# Patient Record
Sex: Female | Born: 1955 | Hispanic: No | Marital: Married | Smoking: Never smoker
Health system: Southern US, Community
[De-identification: ages and names within clinical notes are randomized; demographics above are authoritative.]

## PROBLEM LIST (undated history)

## (undated) DIAGNOSIS — K589 Irritable bowel syndrome without diarrhea: Secondary | ICD-10-CM

## (undated) DIAGNOSIS — Z87898 Personal history of other specified conditions: Secondary | ICD-10-CM

## (undated) DIAGNOSIS — R945 Abnormal results of liver function studies: Secondary | ICD-10-CM

## (undated) DIAGNOSIS — Z8639 Personal history of other endocrine, nutritional and metabolic disease: Secondary | ICD-10-CM

## (undated) DIAGNOSIS — R112 Nausea with vomiting, unspecified: Secondary | ICD-10-CM

## (undated) DIAGNOSIS — Z923 Personal history of irradiation: Secondary | ICD-10-CM

## (undated) DIAGNOSIS — Z8042 Family history of malignant neoplasm of prostate: Secondary | ICD-10-CM

## (undated) DIAGNOSIS — R7989 Other specified abnormal findings of blood chemistry: Secondary | ICD-10-CM

## (undated) DIAGNOSIS — T8859XA Other complications of anesthesia, initial encounter: Secondary | ICD-10-CM

## (undated) DIAGNOSIS — Z9889 Other specified postprocedural states: Secondary | ICD-10-CM

## (undated) DIAGNOSIS — Z8719 Personal history of other diseases of the digestive system: Secondary | ICD-10-CM

## (undated) DIAGNOSIS — Z8249 Family history of ischemic heart disease and other diseases of the circulatory system: Secondary | ICD-10-CM

## (undated) DIAGNOSIS — E890 Postprocedural hypothyroidism: Secondary | ICD-10-CM

## (undated) DIAGNOSIS — E7212 Methylenetetrahydrofolate reductase deficiency: Secondary | ICD-10-CM

## (undated) DIAGNOSIS — F419 Anxiety disorder, unspecified: Secondary | ICD-10-CM

## (undated) DIAGNOSIS — G43909 Migraine, unspecified, not intractable, without status migrainosus: Secondary | ICD-10-CM

## (undated) DIAGNOSIS — C801 Malignant (primary) neoplasm, unspecified: Secondary | ICD-10-CM

## (undated) DIAGNOSIS — Z1589 Genetic susceptibility to other disease: Secondary | ICD-10-CM

## (undated) DIAGNOSIS — M1990 Unspecified osteoarthritis, unspecified site: Secondary | ICD-10-CM

## (undated) DIAGNOSIS — G43019 Migraine without aura, intractable, without status migrainosus: Secondary | ICD-10-CM

## (undated) DIAGNOSIS — T4145XA Adverse effect of unspecified anesthetic, initial encounter: Secondary | ICD-10-CM

## (undated) DIAGNOSIS — Z85828 Personal history of other malignant neoplasm of skin: Secondary | ICD-10-CM

## (undated) DIAGNOSIS — Z8049 Family history of malignant neoplasm of other genital organs: Secondary | ICD-10-CM

## (undated) DIAGNOSIS — N950 Postmenopausal bleeding: Secondary | ICD-10-CM

## (undated) DIAGNOSIS — K5790 Diverticulosis of intestine, part unspecified, without perforation or abscess without bleeding: Secondary | ICD-10-CM

## (undated) HISTORY — DX: Migraine without aura, intractable, without status migrainosus: G43.019

## (undated) HISTORY — DX: Other specified abnormal findings of blood chemistry: R79.89

## (undated) HISTORY — PX: APPENDECTOMY: SHX54

## (undated) HISTORY — PX: OTHER SURGICAL HISTORY: SHX169

## (undated) HISTORY — DX: Family history of malignant neoplasm of other genital organs: Z80.49

## (undated) HISTORY — DX: Diverticulosis of intestine, part unspecified, without perforation or abscess without bleeding: K57.90

## (undated) HISTORY — PX: TUBAL LIGATION: SHX77

## (undated) HISTORY — DX: Abnormal results of liver function studies: R94.5

## (undated) HISTORY — DX: Methylenetetrahydrofolate reductase deficiency: E72.12

## (undated) HISTORY — DX: Family history of ischemic heart disease and other diseases of the circulatory system: Z82.49

## (undated) HISTORY — DX: Family history of malignant neoplasm of prostate: Z80.42

## (undated) HISTORY — PX: BREAST EXCISIONAL BIOPSY: SUR124

## (undated) HISTORY — DX: Unspecified osteoarthritis, unspecified site: M19.90

## (undated) HISTORY — DX: Genetic susceptibility to other disease: Z15.89

## (undated) HISTORY — DX: Irritable bowel syndrome, unspecified: K58.9

## (undated) HISTORY — DX: Anxiety disorder, unspecified: F41.9

---
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
## 1977-06-26 HISTORY — PX: BREAST EXCISIONAL BIOPSY: SUR124

## 1977-06-26 HISTORY — PX: BREAST BIOPSY: SHX20

## 1983-06-27 HISTORY — PX: OOPHORECTOMY: SHX86

## 1998-03-27 ENCOUNTER — Ambulatory Visit (HOSPITAL_COMMUNITY): Admission: RE | Admit: 1998-03-27 | Discharge: 1998-03-27 | Payer: Self-pay | Admitting: Neurosurgery

## 1998-03-27 ENCOUNTER — Encounter: Payer: Self-pay | Admitting: Neurosurgery

## 1999-04-12 ENCOUNTER — Other Ambulatory Visit: Admission: RE | Admit: 1999-04-12 | Discharge: 1999-04-12 | Payer: Self-pay | Admitting: *Deleted

## 2001-03-26 ENCOUNTER — Other Ambulatory Visit: Admission: RE | Admit: 2001-03-26 | Discharge: 2001-03-26 | Payer: Self-pay | Admitting: Obstetrics and Gynecology

## 2001-06-14 ENCOUNTER — Other Ambulatory Visit: Admission: RE | Admit: 2001-06-14 | Discharge: 2001-06-14 | Payer: Self-pay | Admitting: *Deleted

## 2002-06-06 ENCOUNTER — Other Ambulatory Visit: Admission: RE | Admit: 2002-06-06 | Discharge: 2002-06-06 | Payer: Self-pay | Admitting: Obstetrics and Gynecology

## 2002-07-23 ENCOUNTER — Encounter: Payer: Self-pay | Admitting: Family Medicine

## 2002-07-23 ENCOUNTER — Encounter: Admission: RE | Admit: 2002-07-23 | Discharge: 2002-07-23 | Payer: Self-pay | Admitting: Family Medicine

## 2002-12-18 ENCOUNTER — Encounter
Admission: RE | Admit: 2002-12-18 | Discharge: 2003-01-07 | Payer: Self-pay | Admitting: Physical Medicine and Rehabilitation

## 2003-03-26 ENCOUNTER — Ambulatory Visit (HOSPITAL_BASED_OUTPATIENT_CLINIC_OR_DEPARTMENT_OTHER): Admission: RE | Admit: 2003-03-26 | Discharge: 2003-03-26 | Payer: Self-pay | Admitting: Plastic Surgery

## 2003-03-26 ENCOUNTER — Ambulatory Visit (HOSPITAL_COMMUNITY): Admission: RE | Admit: 2003-03-26 | Discharge: 2003-03-26 | Payer: Self-pay | Admitting: Plastic Surgery

## 2003-03-26 ENCOUNTER — Encounter (INDEPENDENT_AMBULATORY_CARE_PROVIDER_SITE_OTHER): Payer: Self-pay | Admitting: Plastic Surgery

## 2003-04-21 ENCOUNTER — Other Ambulatory Visit: Admission: RE | Admit: 2003-04-21 | Discharge: 2003-04-21 | Payer: Self-pay | Admitting: Obstetrics and Gynecology

## 2004-03-30 ENCOUNTER — Encounter: Admission: RE | Admit: 2004-03-30 | Discharge: 2004-03-30 | Payer: Self-pay | Admitting: Family Medicine

## 2004-04-08 ENCOUNTER — Other Ambulatory Visit: Admission: RE | Admit: 2004-04-08 | Discharge: 2004-04-08 | Payer: Self-pay | Admitting: Obstetrics and Gynecology

## 2004-12-09 ENCOUNTER — Emergency Department (HOSPITAL_COMMUNITY): Admission: EM | Admit: 2004-12-09 | Discharge: 2004-12-09 | Payer: Self-pay | Admitting: Emergency Medicine

## 2005-01-18 ENCOUNTER — Encounter: Admission: RE | Admit: 2005-01-18 | Discharge: 2005-01-18 | Payer: Self-pay | Admitting: Unknown Physician Specialty

## 2005-02-21 ENCOUNTER — Ambulatory Visit: Payer: Self-pay | Admitting: Gastroenterology

## 2005-03-27 ENCOUNTER — Ambulatory Visit: Payer: Self-pay | Admitting: Gastroenterology

## 2005-04-03 ENCOUNTER — Ambulatory Visit: Payer: Self-pay | Admitting: Gastroenterology

## 2005-04-03 ENCOUNTER — Encounter (INDEPENDENT_AMBULATORY_CARE_PROVIDER_SITE_OTHER): Payer: Self-pay | Admitting: *Deleted

## 2005-04-06 ENCOUNTER — Ambulatory Visit (HOSPITAL_COMMUNITY): Admission: RE | Admit: 2005-04-06 | Discharge: 2005-04-06 | Payer: Self-pay | Admitting: Gastroenterology

## 2005-04-10 ENCOUNTER — Other Ambulatory Visit: Admission: RE | Admit: 2005-04-10 | Discharge: 2005-04-10 | Payer: Self-pay | Admitting: Obstetrics and Gynecology

## 2005-04-17 ENCOUNTER — Encounter: Admission: RE | Admit: 2005-04-17 | Discharge: 2005-04-17 | Payer: Self-pay | Admitting: Neurology

## 2005-04-21 ENCOUNTER — Ambulatory Visit: Payer: Self-pay | Admitting: Gastroenterology

## 2005-05-26 ENCOUNTER — Ambulatory Visit: Payer: Self-pay | Admitting: Gastroenterology

## 2005-06-28 ENCOUNTER — Encounter: Admission: RE | Admit: 2005-06-28 | Discharge: 2005-09-26 | Payer: Self-pay | Admitting: Neurology

## 2005-06-28 ENCOUNTER — Ambulatory Visit: Payer: Self-pay | Admitting: Psychology

## 2006-02-15 ENCOUNTER — Encounter: Admission: RE | Admit: 2006-02-15 | Discharge: 2006-02-15 | Payer: Self-pay | Admitting: Unknown Physician Specialty

## 2006-06-04 ENCOUNTER — Other Ambulatory Visit: Admission: RE | Admit: 2006-06-04 | Discharge: 2006-06-04 | Payer: Self-pay | Admitting: Obstetrics and Gynecology

## 2006-09-28 ENCOUNTER — Ambulatory Visit: Payer: Self-pay | Admitting: Gastroenterology

## 2006-09-28 LAB — CONVERTED CEMR LAB
Bacteria, UA: NEGATIVE
Crystals: NEGATIVE
Ketones, ur: NEGATIVE mg/dL
Leukocytes, UA: NEGATIVE
Mucus, UA: NEGATIVE
Urobilinogen, UA: 0.2 (ref 0.0–1.0)
WBC, UA: NONE SEEN cells/hpf

## 2007-04-26 ENCOUNTER — Other Ambulatory Visit: Admission: RE | Admit: 2007-04-26 | Discharge: 2007-04-26 | Payer: Self-pay | Admitting: Obstetrics and Gynecology

## 2008-05-01 ENCOUNTER — Other Ambulatory Visit: Admission: RE | Admit: 2008-05-01 | Discharge: 2008-05-01 | Payer: Self-pay | Admitting: Obstetrics and Gynecology

## 2009-05-26 HISTORY — PX: COLPOSCOPY: SHX161

## 2009-07-02 ENCOUNTER — Ambulatory Visit (HOSPITAL_BASED_OUTPATIENT_CLINIC_OR_DEPARTMENT_OTHER): Admission: RE | Admit: 2009-07-02 | Discharge: 2009-07-02 | Payer: Self-pay | Admitting: Family Medicine

## 2009-07-02 ENCOUNTER — Ambulatory Visit: Payer: Self-pay | Admitting: Interventional Radiology

## 2009-12-30 ENCOUNTER — Encounter
Admission: RE | Admit: 2009-12-30 | Discharge: 2010-03-21 | Payer: Self-pay | Admitting: Physical Medicine and Rehabilitation

## 2010-11-11 NOTE — Op Note (Signed)
   Kimberly Miles, Kimberly Miles                         ACCOUNT NO.:  [id number]   MEDICAL RECORD NO.:  [id number]                   PATIENT TYPE:  AMB   LOCATION:  DSC                                  FACILITY:  [facility]   PHYSICIAN:  Alfredia Ferguson, M.D.               DATE OF BIRTH:  05-24-1956   DATE OF PROCEDURE:  DATE OF DISCHARGE:                                 OPERATIVE REPORT   PREOPERATIVE DIAGNOSIS:  A 5-mm biopsy proven, basal cell carcinoma right  paranasal area.   POSTOPERATIVE DIAGNOSIS:  A 5-mm biopsy proven, basal cell carcinoma right  paranasal area.   PROCEDURE:  Elliptical excision basal cell carcinoma of the right paranasal  area with a 2-mm margin.   SURGEON:  Alfredia Ferguson, M.D.   ANESTHESIA:  2% Xylocaine with 1:100,000 epinephrine.   INDICATIONS FOR SURGERY:  This is a 55 year old woman with a biopsy proven  basal cell carcinoma located at the right paranasal area.  This is just  above the alar crease as it meets the cheek.  The patient wishes to have  this removed to clear the margins. She understands that she will be trading  what she has for permanent and potentially unsightly scar.  It was noted  that she wishes to proceed with the operation.   DESCRIPTION OF SURGERY:  An elliptical skin mark was placed around the  lesion with 2-mm margins.  Local anesthesia was infiltrated with 2%  Xylocaine 1:100,000 epinephrine.  After waiting approximately 10 minutes,  the right side of the nose and cheek were prepped with Betadine and draped  with sterile drapes.  An elliptical excision down  to the level of the  subcutaneous tissue was carried out.  The specimen was passed off for  pathology.  The wound edges were undermined for a distance of several  millimeters in all directions.   An attempt to place a 5-0 Monocryl in the central portion of the incision in  orderto remove the tension was made.  It was difficult to get the skin edges  to align well with the  buried suture since the anterior-most portion of the  nasal skin did not advance very well and tended to turn in.  For that  reason, I opted to place multiple interrupted 6-0 nylon sutures. The wound  aligned nicely.  The area was cleansed, dried, and a light dressing was  applied.  The patient was discharged home in satisfactory condition.                                               Alfredia Ferguson, M.D.    WBB/MEDQ  D:  03/26/2003  T:  03/26/2003  Job:  [id number]   cc:   Venancio Poisson, M.D.

## 2010-11-11 NOTE — Op Note (Signed)
[name], Kimberly Miles             ACCOUNT NO.:  [id number]   MEDICAL RECORD NO.:  [id number]          PATIENT TYPE:  AMB   LOCATION:  ENDO                         FACILITY:  [facility]   PHYSICIAN:  Vania Rea. Jarold Motto, M.D. [facility] OF BIRTH:  March 05, 1956   DATE OF PROCEDURE:  04/06/2005  DATE OF DISCHARGE:  04/06/2005                                 OPERATIVE REPORT   PROCEDURE:  Esophageal manometry.   SURGEON:  Vania Rea. Jarold Motto, M.D. [facility]   Esophageal manometry was completed on April 06, 2005 results are as  follows:  1.  Upper esophageal sphincter -- there appears to be good coordination      between pharyngeal contraction and cricopharyngeal relaxation.  2.  Lower esophageal sphincter -- mean pressure is normal at 25 mmHg with      normal relaxation to swallowing.  3.  Motility pattern -- there are normal propagated peristaltic waves      throughout the length of the esophageus to wet and dry swallows.  Mean      amplitude of contractions is slightly decreased to 41 mmHg.   ASSESSMENT:  This is a normal manometry except for slightly decreased  esophageal peristaltic amplitude.   RECOMMENDATIONS:  We will continue treatment for acid reflux and consider  Reglan trial because of her reduced esophageal peristalsis amplitude.           ______________________________  Vania Rea. Jarold Motto, M.D. [facility]     DRP/MEDQ  D:  04/21/2005  T:  04/22/2005  Job:  [id number]

## 2010-11-11 NOTE — Assessment & Plan Note (Signed)
[facility]                         GASTROENTEROLOGY OFFICE NOTE   NAME:Kimberly Miles, Kimberly Miles                    MRN:          [id number]  DATE:09/28/2006                            DOB:          [birth year]    Kimberly Miles is having recurrent episodes of right upper quadrant pain and  associated nausea for several years.  Most recent episode was several  weeks ago when she saw Dr. Derrell Lolling and had an ultrasound on September 12, 2006  which showed gallbladder sludge and multiple small stones, but,  otherwise, was unremarkable.   The patient has a very strong family history of gallbladder disease and  gallstones in both parents and both grandparents.  She said she does  have [place] genetics.  She really denies upper GI complaints at  this time, but I have seen her in the past for chronic functional  constipation.  She last had colonoscopy in October 2006 which was  unremarkable.  She had esophageal manometry which showed some decreased  esophageal peristaltic amplitude, but, otherwise, was normal without  evidence of aperistalsis.   PHYSICAL EXAMINATION:  Weighs 118 pounds, blood pressure 118/68, pulse  84 and regular.  I could not appreciate stigmata of chronic liver disease.  Her abdominal exam was unremarkable without palpable hepatosplenomegaly,  masses or significant tenderness.  Bowel sounds were normal.   ASSESSMENT:  Kimberly Miles has multiple symptomatic gallstones and needs  laparoscopic cholecystectomy which she has refused at this time.   RECOMMENDATIONS:  I placed her on Urso 250 mg a day.  Repeat her  ultrasound exam in three months time.  It is still my recommendation  that she have laparoscopic cholecystectomy.  I even showed her our  patient education movie on gallbladder disease and its management.  She  has elected to go as mentioned above, and I have explained to her that  is at high risk for having symptomatic cholecystitis or  cholangitis or  pancreatitis.     Kimberly Rea. Jarold Motto, MD, Caleen Essex, FAGA  Electronically Signed    DRP/MedQ  DD: 09/28/2006  DT: 09/28/2006  Job #: [id number]   cc:   Synetta Fail, M.D.

## 2010-12-07 ENCOUNTER — Other Ambulatory Visit: Payer: Self-pay | Admitting: Endocrinology

## 2010-12-07 DIAGNOSIS — R221 Localized swelling, mass and lump, neck: Secondary | ICD-10-CM

## 2010-12-09 ENCOUNTER — Ambulatory Visit
Admission: RE | Admit: 2010-12-09 | Discharge: 2010-12-09 | Disposition: A | Discharge status: Home / Self Care | Payer: BC Managed Care – PPO | Source: Ambulatory Visit | Attending: Endocrinology | Admitting: Endocrinology

## 2010-12-09 DIAGNOSIS — R221 Localized swelling, mass and lump, neck: Secondary | ICD-10-CM

## 2010-12-13 ENCOUNTER — Other Ambulatory Visit: Payer: Self-pay | Admitting: Endocrinology

## 2010-12-13 DIAGNOSIS — E041 Nontoxic single thyroid nodule: Secondary | ICD-10-CM

## 2010-12-20 ENCOUNTER — Ambulatory Visit
Admission: RE | Admit: 2010-12-20 | Discharge: 2010-12-20 | Disposition: A | Discharge status: Home / Self Care | Payer: BC Managed Care – PPO | Source: Ambulatory Visit | Attending: Endocrinology | Admitting: Endocrinology

## 2010-12-20 ENCOUNTER — Other Ambulatory Visit: Payer: Self-pay | Admitting: Interventional Radiology

## 2010-12-20 ENCOUNTER — Other Ambulatory Visit (HOSPITAL_COMMUNITY)
Admission: RE | Admit: 2010-12-20 | Discharge: 2010-12-20 | Disposition: A | Discharge status: Home / Self Care | Payer: BC Managed Care – PPO | Source: Ambulatory Visit | Attending: Interventional Radiology | Admitting: Interventional Radiology

## 2010-12-20 DIAGNOSIS — E041 Nontoxic single thyroid nodule: Secondary | ICD-10-CM

## 2010-12-20 DIAGNOSIS — E049 Nontoxic goiter, unspecified: Secondary | ICD-10-CM | POA: Insufficient documentation

## 2011-03-02 ENCOUNTER — Encounter (HOSPITAL_COMMUNITY)
Admission: RE | Admit: 2011-03-02 | Discharge: 2011-03-02 | Disposition: A | Discharge status: Home / Self Care | Payer: BC Managed Care – PPO | Source: Ambulatory Visit | Attending: Otolaryngology | Admitting: Otolaryngology

## 2011-03-02 LAB — URINALYSIS, ROUTINE W REFLEX MICROSCOPIC
Bilirubin Urine: NEGATIVE
Glucose, UA: NEGATIVE mg/dL
Hgb urine dipstick: NEGATIVE
Ketones, ur: NEGATIVE mg/dL
Leukocytes, UA: NEGATIVE
Nitrite: NEGATIVE
Protein, ur: NEGATIVE mg/dL
Specific Gravity, Urine: 1.011 (ref 1.005–1.030)
Urobilinogen, UA: 0.2 mg/dL (ref 0.0–1.0)
pH: 6 (ref 5.0–8.0)

## 2011-03-02 LAB — BASIC METABOLIC PANEL
BUN: 13 mg/dL (ref 6–23)
CO2: 32 mEq/L (ref 19–32)
Calcium: 9.9 mg/dL (ref 8.4–10.5)
Chloride: 104 mEq/L (ref 96–112)
Creatinine, Ser: 0.57 mg/dL (ref 0.50–1.10)
GFR calc Af Amer: >60 mL/min (ref >60)
GFR calc non Af Amer: >60 mL/min (ref >60)
Glucose, Bld: 82 mg/dL (ref 70–99)
Potassium: 5.1 mEq/L (ref 3.5–5.1)
Sodium: 140 mEq/L (ref 135–145)

## 2011-03-02 LAB — CBC
HCT: 41.7 % (ref 36.0–46.0)
Hemoglobin: 14.6 g/dL (ref 12.0–15.0)
MCH: 32.8 pg (ref 26.0–34.0)
MCHC: 35 g/dL (ref 30.0–36.0)
MCV: 93.7 fL (ref 78.0–100.0)
Platelets: 238 10*3/uL (ref 150–400)
RBC: 4.45 MIL/uL (ref 3.87–5.11)
RDW: 12.2 % (ref 11.5–15.5)
WBC: 6.8 10*3/uL (ref 4.0–10.5)

## 2011-03-02 LAB — SURGICAL PCR SCREEN
MRSA, PCR: NEGATIVE
Staphylococcus aureus: NEGATIVE

## 2011-03-02 LAB — HCG, SERUM, QUALITATIVE: Preg, Serum: NEGATIVE

## 2011-03-08 ENCOUNTER — Other Ambulatory Visit: Payer: Self-pay | Admitting: Otolaryngology

## 2011-03-08 ENCOUNTER — Ambulatory Visit (HOSPITAL_COMMUNITY)
Admission: RE | Admit: 2011-03-08 | Discharge: 2011-03-09 | Disposition: A | Discharge status: Home / Self Care | Payer: BC Managed Care – PPO | Source: Ambulatory Visit | Attending: Otolaryngology | Admitting: Otolaryngology

## 2011-03-08 DIAGNOSIS — D34 Benign neoplasm of thyroid gland: Secondary | ICD-10-CM | POA: Insufficient documentation

## 2011-03-08 DIAGNOSIS — K219 Gastro-esophageal reflux disease without esophagitis: Secondary | ICD-10-CM | POA: Insufficient documentation

## 2011-03-08 DIAGNOSIS — G43909 Migraine, unspecified, not intractable, without status migrainosus: Secondary | ICD-10-CM | POA: Insufficient documentation

## 2011-03-08 DIAGNOSIS — M1990 Unspecified osteoarthritis, unspecified site: Secondary | ICD-10-CM | POA: Insufficient documentation

## 2011-03-08 HISTORY — PX: THYROID LOBECTOMY: SHX420

## 2011-03-29 NOTE — Op Note (Signed)
  NAMEERANDY, MCEACHERN             ACCOUNT NO.:  [id number]  MEDICAL RECORD NO.:  [id number]  LOCATION:  SDS                          FACILITY:  [facility]  PHYSICIAN:  Meyer Dockery H. Pollyann Kennedy, MD     DATE OF BIRTH:  21-Mar-1956  DATE OF PROCEDURE:  03/08/2011 DATE OF DISCHARGE:  03/02/2011                              OPERATIVE REPORT   PREOPERATIVE DIAGNOSIS:  Right thyroid mass.  POSTOPERATIVE DIAGNOSIS:  Right thyroid mass.  PROCEDURE:  Right thyroid lobectomy.  SURGEON:  Kharlie Bring H. Pollyann Kennedy, MD  ASSISTANT:  Aquilla Hacker, [facility]  General endotracheal anesthesia was used.  No complications.  Blood loss minimal.  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  Permanent final diagnosis pending.  REFERRING PHYSICIAN:  Dorisann Frames, M.D.  HISTORY:  A 55 year old with a dominant nodule in the right thyroid lobe.  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  Risks, benefits, alternatives, complications of the procedure were explained to the patient, seemed understand, and agreed to surgery.  PROCEDURE:  The patient was taken to the operating room, placed in the operating table in supine position.  Following induction of general endotracheal anesthesia, the neck was prepped and draped in a standard fashion.  A low collar incision was outlined with a marking pen. Electrocautery was used to incise the subcutaneous tissue and through the platysma layer.  Subplatysmal flaps were developed superiorly to thyroid notch inferiorly to the clavicle.  A Weitlaner retractors was used throughout the case.  Midline fascia was divided and the strap muscles were reflected off the thyroid lobe laterally to the right side. The thyroid was retracted medially exposing the lateral capsule of the thyroid.  The superior vasculature was separately ligated between clamps and divided.  The middle thyroid vein was ligated between  clamps and divided as well.  4-0 silk ties were used throughout the case.  As the gland was brought forward a superior parathyroid was identified and preserved with its blood supply.  The recurrent nerve was identified and was left unmolested.  The inferior vasculature was then ligated between clamps and divided as well.  The ligament of Allyson Sabal was divided using electrocautery and silk ties as needed.  The isthmus was divided and sutured with chromic suture.  The specimen was delivered and sent for pathologic evaluation.  Frozen section as described above.  The wound was irrigated with bipolar cautery and additional ties were used as needed for completion of hemostasis.  The nerve was intact.  The superior parathyroid was healthy-looking.  A 7-French round JP drain was left in the wound, exited through the left-sided incision, secured in place with a nylon suture.  The midline fascia was reapproximated with chromic suture as was the platysma layer and the subcuticular layer.  Dermabond was used on the skin.  The patient was awakened, extubated, and transferred to recovery in stable condition.     Nil Bolser H. Pollyann Kennedy, MD     JHR/MEDQ  D:  03/08/2011  T:  03/08/2011  Job:  [id number]  cc:   Dorisann Frames, M.D.  Electronically Signed by Serena Colonel MD on 03/29/2011 09:31:03 PM

## 2011-05-09 HISTORY — PX: TRANSTHORACIC ECHOCARDIOGRAM: SHX275

## 2011-06-28 LAB — HM MAMMOGRAPHY: HM Mammogram: NEGATIVE

## 2011-06-29 ENCOUNTER — Telehealth: Payer: Self-pay | Admitting: Family Medicine

## 2011-07-27 ENCOUNTER — Encounter: Payer: Self-pay | Admitting: Family Medicine

## 2011-07-31 ENCOUNTER — Ambulatory Visit: Payer: BC Managed Care – PPO | Admitting: Family Medicine

## 2011-08-01 ENCOUNTER — Telehealth: Payer: Self-pay | Admitting: Family Medicine

## 2011-08-18 NOTE — Telephone Encounter (Signed)
Pt aware.

## 2012-07-16 ENCOUNTER — Other Ambulatory Visit: Payer: Self-pay | Admitting: Nurse Practitioner

## 2012-07-16 DIAGNOSIS — R748 Abnormal levels of other serum enzymes: Secondary | ICD-10-CM

## 2012-07-18 ENCOUNTER — Other Ambulatory Visit: Payer: Self-pay | Admitting: Dermatology

## 2012-07-22 ENCOUNTER — Ambulatory Visit
Admission: RE | Admit: 2012-07-22 | Discharge: 2012-07-22 | Disposition: A | Discharge status: Home / Self Care | Payer: BC Managed Care – PPO | Source: Ambulatory Visit | Attending: Nurse Practitioner | Admitting: Nurse Practitioner

## 2012-07-22 DIAGNOSIS — R748 Abnormal levels of other serum enzymes: Secondary | ICD-10-CM

## 2012-10-07 ENCOUNTER — Encounter: Payer: Self-pay | Admitting: *Deleted

## 2012-10-08 ENCOUNTER — Encounter: Payer: Self-pay | Admitting: Obstetrics and Gynecology

## 2012-10-08 ENCOUNTER — Ambulatory Visit (INDEPENDENT_AMBULATORY_CARE_PROVIDER_SITE_OTHER): Payer: BC Managed Care – PPO | Admitting: Obstetrics and Gynecology

## 2012-10-08 VITALS — BP 110/60 | Wt 118.0 lb

## 2012-10-08 DIAGNOSIS — N951 Menopausal and female climacteric states: Secondary | ICD-10-CM

## 2012-10-08 MED ORDER — ESTRADIOL ACETATE 0.05 MG/24HR VA RING: 1.0000 | VAGINAL_RING | VAGINAL | Status: DC

## 2012-10-08 NOTE — Patient Instructions (Signed)
We will call you in about a week with your results.  Then you should start the femring when I instruct you to.  Continue the prometrium every day.  Return here if you feel your symptoms don't improve on this therapy.  If they do, just return for your routine annual exam.

## 2012-10-08 NOTE — Progress Notes (Signed)
57 yo MWF G2P2 seen here August 28, 2012 for AnEx and had been started on HRT with estrogen pellets by Elie Goody for sx's of rage, low libido, fatigue, and hot flashes.  We spent quite a while then discussing the relative merits of alternative forms of HRT, and we ended up agreeing she would stop her current HRT, especially since it hadn't helped her sx/s anyway, and return here to check E2 level and consider FDA approved HRT.  We needed the E2 level because she had E2 pellets inserted in mid January, and I wanted to wait until her levels were back to baseline before I started HRT.  Pt hasn't really noticed any changes in her symptoms.  Still short tempered.  Pt feels HRT did indeed help calm the rages, and wants to go back on it at least for that.  Also helped hot flashes and helped her sleep better at night.  The HRT rx'd by Ms. Worrell was very expensive and the patient is eager to try something cheaper.    Previously used transdermal E2 but had a skin reaction and they came unstuck in her hot tub.  So pt wants to try femring 0.05 .  Will continue prometrium 100 mg as she is doing now.  Fully instructed.  Also asks for Vit D level today.  Taking 10,000 units qd of D3.  Rx for femring 0.05 mg q3months with 3 rf.  Continue prometrium.  Will call pt with lab results and instructions as to whether her E2 levels are down enough to start the femring.  Then ok for routine F/U.

## 2012-10-09 LAB — VITAMIN D 25 HYDROXY (VIT D DEFICIENCY, FRACTURES): Vit D, 25-Hydroxy: 73 ng/mL (ref 30–89)

## 2013-02-21 ENCOUNTER — Encounter: Payer: Self-pay | Admitting: Cardiovascular Disease

## 2013-02-21 ENCOUNTER — Ambulatory Visit (INDEPENDENT_AMBULATORY_CARE_PROVIDER_SITE_OTHER): Payer: BC Managed Care – PPO | Admitting: Cardiovascular Disease

## 2013-02-21 VITALS — BP 118/80 | HR 65 | Ht 59.0 in | Wt 117.4 lb

## 2013-02-21 DIAGNOSIS — R002 Palpitations: Secondary | ICD-10-CM

## 2013-02-21 DIAGNOSIS — Z8669 Personal history of other diseases of the nervous system and sense organs: Secondary | ICD-10-CM

## 2013-02-21 DIAGNOSIS — R946 Abnormal results of thyroid function studies: Secondary | ICD-10-CM | POA: Insufficient documentation

## 2013-02-21 DIAGNOSIS — E785 Hyperlipidemia, unspecified: Secondary | ICD-10-CM | POA: Insufficient documentation

## 2013-02-21 DIAGNOSIS — E782 Mixed hyperlipidemia: Secondary | ICD-10-CM

## 2013-02-21 MED ORDER — METOPROLOL TARTRATE 25 MG PO TABS: 25.0000 mg | ORAL_TABLET | ORAL | Status: DC | PRN

## 2013-02-21 NOTE — Patient Instructions (Signed)
Your physician has recommended that you wear a holter monitor. Holter monitors are medical devices that record the heart's electrical activity. Doctors most often use these monitors to diagnose arrhythmias. Arrhythmias are problems with the speed or rhythm of the heartbeat. The monitor is a small, portable device. You can wear one while you do your normal daily activities. This is usually used to diagnose what is causing palpitations/syncope (passing out).  Your physician recommends that you return for lab work fasting. You will not need an appointment. The lab opens at 8:00 a.m. They see patients on a first come , first serve basis.  Your physician has recommended you make the following change in your medication: START prescription for lopressor as directed on the bottle. This has already been sent to your pharmacy.  Your physician recommends that you schedule a follow-up appointment in: 3-4 weeks.

## 2013-02-21 NOTE — Progress Notes (Signed)
Patient ID: Kimberly Miles, female   DOB: 10/09/1955, 57 y.o.   MRN: [id number]     HPI: Kimberly Miles, is a 57 y.o. female who presents to the office today for one-year cardiology evaluation.  His [place] has a history of palpitations. 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 In the past she also has a history of elevation of liver function studies as well as hyperlipidemia. She apparently has been taking Armour Thyroid. Recently, she has noticed episodes of palpitations. These seem to have been occurring more frequently over the past 2 months. Often times she notes these in the morning after she wakes up. She describes them as skips but at times they can be several erosions. Alleviator mostly before noon. She drinks one cup of coffee. She denies associated chest pressure or presyncope or syncope. She has not had her thyroid function studies checked in some time. She presents for evaluation.  Past Medical History  Diagnosis Date  . Migraines 1998  . IBS (irritable bowel syndrome)   . Diverticulosis   . Colitis 1980    Past Surgical History  Procedure Laterality Date  . Breast biopsy  1979    benign  . Colposcopy  05/2009    CIN 1  . Cesarean section  1977, 1978  . Oophorectomy  1985    ruptured cyst   . Appendectomy    . Thyroidectomy, partial      Allergies  Allergen Reactions  . Ampicillin Rash    Current Outpatient Prescriptions  Medication Sig Dispense Refill  . 5-Methyltetrahydrofolate Calc POWD       . Estradiol Acetate 0.05 MG/24HR RING Place 1 each vaginally every 3 (three) months.  1 each  3  . IODINE, KELP, PO Apply 3 drops topically daily.      . magnesium oxide (MAG-OX) 400 MG tablet Take 400 mg by mouth daily.      . progesterone (PROMETRIUM) 100 MG capsule Take 100 mg by mouth daily.      . rizatriptan (MAXALT) 10 MG tablet Take 10 mg by mouth as  needed for migraine. May repeat in 2 hours if needed      . thyroid (ARMOUR) 90 MG tablet Take 90 mg by mouth daily.      . metoprolol tartrate (LOPRESSOR) 25 MG tablet Take 1 tablet (25 mg total) by mouth as needed.  30 tablet  3   No current facility-administered medications for this visit.    History   Social History  . Marital Status: Married    Spouse Name: N/A    Number of Children: N/A  . Years of Education: N/A   Occupational History  . Not on file.   Social History Main Topics  . Smoking status: Never Smoker   . Smokeless tobacco: Not on file  . Alcohol Use: 0.5 oz/week    1 drink(s) per week     Comment: occ alcohol wine  . Drug Use: No  . Sexual Activity: Yes    Partners: Male    Birth Control/ Protection: Post-menopausal   Other Topics Concern  . Not on file   Social History Narrative  . No narrative on file    Family history is notable that both parents are living. Mother has a history of lung cancer as well as hypertension hyperlipidemia and kidney issues. Father has heart disease.  ROS is negative  for fevers, chills or night sweats. She missed palpitations. She denies chest pressure. She denies wheezing. She does have migraines and takes Maxalt as needed. She denies abdominal pain. She denies change in bowel or bladder habits. She denies constipation nausea vomiting diarrhea. She denies myalgias. She denies paresthesias. Other system review is negative.  PE BP 118/80  Pulse 65  Ht 4\' 11"  (1.499 m)  Wt 117 lb 6.4 oz (53.252 kg)  BMI 23.7 kg/m2  LMP 08/25/2010  General: Alert, oriented, no distress.  Skin: normal turgor, no rashes HEENT: Normocephalic, atraumatic. Pupils round and reactive; sclera anicteric;no lid lag.  Nose without nasal septal hypertrophy Mouth/Parynx benign; Mallinpatti scale 2 Neck: No JVD, no carotid briuts Lungs: clear to ausculatation and percussion; no wheezing or rales Heart: RRR, s1 s2 normal; faint 1/6 systolic  murmur Abdomen: soft, nontender; no hepatosplenomehaly, BS+; abdominal aorta nontender and not dilated by palpation. Pulses 2+ Extremities: no clubbing cyanosis or edema, Homan's sign negative  Neurologic: grossly nonfocal  ECG: Sinus rhythm at 65 beats per minute. No ectopy. QTc interval 424 msec  LABS:  BMET    Component Value Date/Time   NA 140 03/02/2011 1102   K 5.1 03/02/2011 1102   CL 104 03/02/2011 1102   CO2 32 03/02/2011 1102   GLUCOSE 82 03/02/2011 1102   BUN 13 03/02/2011 1102   CREATININE 0.57 03/02/2011 1102   CALCIUM 9.9 03/02/2011 1102   GFRNONAA >60 03/02/2011 1102   GFRAA >60 03/02/2011 1102     Hepatic Function Panel  No results found for this basename: prot, albumin, ast, alt, alkphos, bilitot, bilidir, ibili     CBC    Component Value Date/Time   WBC 6.8 03/02/2011 1102   RBC 4.45 03/02/2011 1102   HGB 14.6 03/02/2011 1102   HCT 41.7 03/02/2011 1102   PLT 238 03/02/2011 1102   MCV 93.7 03/02/2011 1102   MCH 32.8 03/02/2011 1102   MCHC 35.0 03/02/2011 1102   RDW 12.2 03/02/2011 1102     BNP No results found for this basename: probnp    Lipid Panel  No results found for this basename: chol, trig, hdl, cholhdl, vldl, ldlcalc     RADIOLOGY: No results found.    ASSESSMENT AND PLAN: Ms. Arciniega is a very pleasant 57 year old female who does have a history of thyroid abnormality and is status post partial thyroidectomy and has been on thyroid replacement therapy early with Synthroid and most recently with Armour thyroid she does note recent increase in palpitations for the past 2 months. She denies associated syncope. Presently on checking a complete set of laboratory in the fasting state. We'll also check a magnesium as well as TSHT4 and T3. I'm scheduling her for 48 hour event monitor. I'm giving her a prescription for metoprolol tartrate 25 mg to take on an as-needed basis. She did have an echo Doppler study in November 2012 and this again was reviewed today. She did have  normal systolic and diastolic function at that time and had normal chamber dimensions. I will see her back in the office in followup of the above studies and further recommendations will be made at that time.     Lennette Bihari, MD, [facility]  02/21/2013 2:52 PM

## 2013-02-25 ENCOUNTER — Ambulatory Visit: Payer: BC Managed Care – PPO | Admitting: *Deleted

## 2013-02-25 DIAGNOSIS — R002 Palpitations: Secondary | ICD-10-CM

## 2013-02-25 LAB — COMPREHENSIVE METABOLIC PANEL
ALT: 41 U/L — ABNORMAL HIGH (ref 0–35)
AST: 21 U/L (ref 0–37)
Albumin: 4.5 g/dL (ref 3.5–5.2)
Alkaline Phosphatase: 80 U/L (ref 39–117)
Glucose, Bld: 89 mg/dL (ref 70–99)
Potassium: 4 mEq/L (ref 3.5–5.3)
Sodium: 139 mEq/L (ref 135–145)
Total Bilirubin: 0.6 mg/dL (ref 0.3–1.2)
Total Protein: 6.3 g/dL (ref 6.0–8.3)

## 2013-02-25 LAB — TSH: TSH: 0.169 u[IU]/mL — ABNORMAL LOW (ref 0.350–4.500)

## 2013-02-25 LAB — CBC
MCH: 31.7 pg (ref 26.0–34.0)
MCHC: 34.4 g/dL (ref 30.0–36.0)
MCV: 92 fL (ref 78.0–100.0)
Platelets: 242 10*3/uL (ref 150–400)
RBC: 4.61 MIL/uL (ref 3.87–5.11)
RDW: 13.6 % (ref 11.5–15.5)

## 2013-02-25 LAB — LIPID PANEL: Cholesterol: 219 mg/dL — ABNORMAL HIGH (ref 0–200)

## 2013-02-25 LAB — T3, FREE: T3, Free: 3 pg/mL (ref 2.3–4.2)

## 2013-02-25 LAB — MAGNESIUM: Magnesium: 2 mg/dL (ref 1.5–2.5)

## 2013-02-25 NOTE — Progress Notes (Signed)
Patient received a 48 hour holter monitor. Patient voiced understanding of verbal instructions given. Will follow up with Dr.Kelly as scheduled.

## 2013-03-10 ENCOUNTER — Encounter: Payer: Self-pay | Admitting: Cardiovascular Disease

## 2013-03-11 ENCOUNTER — Ambulatory Visit (INDEPENDENT_AMBULATORY_CARE_PROVIDER_SITE_OTHER): Payer: BC Managed Care – PPO | Admitting: Cardiovascular Disease

## 2013-03-11 ENCOUNTER — Encounter: Payer: Self-pay | Admitting: Cardiovascular Disease

## 2013-03-11 VITALS — BP 110/70 | HR 76 | Ht 59.0 in | Wt 119.0 lb

## 2013-03-11 DIAGNOSIS — R002 Palpitations: Secondary | ICD-10-CM

## 2013-03-11 DIAGNOSIS — E785 Hyperlipidemia, unspecified: Secondary | ICD-10-CM

## 2013-03-11 DIAGNOSIS — R946 Abnormal results of thyroid function studies: Secondary | ICD-10-CM

## 2013-03-11 MED ORDER — THYROID 60 MG PO TABS: 90.0000 mg | ORAL_TABLET | Freq: Every day | ORAL | Status: DC

## 2013-03-11 NOTE — Patient Instructions (Addendum)
Your physician has recommended you make the following change in your medication: reduce your thyroid medication as instructed then follow up with your PCP.  Your physician recommends that you schedule a follow-up appointment in: 6 MONTHS TO 1 YEAR.

## 2013-03-13 NOTE — Progress Notes (Signed)
Patient ID: Kimberly Miles, female   DOB: [birth year], 57 y.o.   MRN: [id number]     HPI: Kimberly Miles, is a 57 y.o. female who presents to the office today for followup cardiology evaluation. I last saw her approximately 3 weeks ago.  His Kuhnert has a history of palpitations. 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 In the past she also has a history of elevation of liver function studies as well as hyperlipidemia. She apparently has been taking Armour Thyroid. Recently, she has noticed episodes of palpitations. These seem to have been occurring more frequently over the past 2 months. Often times she notes these in the morning after she wakes up. She describes them as skips.  She drinks one cup of coffee. She denies associated chest pressure or presyncope or syncope. She has not had her thyroid function studies checked in some time.   When I saw her 3 weeks ago, I recommended that she discontinue her caffeine intact. Laboratory was checked which revealed a potassium of 4.0. She did have a BUN of 9 creatinine 0.69. Her ALT was minimally increased at 41 and 11 liver function studies were normal. Hemoglobin was 14.6 hematocrit 42.4. TSH was over suppressed at 0.169. Free T3 was 3.0 and free T4 0.83. She did wear a 48 hour Holter monitor which showed a minimum heart rate of 46, and average heart rate at 70 beats per minute and maximal heart rate of 129 beats per minute the she was noted to have occasional to frequent PVCs predominantly single but she did have episodes of bigeminy trigeminy and quadrigeminy. These seem most prominent she was sleeping the she denies any awareness of sleep apnea. She denies snoring. Recently, she has been taking Armour Thyroid at a dose of 90 mg she states in the past this had been at a dose of 60 mg but had been increased by Dr. Reita Chard at [facility].  Past Medical History  Diagnosis Date  . Migraines 1998  . IBS (irritable bowel syndrome)   . Diverticulosis   . Colitis 1980  . Palpitations     Past Surgical History  Procedure Laterality Date  . Breast biopsy  1979    benign  . Colposcopy  05/2009    CIN 1  . Cesarean section  1977, 1978  . Oophorectomy  1985    ruptured cyst   . Appendectomy    . Thyroidectomy, partial    . Myocardial perfusion study  04/07/2003    Normal Cardiolite   . 2d echocardiogram  05/09/2011    EF >55%, normal-mild    Allergies  Allergen Reactions  . Ampicillin Rash    Current Outpatient Prescriptions  Medication Sig Dispense Refill  . 5-Methyltetrahydrofolate Calc POWD       . Estradiol Acetate 0.05 MG/24HR RING Place 1 each vaginally every 3 (three) months.  1 each  3  . IODINE, KELP, PO Apply 3 drops topically daily.      . magnesium oxide (MAG-OX) 400 MG tablet Take 400 mg by mouth daily.      . metoprolol tartrate (LOPRESSOR) 25 MG tablet Take 1 tablet (25 mg total) by mouth as needed.  30 tablet  3  . progesterone (PROMETRIUM) 100 MG capsule Take 100 mg by mouth daily.      . rizatriptan (MAXALT) 10 MG tablet Take 10 mg  by mouth as needed for migraine. May repeat in 2 hours if needed      . thyroid (ARMOUR) 60 MG tablet Take 1.5 tablets (90 mg total) by mouth daily.  90 tablet  2   No current facility-administered medications for this visit.    History   Social History  . Marital Status: Married    Spouse Name: N/A    Number of Children: N/A  . Years of Education: N/A   Occupational History  . Not on file.   Social History Main Topics  . Smoking status: Never Smoker   . Smokeless tobacco: Not on file  . Alcohol Use: 0.5 oz/week    1 drink(s) per week     Comment: occ alcohol wine  . Drug Use: No  . Sexual Activity: Yes    Partners: Male    Birth Control/ Protection: Post-menopausal   Other Topics Concern  . Not on file   Social History Narrative  . No  narrative on file    Family history is notable that both parents are living. Mother has a history of lung cancer as well as hypertension hyperlipidemia and kidney issues. Father has heart disease.  ROS is negative for fevers, chills or night sweats. She missed palpitations. She denies chest pressure. She denies wheezing. She does have migraines and takes Maxalt as needed. She denies abdominal pain. She denies change in bowel or bladder habits. She denies constipation nausea vomiting diarrhea. She denies myalgias. She denies paresthesias. Other system review is negative.  PE BP 110/70  Pulse 76  Ht 4\' 11"  (1.499 m)  Wt 119 lb (53.978 kg)  BMI 24.02 kg/m2  LMP 08/25/2010  General: Alert, oriented, no distress.  Skin: normal turgor, no rashes HEENT: Normocephalic, atraumatic. Pupils round and reactive; sclera anicteric;no lid lag.  Nose without nasal septal hypertrophy Mouth/Parynx benign; Mallinpatti scale 2 Neck: No JVD, no carotid briuts Lungs: clear to ausculatation and percussion; no wheezing or rales Heart: RRR, s1 s2 normal; faint 1/6 systolic murmur Abdomen: soft, nontender; no hepatosplenomehaly, BS+; abdominal aorta nontender and not dilated by palpation. Pulses 2+ Extremities: no clubbing cyanosis or edema, Homan's sign negative  Neurologic: grossly nonfocal; no tremors Psychologic: Normal affect and mood.  ECG: Normal sinus rhythm at 76 beats per minute. PR interval 148 ms. QTc interval 441 milliseconds  LABS:  BMET    Component Value Date/Time   NA 139 02/25/2013 0938   K 4.0 02/25/2013 0938   CL 105 02/25/2013 0938   CO2 26 02/25/2013 0938   GLUCOSE 89 02/25/2013 0938   BUN 9 02/25/2013 0938   CREATININE 0.69 02/25/2013 0938   CREATININE 0.57 03/02/2011 1102   CALCIUM 9.2 02/25/2013 0938   GFRNONAA >60 03/02/2011 1102   GFRAA >60 03/02/2011 1102     Hepatic Function Panel     Component Value Date/Time   PROT 6.3 02/25/2013 0938     CBC    Component Value Date/Time   WBC  7.2 02/25/2013 0938   RBC 4.61 02/25/2013 0938   HGB 14.6 02/25/2013 0938   HCT 42.4 02/25/2013 0938   PLT 242 02/25/2013 0938   MCV 92.0 02/25/2013 0938   MCH 31.7 02/25/2013 0938   MCHC 34.4 02/25/2013 0938   RDW 13.6 02/25/2013 0938     BNP No results found for this basename: probnp    Lipid Panel     Component Value Date/Time   CHOL 219* 02/25/2013 0938     RADIOLOGY: No results  found.    ASSESSMENT AND PLAN: Ms. Geng is a very pleasant 57 year old female who does have a history of thyroid abnormality and is status post partial thyroidectomy and has been on thyroid replacement therapy.  An echo Doppler study in November 2012 revealed normal systolic and diastolic function at that time and had normal chamber dimensions. Her Holter monitor study demonstrates palpitations due to PVCs. These occurred in isolated, as well is in a bigeminal, trigeminal, and rarely a quadrigeminal pattern. She is over suppressed with reference to her thyroid function and has been taking Armour Thyroid 90 mg. In the past, she states that she had been on 60 mg but was increased to this 90 dose. I suggested that she reduce her thyroid replacement to 60 mg or impossible to release 75 mg. I recommended that she see Elie Goody for followup laboratory and further adjustment of her thyroid medication. I suspect this is contributing to her palpitations. She does have a prescription for metoprolol tartrate to take an as needed basis., I will see her in 6 months for followup cardiology evaluation.     Lennette Bihari, MD, [facility]  03/13/2013 5:34 PM

## 2013-04-28 ENCOUNTER — Telehealth: Payer: Self-pay | Admitting: Gynecology

## 2013-04-28 NOTE — Telephone Encounter (Signed)
Spoke with pt to advise on estring. Pt will try stopping it to see if it helps with palpitations. Pt reports she also is taking 100 mg of progesterone currently. Does she need to stop it if she discontinues the estring? If so, does she need to wean off?

## 2013-04-28 NOTE — Telephone Encounter (Signed)
.  Pt would like to talk with the nurse concerning her hormones

## 2013-04-28 NOTE — Telephone Encounter (Signed)
It looks like she is only on the estring as hormone, there is essentially no blood level so it should not cause her palpitations, but she can just take it out if she desires, there is no taper required

## 2013-04-28 NOTE — Telephone Encounter (Signed)
Spoke with patient. She is currently being followed by cardiology and having heart palpitations. She feels that it may be related to use of estrogen/progesterone as she was started on by Dr. Tresa Res in May 2014.   She would like to stop using Estradiol ring as she was supposed to replace ring on 04/26/13. She would like advise on how to taper off hormones as she feels they are r/t to her palpitations. She prefers not to wait until an office visit for instructions as she will be out of town and does not want to replace estradiol ring.

## 2013-04-29 NOTE — Telephone Encounter (Signed)
Pt advised re: stopping progesterone. Pt will call with any problems.

## 2013-04-29 NOTE — Telephone Encounter (Signed)
Can just stop that as well

## 2013-07-11 ENCOUNTER — Ambulatory Visit (INDEPENDENT_AMBULATORY_CARE_PROVIDER_SITE_OTHER): Payer: BC Managed Care – PPO | Admitting: Gynecology

## 2013-07-11 ENCOUNTER — Ambulatory Visit: Payer: Self-pay | Admitting: Obstetrics and Gynecology

## 2013-07-11 ENCOUNTER — Encounter: Payer: Self-pay | Admitting: Gynecology

## 2013-07-11 VITALS — BP 116/64 | HR 70 | Resp 18 | Ht 58.5 in | Wt 121.0 lb

## 2013-07-11 DIAGNOSIS — Z01419 Encounter for gynecological examination (general) (routine) without abnormal findings: Secondary | ICD-10-CM

## 2013-07-11 DIAGNOSIS — N951 Menopausal and female climacteric states: Secondary | ICD-10-CM

## 2013-07-11 DIAGNOSIS — Z124 Encounter for screening for malignant neoplasm of cervix: Secondary | ICD-10-CM

## 2013-07-11 DIAGNOSIS — Z13820 Encounter for screening for osteoporosis: Secondary | ICD-10-CM

## 2013-07-11 DIAGNOSIS — G43009 Migraine without aura, not intractable, without status migrainosus: Secondary | ICD-10-CM

## 2013-07-11 DIAGNOSIS — Z23 Encounter for immunization: Secondary | ICD-10-CM

## 2013-07-11 DIAGNOSIS — Z0000 Encounter for general adult medical examination without abnormal findings: Secondary | ICD-10-CM

## 2013-07-11 LAB — POCT URINALYSIS DIPSTICK
Blood, UA: 2
Leukocytes, UA: NEGATIVE
Urobilinogen, UA: NEGATIVE
pH, UA: 5

## 2013-07-11 LAB — HEMOGLOBIN, FINGERSTICK: Hemoglobin, fingerstick: 13.7 g/dL (ref 12.0–16.0)

## 2013-07-11 MED ORDER — PROMETHAZINE HCL 25 MG RE SUPP: 25.0000 mg | Freq: Four times a day (QID) | RECTAL | Status: DC | PRN

## 2013-07-11 MED ORDER — RIZATRIPTAN BENZOATE 10 MG PO TABS: 10.0000 mg | ORAL_TABLET | ORAL | Status: DC | PRN

## 2013-07-11 MED ORDER — ONDANSETRON 4 MG PO TBDP: 4.0000 mg | ORAL_TABLET | Freq: Three times a day (TID) | ORAL | Status: DC | PRN

## 2013-07-11 MED ORDER — CONJ ESTROGENS-BAZEDOXIFENE 0.45-20 MG PO TABS: 1.0000 | ORAL_TABLET | ORAL | Status: DC

## 2013-07-11 MED ORDER — ZOLPIDEM TARTRATE 5 MG PO TABS: 5.0000 mg | ORAL_TABLET | Freq: Every evening | ORAL | Status: DC | PRN

## 2013-07-11 NOTE — Patient Instructions (Signed)

## 2013-07-11 NOTE — Progress Notes (Signed)
58 y.o. Married Caucasian female   G2P2 here for annual exam. Pt reports menses are absent.  She does report hot flashes, does have night sweats, does have vaginal dryness.  She is using lubricants, OTC.  She does not report post-menopasual bleeding.  Pt stopped femring due to palpitations, stopped 04/26/13, symptoms did not resolve, but now with menopausal symptoms, hot flashes, night sweats and poor sleep.  Migraines are more frequent, lasting 2-3d each, sometimes associated with emesis.  Patient's last menstrual period was 08/25/2010.          Sexually active: yes  The current method of family planning is tubal ligation and post menopausal status.    Exercising: yes  walking, tennis Last pap: 07/06/10 Negative  Abnormal PAP: yes Mammogram: 12/19/12 Bi-Rads 1 BSE: yes Colonoscopy: over 10 years ago DEXA: 2008-osteopenic -1.7 Alcohol: no Tobacco: no   Hgb: 13.7 ; Urine: Blood 2  Health Maintenance  Topic Date Due  . Tetanus/tdap  08/21/1972  . Influenza Vaccine  01/24/2013  . Pap Smear  07/06/2013  . Colonoscopy  10/08/2014  . Mammogram  12/20/2014    Family History  Problem Relation Age of Onset  . Cancer Mother 12    Lung cancer  . Hypertension Mother 1  . Hyperlipidemia Mother 42  . Hypertension Maternal Grandmother   . Cancer Maternal Grandmother     Uterine cancer  . Vascular Disease Maternal Grandmother   . Stroke Maternal Grandfather   . Cancer Paternal Grandfather     Prostate cancer    Patient Active Problem List   Diagnosis Date Noted  . Palpitations 02/21/2013  . History of migraine headaches 02/21/2013  . Hyperlipidemia 02/21/2013  . Thyroid function study abnormality 02/21/2013    Past Medical History  Diagnosis Date  . Migraines 1998  . IBS (irritable bowel syndrome)   . Diverticulosis   . Colitis 1980  . Palpitations     Past Surgical History  Procedure Laterality Date  . Breast biopsy  1979    benign  . Colposcopy  05/2009    CIN 1  .  Cesarean section  1977, 1978  . Oophorectomy  1985    ruptured cyst   . Appendectomy    . Thyroidectomy, partial    . Myocardial perfusion study  04/07/2003    Normal Cardiolite   . 2d echocardiogram  05/09/2011    EF >55%, normal-mild    Allergies: Ampicillin  Current Outpatient Prescriptions  Medication Sig Dispense Refill  . 5-Methyltetrahydrofolate Calc POWD       . Estradiol Acetate 0.05 MG/24HR RING Place 1 each vaginally every 3 (three) months.  1 each  3  . IODINE, KELP, PO Apply 3 drops topically daily.      . magnesium oxide (MAG-OX) 400 MG tablet Take 400 mg by mouth daily.      . metoprolol tartrate (LOPRESSOR) 25 MG tablet Take 1 tablet (25 mg total) by mouth as needed.  30 tablet  3  . progesterone (PROMETRIUM) 100 MG capsule Take 100 mg by mouth daily.      . rizatriptan (MAXALT) 10 MG tablet Take 10 mg by mouth as needed for migraine. May repeat in 2 hours if needed      . thyroid (ARMOUR) 60 MG tablet Take 1.5 tablets (90 mg total) by mouth daily.  90 tablet  2   No current facility-administered medications for this visit.    ROS: Pertinent items are noted in HPI.  Exam:  BP 116/64  Pulse 70  Resp 18  Ht 4' 10.5" (1.486 m)  Wt 121 lb (54.885 kg)  BMI 24.86 kg/m2  LMP 08/25/2010 Weight change: @WEIGHTCHANGE @ Last 3 height recordings:  Ht Readings from Last 3 Encounters:  07/11/13 4' 10.5" (1.486 m)  03/11/13 4\' 11"  (1.499 m)  02/21/13 4\' 11"  (1.499 m)   General appearance: alert, cooperative and appears stated age Head: Normocephalic, without obvious abnormality, atraumatic Neck: no adenopathy, no carotid bruit, no JVD, supple, symmetrical, trachea midline and thyroid not enlarged, symmetric, no tenderness/mass/nodules Lungs: clear to auscultation bilaterally Breasts: normal appearance, no masses or tenderness Heart: regular rate and rhythm, S1, S2 normal, no murmur, click, rub or gallop Abdomen: soft, non-tender; bowel sounds normal; no masses,  no  organomegaly Extremities: extremities normal, atraumatic, no cyanosis or edema Skin: Skin color, texture, turgor normal. No rashes or lesions Lymph nodes: Cervical, supraclavicular, and axillary nodes normal. no inguinal nodes palpated Neurologic: Grossly normal   Pelvic: External genitalia:  no lesions              Urethra: normal appearing urethra with no masses, tenderness or lesions              Bartholins and Skenes: normal                 Vagina: normal appearing vagina with normal color and discharge, no lesions              Cervix: normal appearance              Pap taken: yes        Bimanual Exam:  Uterus:  uterus is normal size, shape, consistency and nontender                                      Adnexa:    normal adnexa in size, nontender and no masses                                      Rectovaginal: Confirms                                      Anus:  normal sphincter tone, no lesions  A: well woman no contraindication to continue hormonal therapy Migraine without aura but emesis Screening for osteoporosis     P: mammogram annual pap smear with HRHPV, guidelines reviewed DEXA Due for tetanus Reviewed findings of [facility] and nurses study-possible risks of CVD, benefits to bones, cholesterol possible decrease in breast and colon cancers, questions addressed, pt would like to try [facility] Medications for migraines and nausea given counseled on breast self exam, mammography screening, use and side effects of HRT, osteoporosis, adequate intake of calcium and vitamin D, diet and exercise return annually or prn Discussed PAP guideline changes, importance of weight bearing exercises, calcium, vit D and balanced diet.  An After Visit Summary was printed and given to the patient.

## 2013-07-14 MED ORDER — ZOLPIDEM TARTRATE 5 MG PO TABS: 5.0000 mg | ORAL_TABLET | Freq: Every evening | ORAL | Status: DC | PRN

## 2013-07-15 LAB — IPS PAP TEST WITH HPV

## 2013-09-01 ENCOUNTER — Encounter: Payer: Self-pay | Admitting: Cardiology

## 2013-09-01 ENCOUNTER — Ambulatory Visit (INDEPENDENT_AMBULATORY_CARE_PROVIDER_SITE_OTHER): Payer: BC Managed Care – PPO | Admitting: Cardiology

## 2013-09-01 VITALS — BP 100/62 | Ht 59.0 in | Wt 121.0 lb

## 2013-09-01 DIAGNOSIS — E785 Hyperlipidemia, unspecified: Secondary | ICD-10-CM

## 2013-09-01 DIAGNOSIS — R002 Palpitations: Secondary | ICD-10-CM

## 2013-09-01 DIAGNOSIS — R0989 Other specified symptoms and signs involving the circulatory and respiratory systems: Secondary | ICD-10-CM

## 2013-09-01 DIAGNOSIS — Z8249 Family history of ischemic heart disease and other diseases of the circulatory system: Secondary | ICD-10-CM | POA: Insufficient documentation

## 2013-09-01 HISTORY — DX: Family history of ischemic heart disease and other diseases of the circulatory system: Z82.49

## 2013-09-01 LAB — CBC
HCT: 43.7 % (ref 36.0–46.0)
Hemoglobin: 15 g/dL (ref 12.0–15.0)
MCH: 31.7 pg (ref 26.0–34.0)
MCHC: 34.3 g/dL (ref 30.0–36.0)
MCV: 92.4 fL (ref 78.0–100.0)
PLATELETS: 222 10*3/uL (ref 150–400)
RBC: 4.73 MIL/uL (ref 3.87–5.11)
RDW: 13.1 % (ref 11.5–15.5)
WBC: 5.8 10*3/uL (ref 4.0–10.5)

## 2013-09-01 LAB — BASIC METABOLIC PANEL
BUN: 13 mg/dL (ref 6–23)
CALCIUM: 9.5 mg/dL (ref 8.4–10.5)
CO2: 29 mEq/L (ref 19–32)
CREATININE: 0.63 mg/dL (ref 0.50–1.10)
Chloride: 102 mEq/L (ref 96–112)
Glucose, Bld: 87 mg/dL (ref 70–99)
Potassium: 4.7 mEq/L (ref 3.5–5.3)
Sodium: 138 mEq/L (ref 135–145)

## 2013-09-01 LAB — T4, FREE: FREE T4: 0.94 ng/dL (ref 0.80–1.80)

## 2013-09-01 LAB — TSH: TSH: 0.94 u[IU]/mL (ref 0.350–4.500)

## 2013-09-01 LAB — MAGNESIUM: MAGNESIUM: 2.1 mg/dL (ref 1.5–2.5)

## 2013-09-01 NOTE — Assessment & Plan Note (Signed)
Father with CABG at 65

## 2013-09-01 NOTE — Patient Instructions (Signed)
Exercise Stress Myoview, within 2 weeks  Lab work today  Keep follow up appt with Dr. Claiborne Billings

## 2013-09-01 NOTE — Assessment & Plan Note (Signed)
Continue, stopped stimulants.  With family hx CAD will do exercise myoview

## 2013-09-01 NOTE — Progress Notes (Signed)
09/01/2013   PCP: No PCP Per Patient   Chief Complaint  Patient presents with  . Follow-up    feeling like she cant get a deep breath in    Primary Cardiologist: Dr. Claiborne Billings  HPI:  58 year old WF followed by Dr. Claiborne Billings has a history of palpitations. 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 In the past she also has a history of elevation of liver function studies as well as hyperlipidemia. She apparently has been taking Armour Thyroid. Recently, she has noticed episodes of palpitations. These seem to have been occurring more frequently over the past 2 months. Often times she notes these in the morning after she wakes up. She describes them as skips. She did drink one cup of coffee daily. She denies associated chest pressure or presyncope or syncope.  Her thyroid meds were adjusted. She has stopped caffeine.  Her palpitations have returned and are bothersome.   She is concerned this could be related to CAD.  Her father had premature CAD.  Her last stress test was 2004.  No chest pain but does feel strange in her chest -most likely due to the irregular heart beat.  With holter she was found to have PVCs and PACs.    Allergies  Allergen Reactions  . Ampicillin Rash    Current Outpatient Prescriptions  Medication Sig Dispense Refill  . Cholecalciferol (VITAMIN D) 2000 UNITS CAPS Take by mouth.      Adalberto Ill Estrogens-Bazedoxifene (DUAVEE) 0.45-20 MG TABS Take 1 tablet by mouth 1 day or 1 dose.  30 tablet  12  . magnesium oxide (MAG-OX) 400 MG tablet Take 400 mg by mouth daily.      . ondansetron (ZOFRAN-ODT) 4 MG disintegrating tablet Take 1 tablet (4 mg total) by mouth every 8 (eight) hours as needed for nausea or vomiting.  20 tablet  0  . promethazine (PHENERGAN) 25 MG suppository Place 1 suppository (25 mg total) rectally every 6 (six) hours as needed for nausea or  vomiting.  12 each  0  . rizatriptan (MAXALT) 10 MG tablet Take 1 tablet (10 mg total) by mouth as needed for migraine. May repeat in 2 hours if needed  10 tablet  0  . thyroid (ARMOUR) 60 MG tablet Take 1.5 tablets (90 mg total) by mouth daily.  90 tablet  2  . zolpidem (AMBIEN) 5 MG tablet Take 1 tablet (5 mg total) by mouth at bedtime as needed for sleep.  30 tablet  0  . 5-Methyltetrahydrofolate Calc POWD       . Estradiol Acetate 0.05 MG/24HR RING Place 1 each vaginally every 3 (three) months.  1 each  3  . IODINE, KELP, PO Apply 3 drops topically daily.      . metoprolol tartrate (LOPRESSOR) 25 MG tablet Take 1 tablet (25 mg total) by mouth as needed.  30 tablet  3  . progesterone (PROMETRIUM) 100 MG capsule Take 100 mg by mouth daily.      . rizatriptan (MAXALT) 10 MG tablet Take 10 mg by mouth as needed for migraine. May repeat in 2 hours if needed       No current facility-administered medications for this visit.    Past Medical History  Diagnosis Date  . Migraines 1998  . IBS (irritable bowel syndrome)   . Diverticulosis   .  Colitis 1980  . Palpitations   . Family history of premature CAD 09/01/2013    Past Surgical History  Procedure Laterality Date  . Breast biopsy  1979    benign  . Colposcopy  05/2009    CIN 1  . Cesarean section  1977, 1978  . Oophorectomy  1985    ruptured cyst   . Appendectomy    . Thyroidectomy, partial    . Myocardial perfusion study  04/07/2003    Normal Cardiolite   . 2d echocardiogram  05/09/2011    EF >55%, normal-mild    NLG:XQJJHER:DE colds or fevers, no weight changes Skin:no rashes or ulcers HEENT:no blurred vision, no congestion CV:see HPI PUL:see HPI GI:no diarrhea constipation or melena, no indigestion GU:no hematuria, no dysuria MS:no joint pain, no claudication Neuro:no syncope, no lightheadedness Endo:no diabetes, no thyroid disease  PHYSICAL EXAM BP 100/62  Ht 4\' 11"  (1.499 m)  Wt 121 lb (54.885 kg)  BMI 24.43  kg/m2  LMP 08/25/2010 General:Pleasant affect, NAD Skin:Warm and dry, brisk capillary refill HEENT:normocephalic, sclera clear, mucus membranes moist Neck:supple, no JVD, no bruits  Heart:S1S2 RRR without murmur, gallup, rub or click, occ premature beat Lungs:clear without rales, rhonchi, or wheezes YCX:KGYJ, non tender, + BS, do not palpate liver spleen or masses Ext:no lower ext edema, 2+ pedal pulses, 2+ radial pulses Neuro:alert and oriented, MAE, follows commands, + facial symmetry  EKG:SR non specific ST changes  ASSESSMENT AND PLAN Palpitations Continue, stopped stimulants.  With family hx CAD will do exercise myoview  Family history of premature CAD Father with CABG at 45  Hyperlipidemia monitored  Will check her thyroid before visit with Dr. Claiborne Billings.  Keep next appt.

## 2013-09-01 NOTE — Assessment & Plan Note (Signed)
monitored

## 2013-09-12 ENCOUNTER — Telehealth (HOSPITAL_COMMUNITY): Payer: Self-pay

## 2013-09-16 ENCOUNTER — Ambulatory Visit (HOSPITAL_COMMUNITY)
Admission: RE | Admit: 2013-09-16 | Discharge: 2013-09-16 | Disposition: A | Discharge status: Home / Self Care | Payer: BC Managed Care – PPO | Source: Ambulatory Visit | Attending: Internal Medicine | Admitting: Internal Medicine

## 2013-09-16 DIAGNOSIS — R0989 Other specified symptoms and signs involving the circulatory and respiratory systems: Secondary | ICD-10-CM

## 2013-09-16 DIAGNOSIS — I498 Other specified cardiac arrhythmias: Secondary | ICD-10-CM | POA: Insufficient documentation

## 2013-09-16 DIAGNOSIS — I4949 Other premature depolarization: Secondary | ICD-10-CM

## 2013-09-16 DIAGNOSIS — Z8249 Family history of ischemic heart disease and other diseases of the circulatory system: Secondary | ICD-10-CM | POA: Insufficient documentation

## 2013-09-16 HISTORY — PX: CARDIOVASCULAR STRESS TEST: SHX262

## 2013-09-16 MED ORDER — TECHNETIUM TC 99M SESTAMIBI GENERIC - CARDIOLITE
30.1000 | Freq: Once | INTRAVENOUS | Status: AC | PRN
  Administered 2013-09-16: 30.1 via INTRAVENOUS

## 2013-09-16 MED ORDER — TECHNETIUM TC 99M SESTAMIBI GENERIC - CARDIOLITE
10.5000 | Freq: Once | INTRAVENOUS | Status: AC | PRN
  Administered 2013-09-16: 11 via INTRAVENOUS

## 2013-09-16 NOTE — Procedures (Addendum)
[facility] [address] [place] [address] [phone]  Cardiology Nuclear Med Study  Kimberly Miles is a 58 y.o. female     MRN : [id number]     DOB: 08/04/1955  Procedure Date: 09/16/2013  Nuclear Med Background Indication for Stress Test:  Evaluation for Ischemia History:  No prior cardiac or respiratory history reported. No prior NUC study for comparrison. Cardiac Risk Factors: Family History - CAD and Lipids  Symptoms:  Fatigue, Light-Headedness and Palpitations   Nuclear Pre-Procedure Caffeine/Decaff Intake:  1:00am NPO After: 11am   IV Site: R Forearm  IV 0.9% NS with Angio Cath:  22g  Chest Size (in):  n/a IV Started by: Azucena Cecil, RN  Height: 4\' 11"  (1.499 m)  Cup Size: DD  BMI:  Body mass index is 24.43 kg/(m^2). Weight:  121 lb (54.885 kg)   Tech Comments:  n/a    Nuclear Med Study 1 or 2 day study: 1 day  Stress Test Type:  Stress  Order Authorizing Provider:  Shelva Majestic, MD   Resting Radionuclide: Technetium 26m Sestamibi  Resting Radionuclide Dose: 10.5 mCi   Stress Radionuclide:  Technetium 65m Sestamibi  Stress Radionuclide Dose: 30.1 mCi           Stress Protocol Rest HR: 65 Stress HR: 162  Rest BP: 123/80 Stress BP: 184/90  Exercise Time (min): 8 METS: 10.1   Predicted Max HR: 160 bpm % Max HR: 101.25 bpm Rate Pressure Product: 29808  Dose of Adenosine (mg):  n/a Dose of Lexiscan: n/a mg  Dose of Atropine (mg): n/a Dose of Dobutamine: n/a mcg/kg/min (at max HR)  Stress Test Technologist: Leane Para, CCT Nuclear Technologist: Imagene Riches, CNMT   Rest Procedure:  Myocardial perfusion imaging was performed at rest 45 minutes following the intravenous administration of Technetium 47m Sestamibi. Stress Procedure:  The patient performed treadmill exercise using a Bruce  Protocol for 8 minutes. The patient stopped due to SOB and denied any chest pain.  There were no significant  ST-T wave changes.  Technetium 30m Sestamibi was injected at peak exercise and myocardial perfusion imaging was performed after a brief delay.  Transient Ischemic Dilatation (Normal <1.22):  0.90 Lung/Heart Ratio (Normal <0.45):  0.30 QGS EDV:  34 ml QGS ESV:  5 ml LV Ejection Fraction: 84%       Rest ECG: NSR - Normal EKG  Stress ECG: No significant change from baseline ECG  QPS Raw Data Images:  Normal; no motion artifact; normal heart/lung ratio. Stress Images:  Normal homogeneous uptake in all areas of the myocardium. Rest Images:  Normal homogeneous uptake in all areas of the myocardium. Subtraction (SDS):  No evidence of ischemia.  Impression Exercise Capacity:  Good exercise capacity. BP Response:  Normal blood pressure response. Clinical Symptoms:  No significant symptoms noted. ECG Impression:  No significant ST segment change suggestive of ischemia. Comparison with Prior Nuclear Study: No images to compare  Overall Impression:  Normal stress nuclear study.  LV Wall Motion:  NL LV Function; NL Wall Motion   Lorretta Harp, MD  09/17/2013 1:44 PM

## 2013-10-09 ENCOUNTER — Ambulatory Visit: Payer: BC Managed Care – PPO | Admitting: Cardiovascular Disease

## 2013-10-15 ENCOUNTER — Telehealth: Payer: Self-pay | Admitting: Gynecology

## 2013-10-15 DIAGNOSIS — N951 Menopausal and female climacteric states: Secondary | ICD-10-CM

## 2013-10-15 MED ORDER — CONJ ESTROGENS-BAZEDOXIFENE 0.45-20 MG PO TABS: 1.0000 | ORAL_TABLET | ORAL | Status: DC

## 2013-10-15 NOTE — Telephone Encounter (Signed)
Medication changed to 90 day supply.  Patient aware, very thankful.  Routing to provider for final review. Patient agreeable to disposition. Will close encounter

## 2013-10-15 NOTE — Telephone Encounter (Addendum)
Patient is calling saying her pharmacy said her rx for Conj Estrogens-Bazedoxifene (DUAVEE) 0.45-20 MG TABS  Needs to be in 90 days supply. She only has two pills left. Needs it to be done by tomorrow to get reimbursement on the higher price.

## 2013-11-11 ENCOUNTER — Other Ambulatory Visit: Payer: Self-pay | Admitting: Gynecology

## 2013-11-11 NOTE — Telephone Encounter (Signed)
eScribe request from [facility] for refill on MAXALT Last filled - 07/11/13, #10 X 0 Last AEX - 07/11/13 Next AEX - 07/13/14  Please advise refills.

## 2013-11-12 NOTE — Telephone Encounter (Signed)
Pt is requesting a refill for Maxalt. Pt wants to talk with the nurse first she states she has several issues such as prior authirization. Pt is leaving for [place] on Sunday and wants to get this resolved quickly.

## 2013-11-12 NOTE — Telephone Encounter (Signed)
Please advise if this is ok to refill-was given #[id number]/0RF on 07/11/13//kn

## 2013-11-27 ENCOUNTER — Ambulatory Visit: Payer: BC Managed Care – PPO | Admitting: Cardiovascular Disease

## 2014-01-07 NOTE — Telephone Encounter (Signed)
Encounter complete. 

## 2014-01-12 ENCOUNTER — Encounter: Payer: Self-pay | Admitting: Cardiovascular Disease

## 2014-01-12 ENCOUNTER — Ambulatory Visit (INDEPENDENT_AMBULATORY_CARE_PROVIDER_SITE_OTHER): Payer: BC Managed Care – PPO | Admitting: Cardiovascular Disease

## 2014-01-12 VITALS — BP 108/60 | HR 69 | Ht 59.0 in | Wt 115.7 lb

## 2014-01-12 DIAGNOSIS — Z9889 Other specified postprocedural states: Secondary | ICD-10-CM

## 2014-01-12 DIAGNOSIS — E890 Postprocedural hypothyroidism: Secondary | ICD-10-CM

## 2014-01-12 DIAGNOSIS — E785 Hyperlipidemia, unspecified: Secondary | ICD-10-CM

## 2014-01-12 DIAGNOSIS — Z8669 Personal history of other diseases of the nervous system and sense organs: Secondary | ICD-10-CM

## 2014-01-12 DIAGNOSIS — R002 Palpitations: Secondary | ICD-10-CM

## 2014-01-12 DIAGNOSIS — Z9009 Acquired absence of other part of head and neck: Secondary | ICD-10-CM

## 2014-01-12 NOTE — Patient Instructions (Signed)
Continue current medications.  Your physician recommends that you schedule a follow-up appointment in: One year with Dr. Claiborne Billings.

## 2014-01-12 NOTE — Progress Notes (Signed)
Patient ID: Kimberly Miles, female   DOB: 10-10-1955, 58 y.o.   MRN: [id number]     HPI: Kimberly Miles, is a 58 y.o. female who presents to the office today for 10 month followup cardiology evaluation.  Kimberly Miles has a history of palpitations. 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 In the past she also has a history of elevation of liver function studies as well as hyperlipidemia. When I saw her last year, she was having increased palpitations. I recommended that she discontinue her caffeine intact. Laboratory revealed normal renal function. Her ALT was minimally increased at 41 and 11 liver function studies were normal. Hemoglobin was 14.6 hematocrit 42.4. TSH was over suppressed at 0.169. Free T3 was 3.0 and free T4 0.83.  I recommended that she reduce her Armour Thyroid from 90 mg to 60 mg.  She wore a 48 hour Holter monitor which showed a minimum heart rate of 46, and average heart rate at 70 beats per minute and maximal heart rate of 129 beats per minute the she was noted to have occasional to frequent PVCs predominantly single but she did have episodes of bigeminy trigeminy and quadrigeminy. These seem most prominent she was sleeping the she denies any awareness of sleep apnea. She denies snoring.   Over the past year, she states another physician had increased her Armour Thyroid back to 90 mg.  She did not feel as well on this and actually has been taking a half of this presently, but now notes more fatigue.  She denies any recurrent prolonged palpitations.  There is no shortness of breath or chest pain.  She felt that she did not tolerate one dose of metoprolol that she had taken in the past and has not taken any metoprolol therapy for palpitations.  She underwent a nuclear perfusion study in March 2015 after she had seen Smiley Houseman in the office with vague chest sensation.  Her  nuclear study was normal and showed hyperdynamic LV function with post stress ejection fraction 84%.  There was normal perfusion.  Past Medical History  Diagnosis Date  . Migraines 1998  . IBS (irritable bowel syndrome)   . Diverticulosis   . Colitis 1980  . Palpitations   . Family history of premature CAD 09/01/2013    Past Surgical History  Procedure Laterality Date  . Breast biopsy  1979    benign  . Colposcopy  05/2009    CIN 1  . Cesarean section  1977, 1978  . Oophorectomy  1985    ruptured cyst   . Appendectomy    . Thyroidectomy, partial    . Myocardial perfusion study  04/07/2003    Normal Cardiolite   . 2d echocardiogram  05/09/2011    EF >55%, normal-mild    Allergies  Allergen Reactions  . Codeine   . Erythromycin   . Sulfa Antibiotics   . Ampicillin Rash    Current Outpatient Prescriptions  Medication Sig Dispense Refill  . Cholecalciferol (VITAMIN D) 2000 UNITS CAPS Take by mouth.      . magnesium oxide (MAG-OX) 400 MG tablet Take 400 mg by mouth daily.      Marland Kitchen OVER THE COUNTER MEDICATION Take 1 capsule by mouth every other day. Optimal Start      . rizatriptan (MAXALT) 10 MG tablet TAKE 1 TABLET (10 MG TOTAL) BY MOUTH AS NEEDED FOR MIGRAINE. MAY  REPEAT IN 2 HOURS IF NEEDED  10 tablet  0  . thyroid (ARMOUR) 60 MG tablet Take 1.5 tablets (90 mg total) by mouth daily.  90 tablet  2  . zolpidem (AMBIEN) 5 MG tablet Take 1 tablet (5 mg total) by mouth at bedtime as needed for sleep.  30 tablet  0   No current facility-administered medications for this visit.    History   Social History  . Marital Status: Married    Spouse Name: N/A    Number of Children: N/A  . Years of Education: N/A   Occupational History  . Not on file.   Social History Main Topics  . Smoking status: Never Smoker   . Smokeless tobacco: Not on file  . Alcohol Use: 0.5 oz/week    1 drink(s) per week     Comment: occ alcohol wine  . Drug Use: No  . Sexual Activity: Yes     Partners: Male    Birth Control/ Protection: Post-menopausal   Other Topics Concern  . Not on file   Social History Narrative  . No narrative on file    Family history is notable that both parents are living. Mother has a history of lung cancer as well as hypertension hyperlipidemia and kidney issues. Father has heart disease.  ROS General: Negative; No fevers, chills, or night sweats;  HEENT: Negative; No changes in vision or hearing, sinus congestion, difficulty swallowing Pulmonary: Negative; No cough, wheezing, shortness of breath, hemoptysis Cardiovascular: Negative; No chest pain, presyncope, syncope, palpitations GI: Negative; No nausea, vomiting, diarrhea, or abdominal pain GU: Negative; No dysuria, hematuria, or difficulty voiding Musculoskeletal: Negative; no myalgias, joint pain, or weakness Hematologic/Oncology: Positive for history of thyroidectomy secondary to follicular adenoma no easy bruising, bleeding Endocrine: Negative; no heat/cold intolerance; no diabetes Neuro: Positive for history of migraine headaches. no changes in balance, headaches Skin: Negative; No rashes or skin lesions Psychiatric: Negative; No behavioral problems, depression Sleep: Negative; No snoring, daytime sleepiness, hypersomnolence, bruxism, restless legs, hypnogognic hallucinations, no cataplexy Other comprehensive 14 point system review is negative.   PE BP 108/60  Pulse 69  Ht 4\' 11"  (1.499 m)  Wt 115 lb 11.2 oz (52.481 kg)  BMI 23.36 kg/m2  LMP 08/25/2010  General: Alert, oriented, no distress.  Skin: normal turgor, no rashes HEENT: Normocephalic, atraumatic. Pupils round and reactive; sclera anicteric;no lid lag.  Nose without nasal septal hypertrophy Mouth/Parynx benign; Mallinpatti scale 2 Neck: No JVD, no carotid bruits with normal carotid upstroke Lungs: clear to ausculatation and percussion; no wheezing or rales Heart: RRR, s1 s2 normal; faint 1/6 systolic murmur; no  diastolic murmur Abdomen: soft, nontender; no hepatosplenomehaly, BS+; abdominal aorta nontender and not dilated by palpation. Back: No CVA tenderness Pulses 2+ Extremities: no clubbing cyanosis or edema, Homan's sign negative  Neurologic: grossly nonfocal; no tremors Psychologic: Normal affect and mood.  ECG (independently read by me): Normal sinus rhythm at 69 beats per minute.  No ectopy.  September 2014 ECG: Normal sinus rhythm at 76 beats per minute. PR interval 148 Kimberly. QTc interval 441 milliseconds  LABS:  BMET    Component Value Date/Time   NA 138 09/01/2013 1100   K 4.7 09/01/2013 1100   CL 102 09/01/2013 1100   CO2 29 09/01/2013 1100   GLUCOSE 87 09/01/2013 1100   BUN 13 09/01/2013 1100   CREATININE 0.63 09/01/2013 1100   CREATININE 0.57 03/02/2011 1102   CALCIUM 9.5 09/01/2013 1100   GFRNONAA >60 03/02/2011 1102  GFRAA >60 03/02/2011 1102     Hepatic Function Panel     Component Value Date/Time   PROT 6.3 02/25/2013 0938     CBC    Component Value Date/Time   WBC 5.8 09/01/2013 1100   RBC 4.73 09/01/2013 1100   HGB 15.0 09/01/2013 1100   HCT 43.7 09/01/2013 1100   PLT 222 09/01/2013 1100   MCV 92.4 09/01/2013 1100   MCH 31.7 09/01/2013 1100   MCHC 34.3 09/01/2013 1100   RDW 13.1 09/01/2013 1100     BNP No results found for this basename: probnp    Lipid Panel     Component Value Date/Time   CHOL 219* 02/25/2013 0938     RADIOLOGY: No results found.    ASSESSMENT AND PLAN: Kimberly Miles is a very pleasant 58 year old female who is status post partial thyroidectomy due to the follicular adenoma and has been on thyroid replacement therapy.  An echo Doppler study in November 2012 revealed normal systolic and diastolic function at that time and had normal chamber dimensions. A Holter monitor study last year revealed  PVCs. These occurred in isolated, as well is in a bigeminal, trigeminal, and rarely a quadrigeminal pattern.  At that time, her TSH was over suppressed and I recommended a  reduction of her thyroid medication.  She had felt well on this and subsequent blood work in March 2015 showed a TSH that had improved and was now 0.9.  4.  Her free T4 was normal at 0.9.  4.  Apparently, since that time another physician had recommended that she increase her thyroid back to 90 mg, but apparently she now has only been taking 45 mg, which may explain some of her fatigability.  She is not having any ectopy.  I reviewed her nuclear study with her in detail, which was normal.  I will see her in one year for followup evaluation or sooner if problems arise.   Troy Sine, MD, [facility]  01/12/2014 2:41 PM

## 2014-01-14 ENCOUNTER — Encounter: Payer: Self-pay | Admitting: Cardiovascular Disease

## 2014-01-14 DIAGNOSIS — Z9009 Acquired absence of other part of head and neck: Secondary | ICD-10-CM | POA: Insufficient documentation

## 2014-01-14 DIAGNOSIS — E890 Postprocedural hypothyroidism: Secondary | ICD-10-CM | POA: Insufficient documentation

## 2014-01-19 ENCOUNTER — Other Ambulatory Visit: Payer: Self-pay | Admitting: Obstetrics and Gynecology

## 2014-01-19 ENCOUNTER — Telehealth: Payer: Self-pay | Admitting: Gynecology

## 2014-01-19 MED ORDER — RIZATRIPTAN BENZOATE 10 MG PO TABS: 10.0000 mg | ORAL_TABLET | ORAL | Status: DC | PRN

## 2014-01-19 NOTE — Telephone Encounter (Signed)
Spoke with patient. Patient states that she spoke with her insurance about her Maxalt rx. Her insurance will cover 54 tablets for 90 days. Patient [facility]. Patient was last seen for annual exam with Dr.Lathrop on 07/11/2013 and was given Maxalt 10mg  #10 with 0RF. Patient has been having migraines weekly that last 2-3 days. Patient took last Maxalt today. Advised would send a message over to covering provider regarding request and give patient a call back with further information. Patient agreeable.  Dr.Silva, okay to refill patient's rx at this time?

## 2014-01-19 NOTE — Telephone Encounter (Signed)
Pt wants to talk with the nurse no information given. °

## 2014-01-19 NOTE — Telephone Encounter (Signed)
Ok to refill Maxalt. #10, RF none.

## 2014-01-20 NOTE — Telephone Encounter (Signed)
Spoke with patient. Advised of refill sent to pharmacy of choice. Patient agreeable and verbalizes understanding.  Routing to Dr.Lathrop Cc: Dr.Silva  Routing to provider for final review. Patient agreeable to disposition. Will close encounter

## 2014-01-21 ENCOUNTER — Telehealth: Payer: Self-pay | Admitting: Gynecology

## 2014-01-21 NOTE — Telephone Encounter (Signed)
Dr. Charlies Constable,  Patient calling again requesting refill for 54 tablets of Maxalt to last over 90 days. States her insurance authorizes 18 per month.  Patient states she is retired and the cost savings for 90 days of treatment is substantial and she really would like the 90 day supply. Patient states she has been to see neurology before and "I've been through all of the medications and Maxalt is what really helps". Patient states she has been having 2-3 headaches per week and using 2 tablets per headache. Advised would send her request to Dr. Charlies Constable.

## 2014-01-21 NOTE — Telephone Encounter (Signed)
That seems like a lot of headaches and medication she is going thru, I think she should have it prescribed by neurology, if she needs a referral we can give her one, can call in 24 until she can be seen, no refill

## 2014-01-22 NOTE — Telephone Encounter (Signed)
Calling patient with message from Dr lathrop. LMTCB.

## 2014-01-22 NOTE — Telephone Encounter (Signed)
Patient returning call, advised of Dr Lathrop's response. As GYN office, management of migraines is limited and the requested amount of 54 tablets exceeds what MD feels we can prescribe. If having this may migraines, would need to have evaluation with PCP or neurologist.   Patient asking how many we are able to prescribe. Advised Dr Charlies Constable is authorizing 24 tablets.  Patient states this is not any better than the 10 tablets already called in. I advised her that we usually only prescribe 9 per month. Patient states that this would be 27 tablets. Advised Dr lathrop will prescribe 24 tablets, again reminded we are a GYN practice and treat migraines on a limited basis. Offered to assist with scheduling appointment with PCP or neuro. Patient declines appointment assistance, states she will schedule with neuro, declines the RX for 24 tablets. Karen Chafe RN, triage present for call.  Routing to provider for final review. Patient agreeable to disposition. Will close encounter

## 2014-01-23 NOTE — Telephone Encounter (Signed)
This RN present for phone call as stated below by Lamont Snowball, RN.  Agree with note per Gay Filler.

## 2014-04-10 ENCOUNTER — Other Ambulatory Visit: Payer: Self-pay

## 2014-04-27 ENCOUNTER — Encounter: Payer: Self-pay | Admitting: Cardiovascular Disease

## 2014-05-04 ENCOUNTER — Encounter: Payer: Self-pay | Admitting: Family Medicine

## 2014-05-04 ENCOUNTER — Ambulatory Visit (INDEPENDENT_AMBULATORY_CARE_PROVIDER_SITE_OTHER): Payer: BC Managed Care – PPO | Admitting: Family Medicine

## 2014-05-04 VITALS — BP 112/75 | HR 98 | Temp 98.2°F | Ht 58.5 in | Wt 111.8 lb

## 2014-05-04 DIAGNOSIS — M2550 Pain in unspecified joint: Secondary | ICD-10-CM

## 2014-05-04 DIAGNOSIS — R748 Abnormal levels of other serum enzymes: Secondary | ICD-10-CM

## 2014-05-04 DIAGNOSIS — E038 Other specified hypothyroidism: Secondary | ICD-10-CM

## 2014-05-04 DIAGNOSIS — Z23 Encounter for immunization: Secondary | ICD-10-CM

## 2014-05-04 DIAGNOSIS — E538 Deficiency of other specified B group vitamins: Secondary | ICD-10-CM

## 2014-05-04 LAB — THYROID PANEL WITH TSH
Free Thyroxine Index: 1.7 (ref 1.4–3.8)
T3 Uptake: 26 % (ref 22–35)
T4 TOTAL: 6.4 ug/dL (ref 4.5–12.0)
TSH: 0.497 u[IU]/mL (ref 0.350–4.500)

## 2014-05-04 LAB — RHEUMATOID FACTOR

## 2014-05-04 NOTE — Patient Instructions (Signed)

## 2014-05-04 NOTE — Progress Notes (Signed)
Pre visit review using our clinic review tool, if applicable. No additional management support is needed unless otherwise documented below in the visit note. 

## 2014-05-04 NOTE — Progress Notes (Signed)
 Subjective:    Patient ID: Kimberly Miles, female    DOB: 02/22/1956, 58 y.o.   MRN: [id number]  HPI Pt is here to establish and has multiple complaints.  She has a long history of muscle aches and joint pains.  She is on armour thyroid and has hx mthfr gene mutation .  She also has hx b12 deficiency.  Pt has hx chronic sore throat and ENT told her she had gerd.   Pt states acid reducer never helped.  She also sees dr Freeman for Migraines.  Past Medical History  Diagnosis Date  . Migraines 1998  . IBS (irritable bowel syndrome)   . Diverticulosis   . Colitis 1980  . Palpitations   . Family history of premature CAD 09/01/2013   History   Social History  . Marital Status: Married    Spouse Name: N/A    Number of Children: N/A  . Years of Education: N/A   Occupational History  . Not on file.   Social History Main Topics  . Smoking status: Never Smoker   . Smokeless tobacco: Not on file  . Alcohol Use: 0.5 oz/week    1 drink(s) per week     Comment: occ alcohol wine  . Drug Use: No  . Sexual Activity:    Partners: Male    Birth Control/ Protection: Post-menopausal   Other Topics Concern  . Not on file   Social History Narrative   Family History  Problem Relation Age of Onset  . Cancer Mother 71    Lung cancer  . Hypertension Mother 60  . Hyperlipidemia Mother 60  . Hypertension Maternal Grandmother   . Cancer Maternal Grandmother     Uterine cancer  . Vascular Disease Maternal Grandmother   . Stroke Maternal Grandfather   . Cancer Paternal Grandfather     Prostate cancer  . Ehlers-Danlos syndrome Mother    Current Outpatient Prescriptions  Medication Sig Dispense Refill  . ARMOUR THYROID 90 MG tablet Take 1 tablet by mouth daily. Patient is breaking the pill 3/4 and taking 3/4 of the pill.  0  . baclofen (LIORESAL) 10 MG tablet Take 1 tablet by mouth 2 (two) times daily as needed.  0  . Cholecalciferol (VITAMIN D3) 5000 UNITS CAPS Take 1 capsule by mouth  daily.    . MAGNESIUM GLYCINATE PLUS PO Take 450 mg by mouth daily.    . OVER THE COUNTER MEDICATION Take 1 capsule by mouth every other day. Optimal Start    . zolpidem (AMBIEN) 5 MG tablet Take 1 tablet (5 mg total) by mouth at bedtime as needed for sleep. 30 tablet 0   No current facility-administered medications for this visit.     Review of Systems As above    Objective:   Physical Exam  BP 112/75 mmHg  Pulse 98  Temp(Src) 98.2 F (36.8 C) (Oral)  Ht 4' 10.5" (1.486 m)  Wt 111 lb 12.8 oz (50.712 kg)  BMI 22.97 kg/m2  SpO2 95%  LMP 08/25/2010 General appearance: alert, cooperative, appears stated age and no distress Throat: lips, mucosa, and tongue normal; teeth and gums normal Neck: no adenopathy, no carotid bruit, no JVD, supple, symmetrical, trachea midline and thyroid not enlarged, symmetric, no tenderness/mass/nodules Lungs: clear to auscultation bilaterally Heart: regular rate and rhythm, S1, S2 normal, no murmur, click, rub or gallop Extremities: extremities normal, atraumatic, no cyanosis or edema Neurologic: Alert and oriented X 3, normal strength and tone. Normal symmetric reflexes. Normal   coordination and gait      Assessment & Plan:  1. B12 deficiency Pt with hx of def-- check level and replace if needed - Vitamin B12  2. Other specified hypothyroidism con't med and check labs - Thyroid Panel With TSH  3. Elevated liver enzymes Pt is not taking any meds that can inc liver function May need gi referral - Hepatic function panel - Gamma GT  4. Multiple joint pain Check labs , consider rheum - Vitamin D (25 hydroxy) - ANA - Rheumatoid factor - Sed Rate (ESR) - B. burgdorfi antibodies  

## 2014-05-05 ENCOUNTER — Encounter: Payer: Self-pay | Admitting: Internal Medicine

## 2014-05-05 ENCOUNTER — Other Ambulatory Visit: Payer: Self-pay | Admitting: Family Medicine

## 2014-05-05 DIAGNOSIS — R748 Abnormal levels of other serum enzymes: Secondary | ICD-10-CM

## 2014-05-05 DIAGNOSIS — K219 Gastro-esophageal reflux disease without esophagitis: Secondary | ICD-10-CM

## 2014-05-05 LAB — HEPATIC FUNCTION PANEL
ALBUMIN: 3.6 g/dL (ref 3.5–5.2)
ALT: 61 U/L — ABNORMAL HIGH (ref 0–35)
AST: 45 U/L — ABNORMAL HIGH (ref 0–37)
Alkaline Phosphatase: 83 U/L (ref 39–117)
Bilirubin, Direct: 0 mg/dL (ref 0.0–0.3)
Total Bilirubin: 0.5 mg/dL (ref 0.2–1.2)
Total Protein: 6.3 g/dL (ref 6.0–8.3)

## 2014-05-05 LAB — B. BURGDORFI ANTIBODIES: B burgdorferi Ab IgG+IgM: 0.25 {ISR}

## 2014-05-05 LAB — HEPATITIS C ANTIBODY: HCV Ab: NEGATIVE

## 2014-05-05 LAB — GAMMA GT: GGT: 50 U/L (ref 7–51)

## 2014-05-05 LAB — SEDIMENTATION RATE: Sed Rate: 7 mm/hr (ref 0–22)

## 2014-05-05 LAB — VITAMIN B12: VITAMIN B 12: 728 pg/mL (ref 211–911)

## 2014-05-05 LAB — VITAMIN D 25 HYDROXY (VIT D DEFICIENCY, FRACTURES): VITD: 69.36 ng/mL (ref 30.00–100.00)

## 2014-05-05 LAB — ANA: Anti Nuclear Antibody(ANA): NEGATIVE

## 2014-05-08 ENCOUNTER — Telehealth: Payer: Self-pay | Admitting: Family Medicine

## 2014-05-08 ENCOUNTER — Other Ambulatory Visit: Payer: Self-pay | Admitting: Family Medicine

## 2014-05-08 DIAGNOSIS — G894 Chronic pain syndrome: Secondary | ICD-10-CM

## 2014-05-08 NOTE — Telephone Encounter (Signed)
Please advise      KP 

## 2014-05-08 NOTE — Telephone Encounter (Signed)
Caller name: Devyn Relation to pt: self Call back number: [phone] Pharmacy:  Reason for call:   Patient would like to be referred to Dr. Greta Doom with [facility] regarding neck/back pain.

## 2014-05-18 ENCOUNTER — Telehealth: Payer: Self-pay | Admitting: Family Medicine

## 2014-05-18 DIAGNOSIS — E079 Disorder of thyroid, unspecified: Secondary | ICD-10-CM

## 2014-05-18 MED ORDER — LEVOTHYROXINE SODIUM 112 MCG PO TABS: 112.0000 ug | ORAL_TABLET | Freq: Every day | ORAL | Status: DC

## 2014-05-18 NOTE — Telephone Encounter (Signed)
Please advise      KP 

## 2014-05-18 NOTE — Telephone Encounter (Signed)
Caller name: Sadaf Relation to pt: self Call back number: [phone] [facility] [address]  Reason for call:   Patient wants to know if she can start taking generic for synthroid instead of armour? Patient states that the cost of armour is too expensive

## 2014-05-18 NOTE — Telephone Encounter (Signed)
Patient has been made ware and voiced understanding. Rx faxed and lab apt scheduled.    KP

## 2014-05-18 NOTE — Telephone Encounter (Signed)
Synthroid 112 mcg #30  1 po qd, 2 refills Recheck 2 months---TSH, free t3, free t4  In 2 months

## 2014-05-27 ENCOUNTER — Ambulatory Visit: Payer: BC Managed Care – PPO | Attending: Family Medicine | Admitting: Physical Therapy

## 2014-05-27 DIAGNOSIS — M545 Low back pain: Secondary | ICD-10-CM | POA: Insufficient documentation

## 2014-05-27 DIAGNOSIS — M542 Cervicalgia: Secondary | ICD-10-CM | POA: Insufficient documentation

## 2014-05-28 ENCOUNTER — Other Ambulatory Visit: Payer: Self-pay | Admitting: Physical Medicine and Rehabilitation

## 2014-05-28 DIAGNOSIS — M542 Cervicalgia: Secondary | ICD-10-CM

## 2014-05-29 ENCOUNTER — Ambulatory Visit: Payer: BC Managed Care – PPO | Admitting: Physical Therapy

## 2014-05-29 DIAGNOSIS — M542 Cervicalgia: Secondary | ICD-10-CM | POA: Diagnosis not present

## 2014-06-02 ENCOUNTER — Ambulatory Visit: Payer: BC Managed Care – PPO | Admitting: Physical Therapy

## 2014-06-02 DIAGNOSIS — M542 Cervicalgia: Secondary | ICD-10-CM | POA: Diagnosis not present

## 2014-06-03 ENCOUNTER — Ambulatory Visit
Admission: RE | Admit: 2014-06-03 | Discharge: 2014-06-03 | Disposition: A | Discharge status: Home / Self Care | Payer: BC Managed Care – PPO | Source: Ambulatory Visit | Attending: Physical Medicine and Rehabilitation | Admitting: Physical Medicine and Rehabilitation

## 2014-06-03 DIAGNOSIS — M542 Cervicalgia: Secondary | ICD-10-CM

## 2014-06-04 ENCOUNTER — Ambulatory Visit: Payer: BC Managed Care – PPO | Admitting: Physical Therapy

## 2014-06-04 DIAGNOSIS — M542 Cervicalgia: Secondary | ICD-10-CM | POA: Diagnosis not present

## 2014-06-08 ENCOUNTER — Ambulatory Visit: Payer: BC Managed Care – PPO | Admitting: Physical Therapy

## 2014-06-08 DIAGNOSIS — M542 Cervicalgia: Secondary | ICD-10-CM | POA: Diagnosis not present

## 2014-06-10 ENCOUNTER — Ambulatory Visit: Payer: BC Managed Care – PPO | Admitting: Physical Therapy

## 2014-06-10 DIAGNOSIS — M542 Cervicalgia: Secondary | ICD-10-CM | POA: Diagnosis not present

## 2014-06-15 ENCOUNTER — Ambulatory Visit: Payer: BC Managed Care – PPO | Admitting: Physical Therapy

## 2014-06-15 DIAGNOSIS — M542 Cervicalgia: Secondary | ICD-10-CM | POA: Diagnosis not present

## 2014-06-17 ENCOUNTER — Ambulatory Visit: Payer: BC Managed Care – PPO | Admitting: Physical Therapy

## 2014-07-03 ENCOUNTER — Ambulatory Visit (INDEPENDENT_AMBULATORY_CARE_PROVIDER_SITE_OTHER): Payer: BLUE CROSS/BLUE SHIELD | Admitting: Internal Medicine

## 2014-07-03 ENCOUNTER — Other Ambulatory Visit (INDEPENDENT_AMBULATORY_CARE_PROVIDER_SITE_OTHER): Payer: BLUE CROSS/BLUE SHIELD

## 2014-07-03 ENCOUNTER — Encounter: Payer: Self-pay | Admitting: Internal Medicine

## 2014-07-03 VITALS — BP 100/58 | HR 76 | Ht 59.0 in | Wt 110.1 lb

## 2014-07-03 DIAGNOSIS — R7989 Other specified abnormal findings of blood chemistry: Secondary | ICD-10-CM | POA: Diagnosis not present

## 2014-07-03 DIAGNOSIS — R945 Abnormal results of liver function studies: Principal | ICD-10-CM

## 2014-07-03 DIAGNOSIS — K219 Gastro-esophageal reflux disease without esophagitis: Secondary | ICD-10-CM | POA: Diagnosis not present

## 2014-07-03 LAB — FERRITIN: Ferritin: 92.5 ng/mL (ref 10.0–291.0)

## 2014-07-03 LAB — PROTIME-INR
INR: 1 ratio (ref 0.8–1.0)
Prothrombin Time: 10.8 s (ref 9.6–13.1)

## 2014-07-03 LAB — HEPATITIS B SURFACE ANTIGEN: Hepatitis B Surface Ag: NEGATIVE

## 2014-07-03 LAB — HEPATITIS B SURFACE ANTIBODY,QUALITATIVE: HEP B S AB: NEGATIVE

## 2014-07-03 LAB — HEPATIC FUNCTION PANEL
ALBUMIN: 4.3 g/dL (ref 3.5–5.2)
ALK PHOS: 83 U/L (ref 39–117)
ALT: 49 U/L — ABNORMAL HIGH (ref 0–35)
AST: 29 U/L (ref 0–37)
BILIRUBIN TOTAL: 0.4 mg/dL (ref 0.2–1.2)
Bilirubin, Direct: 0.1 mg/dL (ref 0.0–0.3)
Total Protein: 6.5 g/dL (ref 6.0–8.3)

## 2014-07-03 LAB — IBC PANEL
IRON: 161 ug/dL — AB (ref 42–145)
Saturation Ratios: 51.9 % — ABNORMAL HIGH (ref 20.0–50.0)
Transferrin: 221.4 mg/dL (ref 212.0–360.0)

## 2014-07-03 LAB — IRON: Iron: 161 ug/dL — ABNORMAL HIGH (ref 42–145)

## 2014-07-03 NOTE — Patient Instructions (Signed)
Your physician has requested that you go to the basement for lab work before leaving today   Please follow up with Dr. Henrene Pastor on 08/05/2014 at 9:00am

## 2014-07-03 NOTE — Progress Notes (Signed)
HISTORY OF PRESENT ILLNESS:  Kimberly Miles is a 59 y.o. female with the below listed medical history who is sent today by her primary care provider regarding elevated liver tests. Patient also has questions regarding possible acid reflux. She was a previous patient of Dr. Verl Blalock until his retirement. She did undergo previous colonoscopy in October 2006. This was normal except for diverticulosis. She was said to have functional constipation. Additionally, she underwent upper endoscopy October 2006. This revealed atrophic fundal mucosa. Small bowel biopsies were normal. Because of complaints of dysphagia she was empirically dilated with 58 French Maloney dilator. Regarding liver tests, review of the record reveals mild elevation of ALT 1 year ago. More recent liver tests November 2015 reveal AST 45, ALT 61. Other liver tests and proteins were normal. Sedimentation rate and thyroid profile normal. Antinuclear antibody was normal. Hepatitis C testing was negative. Patient did undergo an abdominal ultrasound January 2014. The liver was normal as was the bile duct. The patient denies a definite family history of liver disease, though possibly something with her mother (uncertain). She denies transfusions. She does have a tattoo, though from professional parlor. No significant alcohol use. Does appear that she uses a myriad of over-the-counter supplements which she could not specify. However, has stopped these. Occasional Tylenol which is also been discontinued. She continues with chronic constipation. In terms of acid reflux, she had difficulties with "gravelly voice" earlier this year. She sought he knows and throat specialist who concluded acid reflux as the cause. She was placed on PPI which did not help. Actually, she states this caused her stomach to burn. She is now off PPI therapy. GI review of systems is otherwise negative.  REVIEW OF SYSTEMS:  All non-GI ROS negative except for headaches,  arthritis  Past Medical History  Diagnosis Date  . Migraines 1998  . IBS (irritable bowel syndrome)   . Diverticulosis   . Colitis 1980  . Palpitations   . Family history of premature CAD 09/01/2013  . Elevated LFTs   . Vitamin B 12 deficiency   . Arthritis   . Basal cell carcinoma   . Thyroid cyst   . Hypothyroidism     Past Surgical History  Procedure Laterality Date  . Breast biopsy Left 1979    benign  . Colposcopy  05/2009    CIN 1  . Cesarean section  1977, 1978    x 2  . Oophorectomy Right 1985    ruptured cyst   . Appendectomy    . Thyroidectomy, partial    . Myocardial perfusion study  04/07/2003    Normal Cardiolite   . 2d echocardiogram  05/09/2011    EF >55%, normal-mild    Social History Kimberly Miles  reports that she has never smoked. She has never used smokeless tobacco. She reports that she drinks alcohol. She reports that she does not use illicit drugs.  family history includes Ehlers-Danlos syndrome in her mother; Hyperlipidemia (age of onset: 10) in her mother; Hypertension in her maternal grandmother; Hypertension (age of onset: 19) in her mother; Kidney disease in her mother; Lung cancer (age of onset: 76) in her mother; Prostate cancer in her paternal grandfather; Stroke in her maternal grandfather; Uterine cancer in her maternal grandmother; Vascular Disease in her maternal grandmother.  Allergies  Allergen Reactions  . Codeine   . Erythromycin   . Sulfa Antibiotics   . Ampicillin Rash       PHYSICAL EXAMINATION: Vital signs: BP 100/58 mmHg  Pulse 76  Ht _0  (1.499 m)  Wt 110 lb 2 oz (49.952 kg)  BMI 22.23 kg/m2  LMP 08/25/2010  Constitutional: generally well-appearing, no acute distress Psychiatric: alert and oriented x3, cooperative Eyes: extraocular movements intact, anicteric, conjunctiva pink Mouth: oral pharynx moist, no lesions Neck: supple no lymphadenopathy Cardiovascular: heart regular rate and rhythm, no  murmur Lungs: clear to auscultation bilaterally Abdomen: soft, nontender, nondistended, no obvious ascites, no peritoneal signs, normal bowel sounds, no organomegaly Rectal: Omitted Extremities: no lower extremity edema bilaterally Skin: no lesions on visible extremities. No spider angiomata Neuro: No focal deficits. No asterixis.    ASSESSMENT:  #1. Mild elevation of hepatic transaminases. Etiology unclear. No evidence for hepatic synthetic dysfunction or clinical liver disease. #2. Diverticulosis on colonoscopy October 2006. No polyps #3. Query reflux disease. No evidence by history. Negative previous EGD 4. Gen. medical problems.   PLAN:  #1. Laboratory profile assessing for viral and nonviral causes for chronic dilation of hepatic transaminases. Also tissue transglutaminase antibody. Finally, PT/INR to assess additionally for synthetic function. #2. Office follow-up in 1 month. #3. Due for routine repeat screening colonoscopy around October 2016.

## 2014-07-06 LAB — TISSUE TRANSGLUTAMINASE, IGA: Tissue Transglutaminase Ab, IgA: 1 U/mL (ref <4)

## 2014-07-06 LAB — ALPHA-1-ANTITRYPSIN: A-1 Antitrypsin, Ser: 141 mg/dL (ref 83–199)

## 2014-07-06 LAB — CERULOPLASMIN: Ceruloplasmin: 27 mg/dL (ref 18–53)

## 2014-07-06 LAB — ANTI-SMOOTH MUSCLE ANTIBODY, IGG: Smooth Muscle Ab: 4 U (ref <20)

## 2014-07-07 ENCOUNTER — Other Ambulatory Visit (INDEPENDENT_AMBULATORY_CARE_PROVIDER_SITE_OTHER): Payer: BLUE CROSS/BLUE SHIELD

## 2014-07-07 DIAGNOSIS — E079 Disorder of thyroid, unspecified: Secondary | ICD-10-CM

## 2014-07-07 LAB — T3, FREE: T3, Free: 3.7 pg/mL (ref 2.3–4.2)

## 2014-07-07 LAB — MITOCHONDRIAL ANTIBODIES: Mitochondrial M2 Ab, IgG: 0.49 (ref <0.91)

## 2014-07-07 LAB — TSH: TSH: 0.1 u[IU]/mL — ABNORMAL LOW (ref 0.35–4.50)

## 2014-07-07 LAB — T4, FREE: FREE T4: 1.41 ng/dL (ref 0.60–1.60)

## 2014-07-08 ENCOUNTER — Other Ambulatory Visit: Payer: Self-pay

## 2014-07-08 DIAGNOSIS — E0590 Thyrotoxicosis, unspecified without thyrotoxic crisis or storm: Secondary | ICD-10-CM

## 2014-07-08 MED ORDER — LEVOTHYROXINE SODIUM 100 MCG PO TABS: 100.0000 ug | ORAL_TABLET | Freq: Every day | ORAL | Status: DC

## 2014-07-13 ENCOUNTER — Ambulatory Visit: Payer: BC Managed Care – PPO | Admitting: Gynecology

## 2014-07-15 ENCOUNTER — Ambulatory Visit (INDEPENDENT_AMBULATORY_CARE_PROVIDER_SITE_OTHER): Payer: BLUE CROSS/BLUE SHIELD | Admitting: Certified Nurse Midwife

## 2014-07-15 ENCOUNTER — Encounter: Payer: Self-pay | Admitting: Certified Nurse Midwife

## 2014-07-15 VITALS — BP 108/62 | HR 68 | Resp 16 | Ht 58.5 in | Wt 110.0 lb

## 2014-07-15 DIAGNOSIS — Z01419 Encounter for gynecological examination (general) (routine) without abnormal findings: Secondary | ICD-10-CM

## 2014-07-15 NOTE — Patient Instructions (Signed)

## 2014-07-15 NOTE — Progress Notes (Signed)
59 y.o. G2P2 Married  Caucasian Fe here for annual exam. Menopausal no HRT,no vaginal bleeding or vaginal dryness. Sees PCP for aex and medication management of Hypothyroid, labs. Changed diet for Migraine headaches and lost weight too! Patient still notices slight breast fullness and migraine at what would be midcycle if having periods. Denies any nipple discharge or changes or masses noted. Still occasional hot flashes.  No other health issues today.   Patient's last menstrual period was 08/25/2010.          Sexually active: Yes.    The current method of family planning is tubal ligation.    Exercising: Yes.    walking, strength & core exercises Smoker:  no  Health Maintenance: Pap: 07-11-13 neg HPV HR neg MMG: 12-19-12 birads 1: neg Colonoscopy: 2006 due, patient will schedule BMD:   10/08 TDaP:  2015 Labs: none Self breast exam: done occ   reports that she has never smoked. She has never used smokeless tobacco. She reports that she drinks alcohol. She reports that she does not use illicit drugs.  Past Medical History  Diagnosis Date  . Migraines 1998  . IBS (irritable bowel syndrome)   . Diverticulosis   . Colitis 1980  . Palpitations   . Family history of premature CAD 09/01/2013  . Elevated LFTs   . Vitamin B 12 deficiency   . Arthritis   . Basal cell carcinoma   . Thyroid cyst   . Hypothyroidism     Past Surgical History  Procedure Laterality Date  . Breast biopsy Left 1979    benign  . Colposcopy  05/2009    CIN 1  . Cesarean section  1977, 1978    x 2  . Oophorectomy Right 1985    ruptured cyst   . Appendectomy    . Thyroidectomy, partial    . Myocardial perfusion study  04/07/2003    Normal Cardiolite   . 2d echocardiogram  05/09/2011    EF >55%, normal-mild    Current Outpatient Prescriptions  Medication Sig Dispense Refill  . baclofen (LIORESAL) 10 MG tablet Take 1 tablet by mouth 2 (two) times daily as needed.  0  . Cholecalciferol (VITAMIN D3) 5000  UNITS CAPS Take 1 capsule by mouth daily.    . cyclobenzaprine (FLEXERIL) 5 MG tablet Take 1 tablet by mouth daily.    Marland Kitchen levothyroxine (SYNTHROID, LEVOTHROID) 100 MCG tablet Take 1 tablet (100 mcg total) by mouth daily. 30 tablet 1  . MAGNESIUM GLYCINATE PLUS PO Take 450 mg by mouth daily.    Marland Kitchen zolpidem (AMBIEN) 5 MG tablet Take 1 tablet (5 mg total) by mouth at bedtime as needed for sleep. 30 tablet 0   No current facility-administered medications for this visit.    Family History  Problem Relation Age of Onset  . Lung cancer Mother 79  . Hypertension Mother 37  . Hyperlipidemia Mother 23  . Hypertension Maternal Grandmother   . Uterine cancer Maternal Grandmother   . Vascular Disease Maternal Grandmother   . Stroke Maternal Grandfather   . Prostate cancer Paternal Grandfather   . Ehlers-Danlos syndrome Mother   . Kidney disease Mother     ROS:  Pertinent items are noted in HPI.  Otherwise, a comprehensive ROS was negative.  Exam:   BP 108/62 mmHg  Pulse 68  Resp 16  Ht 4' 10.5" (1.486 m)  Wt 110 lb (49.896 kg)  BMI 22.60 kg/m2  LMP 08/25/2010 Height: 4' 10.5" (148.6 cm) Ht  Readings from Last 3 Encounters:  07/15/14 4' 10.5" (1.486 m)  07/03/14 4\' 11"  (1.499 m)  05/04/14 4' 10.5" (1.486 m)    General appearance: alert, cooperative and appears stated age Head: Normocephalic, without obvious abnormality, atraumatic Neck: no adenopathy, supple, symmetrical, trachea midline and thyroid normal to inspection and palpation Lungs: clear to auscultation bilaterally Breasts: normal appearance, no masses or tenderness, No nipple retraction or dimpling, No nipple discharge or bleeding, No axillary or supraclavicular adenopathy Heart: regular rate and rhythm Abdomen: soft, non-tender; no masses,  no organomegaly Extremities: extremities normal, atraumatic, no cyanosis or edema Skin: Skin color, texture, turgor normal. No rashes or lesions Lymph nodes: Cervical, supraclavicular,  and axillary nodes normal. No abnormal inguinal nodes palpated Neurologic: Grossly normal   Pelvic: External genitalia:  no lesions              Urethra:  normal appearing urethra with no masses, tenderness or lesions              Bartholin's and Skene's: normal                 Vagina: normal appearing vagina with normal color and discharge, no lesions              Cervix: normal, non tender, no lesions              Pap taken: No. Bimanual Exam:  Uterus:  normal size, contour, position, consistency, mobility, non-tender              Adnexa: normal adnexa and no mass, fullness, tenderness               Rectovaginal: Confirms               Anus:  normal sphincter tone, no lesions  A:  Well Woman with normal exam  Menopausal no HRT  Hypothyroid stable medication with PCP management, previous lobectomy for nodule  Under evaluation for elevated LFT's  Colonoscopy due, patient will schedule  P:   Reviewed health and wellness pertinent to exam  Aware of need to advise if vaginal bleeding  Continue MD follow up as indicated  Pap smear not taken today  counseled on breast self exam, mammography screening stressed yearly with 3 D, menopause, adequate intake of calcium and vitamin D, diet and exercise  return annually or prn  An After Visit Summary was printed and given to the patient.

## 2014-07-19 NOTE — Progress Notes (Signed)
Reviewed personally.  M. Suzanne Lurlene Ronda, MD.  

## 2014-07-20 ENCOUNTER — Other Ambulatory Visit: Payer: BC Managed Care – PPO

## 2014-08-05 ENCOUNTER — Encounter: Payer: Self-pay | Admitting: Internal Medicine

## 2014-08-05 ENCOUNTER — Ambulatory Visit (INDEPENDENT_AMBULATORY_CARE_PROVIDER_SITE_OTHER): Payer: BLUE CROSS/BLUE SHIELD | Admitting: Internal Medicine

## 2014-08-05 VITALS — BP 102/70 | HR 72 | Ht 59.0 in | Wt 110.5 lb

## 2014-08-05 DIAGNOSIS — R7989 Other specified abnormal findings of blood chemistry: Secondary | ICD-10-CM

## 2014-08-05 DIAGNOSIS — R945 Abnormal results of liver function studies: Principal | ICD-10-CM

## 2014-08-05 DIAGNOSIS — Z1211 Encounter for screening for malignant neoplasm of colon: Secondary | ICD-10-CM

## 2014-08-05 DIAGNOSIS — K5731 Diverticulosis of large intestine without perforation or abscess with bleeding: Secondary | ICD-10-CM

## 2014-08-05 DIAGNOSIS — K219 Gastro-esophageal reflux disease without esophagitis: Secondary | ICD-10-CM

## 2014-08-05 NOTE — Patient Instructions (Signed)
Please follow up with Dr. Perry as needed 

## 2014-08-05 NOTE — Progress Notes (Signed)
HISTORY OF PRESENT ILLNESS:  Kimberly Miles is a 59 y.o. female with the below listed medical history who was initially evaluated by myself, to establish after Dr. Buel Ream retirement, 07/03/2014 for ongoing management of GERD and evaluation of mild hepatic transaminases abnormalities. See that dictation. There was no evidence for chronic liver disease on physical examination. No obvious risk factors for liver disease. She underwent extensive testing for viral and nonviral causes for chronic mild elevation of hepatic transaminases. Evaluation was completely negative with intact hepatic synthetic function. She has had mild intermittent liver test abnormalities for at least 20 years. She brings documentation to today's office visit. She has multiple multiple questions and concerns. No new clinical issues since her last evaluation. Previous hepatic imaging unremarkable. Last colonoscopy and upper endoscopy October 2006. Colonoscopy revealed diverticulosis without polyps. Upper endoscopy was unremarkable with negative duodenal biopsies. She is due for routine screening colonoscopy this year. She is not interested citing concerns for complications. She did inquire about cologard  REVIEW OF SYSTEMS:  All non-GI ROS negative except for headaches  Past Medical History  Diagnosis Date  . Migraines 1998  . IBS (irritable bowel syndrome)   . Diverticulosis   . Colitis 1980  . Palpitations   . Family history of premature CAD 09/01/2013  . Elevated LFTs   . Vitamin B 12 deficiency   . Arthritis   . Basal cell carcinoma   . Thyroid cyst   . Hypothyroidism   . GERD (gastroesophageal reflux disease)     Past Surgical History  Procedure Laterality Date  . Breast biopsy Left 1979    benign  . Colposcopy  05/2009    CIN 1  . Cesarean section  1977, 1978    x 2  . Oophorectomy Right 1985    ruptured cyst   . Appendectomy    . Thyroidectomy, partial    . Myocardial perfusion study  04/07/2003   Normal Cardiolite   . 2d echocardiogram  05/09/2011    EF >55%, normal-mild    Social History Kimberly Miles  reports that she has never smoked. She has never used smokeless tobacco. She reports that she drinks alcohol. She reports that she does not use illicit drugs.  family history includes Ehlers-Danlos syndrome in her mother; Hyperlipidemia (age of onset: 43) in her mother; Hypertension in her maternal grandmother; Hypertension (age of onset: 42) in her mother; Kidney disease in her mother; Lung cancer (age of onset: 76) in her mother; Prostate cancer in her paternal grandfather; Stroke in her maternal grandfather; Uterine cancer in her maternal grandmother; Vascular Disease in her maternal grandmother.  Allergies  Allergen Reactions  . Codeine   . Erythromycin   . Sulfa Antibiotics   . Ampicillin Rash       PHYSICAL EXAMINATION: Vital signs: BP 102/70 mmHg  Pulse 72  Ht 4\' 11"  (1.499 m)  Wt 110 lb 8 oz (50.122 kg)  BMI 22.31 kg/m2  LMP 08/25/2010 General: Well-developed, well-nourished, no acute distress HEENT: Sclerae are anicteric, conjunctiva pink. Oral mucosa intact Lungs: Clear Heart: Regular Abdomen: soft, nontender, nondistended, no obvious ascites, no peritoneal signs, normal bowel sounds. No organomegaly. Extremities: No edema Psychiatric: alert and oriented x3. Cooperative   ASSESSMENT:  #1. Mild chronic elevation of hepatic transaminase intermittently. Negative extensive workup. No evidence for chronic liver disease or hepatic synthetic dysfunction. Could be fatty liver though no fatty infiltration on imaging and the patient is of ideal body weight. We did discuss the role of liver  biopsy, though felt it was highly unlikely to affect management. #2. Query GERD. Patient is on no medical therapy #3. Colonoscopy 2006 with diverticulosis #4. Health related anxiety  PLAN:  #1. Repeat liver tests in 6 months #2. Routine office follow-up in 1 year #3.  Colorectal neoplasia screening due around October 2016. Patient to decide on preferred method after extensive discussion today. We reviewed risks benefits of multiple different strategies in detail. #4. Resume general medical care with Dr. Etter Sjogren

## 2014-09-07 ENCOUNTER — Other Ambulatory Visit (INDEPENDENT_AMBULATORY_CARE_PROVIDER_SITE_OTHER): Payer: BLUE CROSS/BLUE SHIELD

## 2014-09-07 DIAGNOSIS — E0590 Thyrotoxicosis, unspecified without thyrotoxic crisis or storm: Secondary | ICD-10-CM

## 2014-09-07 LAB — TSH: TSH: 0.1 u[IU]/mL — ABNORMAL LOW (ref 0.35–4.50)

## 2014-09-08 ENCOUNTER — Other Ambulatory Visit: Payer: Self-pay

## 2014-09-08 MED ORDER — LEVOTHYROXINE SODIUM 88 MCG PO TABS: 88.0000 ug | ORAL_TABLET | Freq: Every day | ORAL | Status: DC

## 2014-10-07 ENCOUNTER — Telehealth: Payer: Self-pay | Admitting: Family Medicine

## 2014-10-07 DIAGNOSIS — E0590 Thyrotoxicosis, unspecified without thyrotoxic crisis or storm: Secondary | ICD-10-CM

## 2014-10-07 NOTE — Telephone Encounter (Signed)
Orders are in.     KP 

## 2014-10-07 NOTE — Telephone Encounter (Signed)
Relation to pt: self  Call back number: [phone]    Reason for call:  Pt states her thyroid level was abnormal in March and MD requested a repeat lab pt scheduled a lab appointment for 10/27/14. Requesting orders

## 2014-10-27 ENCOUNTER — Other Ambulatory Visit (INDEPENDENT_AMBULATORY_CARE_PROVIDER_SITE_OTHER): Payer: BLUE CROSS/BLUE SHIELD

## 2014-10-27 DIAGNOSIS — E0590 Thyrotoxicosis, unspecified without thyrotoxic crisis or storm: Secondary | ICD-10-CM

## 2014-10-27 LAB — TSH: TSH: 0.19 u[IU]/mL — ABNORMAL LOW (ref 0.35–4.50)

## 2014-10-29 ENCOUNTER — Other Ambulatory Visit: Payer: Self-pay

## 2014-10-29 MED ORDER — LEVOTHYROXINE SODIUM 75 MCG PO TABS: 75.0000 ug | ORAL_TABLET | Freq: Every day | ORAL | Status: DC

## 2014-10-30 ENCOUNTER — Telehealth: Payer: Self-pay

## 2014-10-30 ENCOUNTER — Other Ambulatory Visit: Payer: Self-pay

## 2014-10-30 DIAGNOSIS — N951 Menopausal and female climacteric states: Secondary | ICD-10-CM

## 2014-10-30 DIAGNOSIS — E039 Hypothyroidism, unspecified: Secondary | ICD-10-CM

## 2014-10-30 MED ORDER — ZOLPIDEM TARTRATE 5 MG PO TABS
5.0000 mg | ORAL_TABLET | Freq: Every evening | ORAL | Status: DC | PRN
Start: 2014-10-30 — End: 2022-01-12

## 2014-10-30 NOTE — Telephone Encounter (Signed)
Rx faxed.    KP 

## 2014-10-30 NOTE — Telephone Encounter (Signed)
Ok to refill x 1  

## 2014-10-30 NOTE — Telephone Encounter (Signed)
Ambien Requested Last filled by GYN 07/14/13, she only takes for flights #30 given with no refills Patient was seen  05/04/14  Please advise     KP

## 2014-11-09 ENCOUNTER — Encounter: Payer: Self-pay | Admitting: Family Medicine

## 2014-11-09 ENCOUNTER — Other Ambulatory Visit: Payer: Self-pay | Admitting: Family Medicine

## 2014-11-09 DIAGNOSIS — N951 Menopausal and female climacteric states: Secondary | ICD-10-CM

## 2014-11-09 NOTE — Addendum Note (Signed)
Addended by: Ewing Schlein on: 11/09/2014 10:46 AM   Modules accepted: Orders

## 2014-12-03 ENCOUNTER — Encounter: Payer: Self-pay | Admitting: Nurse Practitioner

## 2014-12-03 ENCOUNTER — Ambulatory Visit (INDEPENDENT_AMBULATORY_CARE_PROVIDER_SITE_OTHER): Payer: BLUE CROSS/BLUE SHIELD | Admitting: Nurse Practitioner

## 2014-12-03 VITALS — BP 118/80 | HR 71 | Temp 98.2°F | Resp 16 | Ht 58.5 in | Wt 112.0 lb

## 2014-12-03 DIAGNOSIS — R319 Hematuria, unspecified: Secondary | ICD-10-CM | POA: Diagnosis not present

## 2014-12-03 LAB — POCT URINALYSIS DIPSTICK
Bilirubin, UA: NEGATIVE
Glucose, UA: NEGATIVE
KETONES UA: NEGATIVE
NITRITE UA: NEGATIVE
PH UA: 7
PROTEIN UA: 100
SPEC GRAV UA: 1.015
Urobilinogen, UA: 0.2

## 2014-12-03 MED ORDER — NITROFURANTOIN MONOHYD MACRO 100 MG PO CAPS: 100.0000 mg | ORAL_CAPSULE | Freq: Two times a day (BID) | ORAL | Status: DC

## 2014-12-03 MED ORDER — PHENAZOPYRIDINE HCL 200 MG PO TABS: 200.0000 mg | ORAL_TABLET | Freq: Three times a day (TID) | ORAL | Status: DC | PRN

## 2014-12-03 NOTE — Patient Instructions (Signed)
Start antibiotic. Our office will call if we need to change the antibiotic. Take pyridium to relax bladder, caution: urine tears & sweat will be orange. Do not be alarmed! Sip hydrating fluids (water, juice, colorless soda, decaff tea) every hour to flush kidneys. Please let us know if symptoms do not resolve within 12 hours of last dose.   Urinary Tract Infection Urinary tract infections (UTIs) can develop anywhere along your urinary tract. Your urinary tract is your body's drainage system for removing wastes and extra water. Your urinary tract includes two kidneys, two ureters, a bladder, and a urethra. Your kidneys are a pair of bean-shaped organs. Each kidney is about the size of your fist. They are located below your ribs, one on each side of your spine. CAUSES Infections are caused by microbes, which are microscopic organisms, including fungi, viruses, and bacteria. These organisms are so small that they can only be seen through a microscope. Bacteria are the microbes that most commonly cause UTIs. SYMPTOMS  Symptoms of UTIs may vary by age and gender of the patient and by the location of the infection. Symptoms in young women typically include a frequent and intense urge to urinate and a painful, burning feeling in the bladder or urethra during urination. Older women and men are more likely to be tired, shaky, and weak and have muscle aches and abdominal pain. A fever may mean the infection is in your kidneys. Other symptoms of a kidney infection include pain in your back or sides below the ribs, nausea, and vomiting. DIAGNOSIS To diagnose a UTI, your caregiver will ask you about your symptoms. Your caregiver also will ask to provide a urine sample. The urine sample will be tested for bacteria and white blood cells. White blood cells are made by your body to help fight infection. TREATMENT  Typically, UTIs can be treated with medication. Because most UTIs are caused by a bacterial infection, they  usually can be treated with the use of antibiotics. The choice of antibiotic and length of treatment depend on your symptoms and the type of bacteria causing your infection. HOME CARE INSTRUCTIONS  If you were prescribed antibiotics, take them exactly as your caregiver instructs you. Finish the medication even if you feel better after you have only taken some of the medication.  Drink enough water and fluids to keep your urine clear or pale yellow.  Avoid caffeine, tea, and carbonated beverages. They tend to irritate your bladder.  Empty your bladder often. Avoid holding urine for long periods of time.  Empty your bladder before and after sexual intercourse.  After a bowel movement, women should cleanse from front to back. Use each tissue only once. SEEK MEDICAL CARE IF:   You have back pain.  You develop a fever.  Your symptoms do not begin to resolve within 3 days. SEEK IMMEDIATE MEDICAL CARE IF:   You have severe back pain or lower abdominal pain.  You develop chills.  You have nausea or vomiting.  You have continued burning or discomfort with urination. MAKE SURE YOU:   Understand these instructions.  Will watch your condition.  Will get help right away if you are not doing well or get worse. Document Released: 03/22/2005 Document Revised: 12/12/2011 Document Reviewed: 07/21/2011 [facility] Patient Information 2014 [place].

## 2014-12-03 NOTE — Progress Notes (Signed)
   Subjective:    Patient ID: Kimberly Miles, female    DOB: 19-Jun-1956, 59 y.o.   MRN: [id number]  Hematuria This is a new problem. The current episode started today. The problem is unchanged. She describes the hematuria as gross hematuria. She reports no clotting in her urine stream. The pain is mild. She describes her urine color as light pink. Irritative symptoms include frequency, nocturia and urgency. Obstructive symptoms do not include dribbling, incomplete emptying or an intermittent stream. Associated symptoms include dysuria. Pertinent negatives include no abdominal pain, chills, fever, flank pain, genital pain, hesitancy or nausea. She is sexually active.      Review of Systems  Constitutional: Negative for fever and chills.  Gastrointestinal: Negative for nausea and abdominal pain.  Genitourinary: Positive for dysuria, urgency, frequency, hematuria and nocturia. Negative for hesitancy, flank pain and incomplete emptying.       Objective:   Physical Exam  Constitutional: She is oriented to person, place, and time. She appears well-developed and well-nourished. No distress.  HENT:  Head: Normocephalic and atraumatic.  Eyes: Conjunctivae are normal. Right eye exhibits no discharge. Left eye exhibits no discharge.  Neck: Normal range of motion.  Cardiovascular: Normal rate.   No murmur heard. Pulmonary/Chest: Effort normal.  Abdominal: Soft. She exhibits no distension and no mass. There is no tenderness. There is no rebound and no guarding.  Musculoskeletal: She exhibits no tenderness (no CVA tenderness).  Neurological: She is alert and oriented to person, place, and time.  Skin: Skin is warm and dry.  Psychiatric: She has a normal mood and affect. Her behavior is normal. Thought content normal.  Vitals reviewed.         Assessment & Plan:  1. Hematuria Likely UTI - POCT urinalysis dipstick-lg blood, leuks - nitrofurantoin, macrocrystal-monohydrate, (MACROBID) 100  MG capsule; Take 1 capsule (100 mg total) by mouth 2 (two) times daily.  Dispense: 10 capsule; Refill: 0 - phenazopyridine (PYRIDIUM) 200 MG tablet; Take 1 tablet (200 mg total) by mouth 3 (three) times daily as needed for pain.  Dispense: 6 tablet; Refill: 0 - Urine culture  F/u PRN

## 2014-12-03 NOTE — Progress Notes (Signed)
Pre visit review using our clinic review tool, if applicable. No additional management support is needed unless otherwise documented below in the visit note. 

## 2014-12-05 LAB — URINE CULTURE: Colony Count: 30000

## 2014-12-07 ENCOUNTER — Telehealth: Payer: Self-pay | Admitting: Nurse Practitioner

## 2014-12-07 NOTE — Telephone Encounter (Signed)
Patient aware of results.  She states she is feeling better but she doesn't feel like it is completely gone.

## 2014-12-07 NOTE — Telephone Encounter (Signed)
pls call pt: Advise Urine culture confirms UTI. She is on appropriate ABX. Continue w/instructions given in ofc. Ask if feeling better.

## 2014-12-29 ENCOUNTER — Other Ambulatory Visit: Payer: Self-pay | Admitting: Family Medicine

## 2014-12-29 ENCOUNTER — Telehealth: Payer: Self-pay | Admitting: Family Medicine

## 2014-12-30 ENCOUNTER — Other Ambulatory Visit: Payer: BLUE CROSS/BLUE SHIELD

## 2014-12-30 ENCOUNTER — Other Ambulatory Visit: Payer: Self-pay

## 2014-12-30 MED ORDER — LEVOTHYROXINE SODIUM 75 MCG PO TABS: 75.0000 ug | ORAL_TABLET | Freq: Every day | ORAL | Status: DC

## 2014-12-30 NOTE — Telephone Encounter (Signed)
Pt calling in stating pharmacy could only fill for brand name with RX provided. She needs generic because she cannot afford brand name. Please resend or call pharmacy to dispense generic. Pt going out of town tomorrow morning and needs refill.

## 2014-12-30 NOTE — Telephone Encounter (Signed)
Rx was originally sent as Levothyroxine (DAW, dispense as written for generic), unsure why pharmacy can not fill. However, I have resent with notes to pharmacy to fill as GENERIC ONLY.

## 2015-02-09 ENCOUNTER — Other Ambulatory Visit (INDEPENDENT_AMBULATORY_CARE_PROVIDER_SITE_OTHER): Payer: BLUE CROSS/BLUE SHIELD

## 2015-02-09 DIAGNOSIS — E039 Hypothyroidism, unspecified: Secondary | ICD-10-CM | POA: Diagnosis not present

## 2015-02-09 LAB — TSH: TSH: 0.51 u[IU]/mL (ref 0.35–4.50)

## 2015-02-11 ENCOUNTER — Encounter: Payer: Self-pay | Admitting: Family Medicine

## 2015-02-12 MED ORDER — LEVOTHYROXINE SODIUM 75 MCG PO TABS: 75.0000 ug | ORAL_TABLET | Freq: Every day | ORAL | Status: DC

## 2015-02-13 ENCOUNTER — Ambulatory Visit (INDEPENDENT_AMBULATORY_CARE_PROVIDER_SITE_OTHER): Payer: BLUE CROSS/BLUE SHIELD | Admitting: Family Medicine

## 2015-02-13 ENCOUNTER — Encounter: Payer: Self-pay | Admitting: Family Medicine

## 2015-02-13 VITALS — BP 102/62 | HR 74 | Temp 97.7°F | Ht 58.5 in | Wt 109.0 lb

## 2015-02-13 DIAGNOSIS — N3000 Acute cystitis without hematuria: Secondary | ICD-10-CM | POA: Diagnosis not present

## 2015-02-13 DIAGNOSIS — R300 Dysuria: Secondary | ICD-10-CM | POA: Diagnosis not present

## 2015-02-13 LAB — POCT URINALYSIS DIPSTICK
BILIRUBIN UA: NEGATIVE
GLUCOSE UA: NEGATIVE
Ketones, UA: NEGATIVE
Leukocytes, UA: NEGATIVE
Nitrite, UA: NEGATIVE
Protein, UA: NEGATIVE
Urobilinogen, UA: NEGATIVE
pH, UA: 7

## 2015-02-13 MED ORDER — CIPROFLOXACIN HCL 500 MG PO TABS: 500.0000 mg | ORAL_TABLET | Freq: Two times a day (BID) | ORAL | Status: DC

## 2015-02-13 NOTE — Progress Notes (Signed)
   Subjective:    Patient ID: Kimberly Miles, female    DOB: 01-Oct-1955, 59 y.o.   MRN: [id number]  HPI Here for 2 days of urinary urgency and slight burning. No fever. She is drinking plenty of water. She was treated for an E coli UTI in June with 5 days of Macrobid. She felt better after that but she says she still had some slight symptoms ever since.   Review of Systems  Constitutional: Negative.   Respiratory: Negative.   Cardiovascular: Negative.   Gastrointestinal: Negative.   Genitourinary: Positive for dysuria, urgency and frequency. Negative for hematuria, flank pain and pelvic pain.       Objective:   Physical Exam  Constitutional: She appears well-developed and well-nourished.  Cardiovascular: Normal rate, regular rhythm, normal heart sounds and intact distal pulses.   Pulmonary/Chest: Effort normal and breath sounds normal.  Abdominal: Soft. Bowel sounds are normal. She exhibits no distension and no mass. There is no tenderness. There is no rebound and no guarding.          Assessment & Plan:  UTI, unclear if this is the same UTI from June or a new one. Treat with 7 days of Cipro. Recheck prn

## 2015-02-13 NOTE — Progress Notes (Signed)
Pre visit review using our clinic review tool, if applicable. No additional management support is needed unless otherwise documented below in the visit note. 

## 2015-05-14 ENCOUNTER — Encounter: Payer: Self-pay | Admitting: Family

## 2015-05-14 ENCOUNTER — Ambulatory Visit (INDEPENDENT_AMBULATORY_CARE_PROVIDER_SITE_OTHER): Payer: BLUE CROSS/BLUE SHIELD | Admitting: Family

## 2015-05-14 VITALS — HR 62 | Temp 97.8°F | Resp 18 | Ht 58.5 in | Wt 112.8 lb

## 2015-05-14 DIAGNOSIS — N3001 Acute cystitis with hematuria: Secondary | ICD-10-CM

## 2015-05-14 DIAGNOSIS — R3915 Urgency of urination: Secondary | ICD-10-CM | POA: Diagnosis not present

## 2015-05-14 LAB — POCT URINALYSIS DIPSTICK
BILIRUBIN UA: NEGATIVE
GLUCOSE UA: NEGATIVE
KETONES UA: NEGATIVE
Leukocytes, UA: NEGATIVE
NITRITE UA: NEGATIVE
PH UA: 7
Protein, UA: NEGATIVE
SPEC GRAV UA: 1.015
Urobilinogen, UA: NEGATIVE

## 2015-05-14 MED ORDER — CIPROFLOXACIN HCL 250 MG PO TABS: 250.0000 mg | ORAL_TABLET | Freq: Two times a day (BID) | ORAL | Status: DC

## 2015-05-14 NOTE — Patient Instructions (Signed)
Start Cipro for urinary tract infection. Call if symptoms worsen or if not improved in 3 days.

## 2015-05-14 NOTE — Progress Notes (Signed)
Subjective:    Patient ID: Kimberly Miles, female    DOB: 15-Jan-1956, 59 y.o.   MRN: [id number]  HPI  Kimberly Miles is a 59 yr old female who presents today with chief complaint of urinary urgency and dysuria.  Symptoms began yesterday.  She has increased fluids, started probiotic and used essential oils. She reports improvement in her symptoms with these measures. Denies fever, hematuria or unusual low back pain. She denies pelvic pain.    Review of Systems See HPI  Past Medical History  Diagnosis Date  . Migraines 1998  . IBS (irritable bowel syndrome)   . Diverticulosis   . Colitis 1980  . Palpitations   . Family history of premature CAD 09/01/2013  . Elevated LFTs   . Vitamin B 12 deficiency   . Arthritis   . Basal cell carcinoma   . Thyroid cyst   . Hypothyroidism   . GERD (gastroesophageal reflux disease)     Social History   Social History  . Marital Status: Married    Spouse Name: N/A  . Number of Children: 2  . Years of Education: N/A   Occupational History  . retired    Social History Main Topics  . Smoking status: Never Smoker   . Smokeless tobacco: Never Used  . Alcohol Use: 0.0 oz/week    0 Standard drinks or equivalent per week  . Drug Use: No  . Sexual Activity:    Partners: Male    Birth Control/ Protection: Post-menopausal   Other Topics Concern  . Not on file   Social History Narrative    Past Surgical History  Procedure Laterality Date  . Breast biopsy Left 1979    benign  . Colposcopy  05/2009    CIN 1  . Cesarean section  1977, 1978    x 2  . Oophorectomy Right 1985    ruptured cyst   . Appendectomy    . Thyroidectomy, partial    . Myocardial perfusion study  04/07/2003    Normal Cardiolite   . 2d echocardiogram  05/09/2011    EF >55%, normal-mild    Family History  Problem Relation Age of Onset  . Lung cancer Mother 39  . Hypertension Mother 67  . Hyperlipidemia Mother 46  . Hypertension Maternal Grandmother   .  Uterine cancer Maternal Grandmother   . Vascular Disease Maternal Grandmother   . Stroke Maternal Grandfather   . Prostate cancer Paternal Grandfather   . Ehlers-Danlos syndrome Mother   . Kidney disease Mother     Allergies  Allergen Reactions  . Codeine Nausea Only  . Erythromycin     Stomach upset  . Sulfa Antibiotics     ?reaction  . Ampicillin Rash    Current Outpatient Prescriptions on File Prior to Visit  Medication Sig Dispense Refill  . baclofen (LIORESAL) 10 MG tablet Take 1 tablet by mouth 2 (two) times daily as needed.  0  . cyclobenzaprine (FLEXERIL) 5 MG tablet Take 1 tablet by mouth daily as needed.     Marland Kitchen levothyroxine (SYNTHROID, LEVOTHROID) 75 MCG tablet Take 1 tablet (75 mcg total) by mouth daily before breakfast. 90 tablet 1  . MAGNESIUM GLYCINATE PLUS PO Take 450 mg by mouth daily.    Marland Kitchen zolpidem (AMBIEN) 5 MG tablet Take 1 tablet (5 mg total) by mouth at bedtime as needed for sleep. 30 tablet 0  . Cholecalciferol (VITAMIN D3) 5000 UNITS CAPS Take 1 capsule by mouth daily.  No current facility-administered medications on file prior to visit.    Pulse 62  Temp(Src) 97.8 F (36.6 C) (Oral)  Resp 18  Ht 4' 10.5" (1.486 m)  Wt 112 lb 12.8 oz (51.166 kg)  BMI 23.17 kg/m2  SpO2 100%  LMP 08/25/2010       Objective:   Physical Exam  Constitutional: She is oriented to person, place, and time. She appears well-developed and well-nourished.  Cardiovascular: Normal rate, regular rhythm and normal heart sounds.   No murmur heard. Pulmonary/Chest: Effort normal and breath sounds normal. No respiratory distress. She has no wheezes.  Abdominal: Bowel sounds are normal. There is no tenderness.  Musculoskeletal: She exhibits no edema.  Neurological: She is alert and oriented to person, place, and time.  Psychiatric: She has a normal mood and affect. Her behavior is normal. Judgment and thought content normal.          Assessment & Plan:  UTI- trace  blood on UA today. Will send urine for culture, and add cipro

## 2015-05-14 NOTE — Progress Notes (Signed)
Pre visit review using our clinic review tool, if applicable. No additional management support is needed unless otherwise documented below in the visit note. 

## 2015-05-14 NOTE — Addendum Note (Signed)
Addended by: Harl Bowie on: 05/14/2015 02:20 PM   Modules accepted: Orders

## 2015-05-16 ENCOUNTER — Encounter: Payer: Self-pay | Admitting: Family

## 2015-05-16 LAB — URINE CULTURE

## 2015-07-21 ENCOUNTER — Telehealth: Payer: Self-pay | Admitting: Family Medicine

## 2015-07-21 ENCOUNTER — Encounter: Payer: Self-pay | Admitting: Certified Nurse Midwife

## 2015-07-21 ENCOUNTER — Ambulatory Visit (INDEPENDENT_AMBULATORY_CARE_PROVIDER_SITE_OTHER): Payer: BLUE CROSS/BLUE SHIELD | Admitting: Certified Nurse Midwife

## 2015-07-21 VITALS — BP 104/64 | HR 68 | Resp 16 | Ht 58.75 in | Wt 114.0 lb

## 2015-07-21 DIAGNOSIS — Z0000 Encounter for general adult medical examination without abnormal findings: Secondary | ICD-10-CM

## 2015-07-21 DIAGNOSIS — Z124 Encounter for screening for malignant neoplasm of cervix: Secondary | ICD-10-CM

## 2015-07-21 DIAGNOSIS — Z01419 Encounter for gynecological examination (general) (routine) without abnormal findings: Secondary | ICD-10-CM | POA: Diagnosis not present

## 2015-07-21 LAB — POCT URINALYSIS DIPSTICK
Bilirubin, UA: NEGATIVE
Blood, UA: NEGATIVE
Glucose, UA: NEGATIVE
Ketones, UA: NEGATIVE
LEUKOCYTES UA: NEGATIVE
Nitrite, UA: NEGATIVE
PROTEIN UA: NEGATIVE
UROBILINOGEN UA: NEGATIVE
pH, UA: 5

## 2015-07-21 NOTE — Progress Notes (Signed)
60 y.o. G2P2 Married  Caucasian Fe here for annual exam. Menopausal no HRT. Denies vaginal bleeding. Denies vaginal dryness. Sees Dr. Etter Sjogren PCP for aex/labs and hypothyroid management. Patient plans to do Cologard with PCP. Has random sensation in both breast of milk letting down, has noted for the past year. Denies nipple discharge, skin change or masses in breast. Not sure if cyclic, not related to sexual activity. No other health issues today.  Patient's last menstrual period was 08/25/2010.          Sexually active: Yes.    The current method of family planning is tubal ligation.    Exercising: No.  exercise Smoker:  no  Health Maintenance: Pap:  07-11-13 neg HPV HR neg MMG: 10-12-14 category c density,birads 1:neg Colonoscopy: 2006, pt declines further  BMD:   10/08 TDaP:  2015 Shingles: no Pneumonia: 2015 Hep C and HIV: Hep C 2015 neg, HIV not done Labs: Poct urine-neg Self breast exam: done monthly   reports that she has never smoked. She has never used smokeless tobacco. She reports that she drinks alcohol. She reports that she does not use illicit drugs.  Past Medical History  Diagnosis Date  . Migraines 1998  . IBS (irritable bowel syndrome)   . Diverticulosis   . Colitis 1980  . Palpitations   . Family history of premature CAD 09/01/2013  . Elevated LFTs   . Vitamin B 12 deficiency   . Arthritis   . Basal cell carcinoma   . Thyroid cyst   . Hypothyroidism   . GERD (gastroesophageal reflux disease)     Past Surgical History  Procedure Laterality Date  . Breast biopsy Left 1979    benign  . Colposcopy  05/2009    CIN 1  . Cesarean section  1977, 1978    x 2  . Oophorectomy Right 1985    ruptured cyst   . Appendectomy    . Thyroidectomy, partial    . Myocardial perfusion study  04/07/2003    Normal Cardiolite   . 2d echocardiogram  05/09/2011    EF >55%, normal-mild    Current Outpatient Prescriptions  Medication Sig Dispense Refill  . Ascorbic Acid  (VITAMIN C PO) Take by mouth daily.    . baclofen (LIORESAL) 10 MG tablet Take 1 tablet by mouth 2 (two) times daily as needed.  0  . Cholecalciferol (VITAMIN D3) 5000 UNITS CAPS Take 1 capsule by mouth daily.    . cyclobenzaprine (FLEXERIL) 5 MG tablet Take 1 tablet by mouth daily as needed.     Marland Kitchen levothyroxine (SYNTHROID, LEVOTHROID) 75 MCG tablet Take 1 tablet (75 mcg total) by mouth daily before breakfast. 90 tablet 1  . MAGNESIUM GLYCINATE PLUS PO Take 450 mg by mouth daily.    . TURMERIC PO Take by mouth daily.    Marland Kitchen zolpidem (AMBIEN) 5 MG tablet Take 1 tablet (5 mg total) by mouth at bedtime as needed for sleep. 30 tablet 0   No current facility-administered medications for this visit.    Family History  Problem Relation Age of Onset  . Lung cancer Mother 37  . Hypertension Mother 82  . Hyperlipidemia Mother 102  . Ehlers-Danlos syndrome Mother   . Kidney disease Mother   . Hypertension Maternal Grandmother   . Uterine cancer Maternal Grandmother   . Vascular Disease Maternal Grandmother   . Stroke Maternal Grandfather   . Prostate cancer Paternal Grandfather   . Other Father  bypass surgery times 2    ROS:  Pertinent items are noted in HPI.  Otherwise, a comprehensive ROS was negative.  Exam:   BP 104/64 mmHg  Pulse 68  Resp 16  Ht 4' 10.75" (1.492 m)  Wt 114 lb (51.71 kg)  BMI 23.23 kg/m2  LMP 08/25/2010 Height: 4' 10.75" (149.2 cm) Ht Readings from Last 3 Encounters:  07/21/15 4' 10.75" (1.492 m)  05/14/15 4' 10.5" (1.486 m)  02/13/15 4' 10.5" (1.486 m)    General appearance: alert, cooperative and appears stated age Head: Normocephalic, without obvious abnormality, atraumatic Neck: no adenopathy, supple, symmetrical, trachea midline and thyroid normal to inspection and palpation Lungs: clear to auscultation bilaterally Breasts: normal appearance, no masses or tenderness, No nipple retraction or dimpling, No nipple discharge or bleeding, No axillary or  supraclavicular adenopathy Heart: regular rate and rhythm Abdomen: soft, non-tender; no masses,  no organomegaly Extremities: extremities normal, atraumatic, no cyanosis or edema Skin: Skin color, texture, turgor normal. No rashes or lesions Lymph nodes: Cervical, supraclavicular, and axillary nodes normal. No abnormal inguinal nodes palpated Neurologic: Grossly normal   Pelvic: External genitalia:  no lesions              Urethra:  normal appearing urethra with no masses, tenderness or lesions              Bartholin's and Skene's: normal                 Vagina: normal appearing vagina with normal color and discharge, no lesions              Cervix: normal,non tender, no lesions              Pap taken: Yes.   Bimanual Exam:  Uterus:  normal size, contour, position, consistency, mobility, non-tender              Adnexa: normal adnexa and no mass, fullness, tenderness               Rectovaginal: Confirms               Anus:  normal sphincter tone, no lesions  Chaperone present: yes  A:  Well Woman with normal exam  Menopausal no HRT  Breast sensation of milk let down  Colonoscopy due, patient declines scheduling and plans Cologard with PCP  BMD due will schedule with Mammogram    P:   Reviewed health and wellness pertinent to exam  Discussed importance of notifying if vaginal bleeding  Discussed no uncommon for women to have this sensation, but recommend Prolactin level  Lab Prolactin, Vitamin D  Aware of benefits and risks of colonoscopy declines scheduling  Pap smear as above with HPV reflex   counseled on breast self exam, mammography screening, menopause, adequate intake of calcium and vitamin D, diet and exercise  return annually or prn  An After Visit Summary was printed and given to the patient.

## 2015-07-21 NOTE — Patient Instructions (Addendum)

## 2015-07-21 NOTE — Telephone Encounter (Signed)
flu

## 2015-07-22 LAB — VITAMIN D 25 HYDROXY (VIT D DEFICIENCY, FRACTURES): Vit D, 25-Hydroxy: 60 ng/mL (ref 30–100)

## 2015-07-22 LAB — PROLACTIN: PROLACTIN: 5.9 ng/mL

## 2015-07-23 LAB — IPS PAP TEST WITH REFLEX TO HPV

## 2015-07-25 NOTE — Progress Notes (Signed)
Reviewed personally.  M. Suzanne Karimah Winquist, MD.  

## 2015-08-04 ENCOUNTER — Encounter: Payer: Self-pay | Admitting: Medical

## 2015-08-04 ENCOUNTER — Ambulatory Visit (INDEPENDENT_AMBULATORY_CARE_PROVIDER_SITE_OTHER): Payer: BLUE CROSS/BLUE SHIELD | Admitting: Medical

## 2015-08-04 ENCOUNTER — Ambulatory Visit (HOSPITAL_BASED_OUTPATIENT_CLINIC_OR_DEPARTMENT_OTHER)
Admission: RE | Admit: 2015-08-04 | Discharge: 2015-08-04 | Disposition: A | Discharge status: Home / Self Care | Payer: BLUE CROSS/BLUE SHIELD | Source: Ambulatory Visit | Attending: Medical | Admitting: Medical

## 2015-08-04 VITALS — BP 118/72 | HR 80 | Temp 98.2°F | Ht 58.75 in | Wt 114.0 lb

## 2015-08-04 DIAGNOSIS — R05 Cough: Secondary | ICD-10-CM | POA: Diagnosis not present

## 2015-08-04 DIAGNOSIS — K219 Gastro-esophageal reflux disease without esophagitis: Secondary | ICD-10-CM

## 2015-08-04 DIAGNOSIS — R059 Cough, unspecified: Secondary | ICD-10-CM

## 2015-08-04 MED ORDER — LORATADINE 10 MG PO TABS: 10.0000 mg | ORAL_TABLET | Freq: Every day | ORAL | Status: DC

## 2015-08-04 MED ORDER — OMEPRAZOLE 20 MG PO CPDR: 20.0000 mg | DELAYED_RELEASE_CAPSULE | Freq: Every day | ORAL | Status: DC

## 2015-08-04 NOTE — Progress Notes (Signed)
Subjective:    Patient ID: Kimberly Miles, female    DOB: 12/02/1955, 60 y.o.   MRN: [id number]  HPI  Pt in with cough. She states has been going on for a month. She states will come and go. States sometimes will come on randomly when gets up from sitting. Sometimes going into stores will get sensation of coughing. No night time cough. No hx of asthma. No wheezing. Mild sneezing(rare) recently. No itching eyes. No pnd.   Pt does have some mild stomach pain recently. Sunday after ruth crisp had heart burn(some recent kfc and [place] also recenlty). Over past month mild upset stomach.  Pt denies hx of spring allergies but does notice over past years. Some mild eye irritation.   Pt does mention jan 2 and 3rd mild cold. Week after uri cough started.   No history of smoking for herself. But both her parents have smoked in the past.  Review of Systems  Constitutional: Negative for fever, chills and fatigue.  HENT: Negative for congestion, facial swelling, sinus pressure, sneezing and sore throat.   Respiratory: Positive for cough. Negative for shortness of breath and wheezing.   Cardiovascular: Negative for chest pain and palpitations.  Gastrointestinal: Negative for abdominal pain.  Musculoskeletal: Negative for back pain.  Neurological: Negative for dizziness and headaches.  Hematological: Negative for adenopathy. Does not bruise/bleed easily.  Psychiatric/Behavioral: Negative for behavioral problems and confusion.   Past Medical History  Diagnosis Date  . Migraines 1998  . IBS (irritable bowel syndrome)   . Diverticulosis   . Colitis 1980  . Palpitations   . Family history of premature CAD 09/01/2013  . Elevated LFTs   . Vitamin B 12 deficiency   . Arthritis   . Basal cell carcinoma   . Thyroid cyst   . Hypothyroidism   . GERD (gastroesophageal reflux disease)     Social History   Social History  . Marital Status: Married    Spouse Name: N/A  . Number of Children: 2    . Years of Education: N/A   Occupational History  . retired    Social History Main Topics  . Smoking status: Never Smoker   . Smokeless tobacco: Never Used  . Alcohol Use: 0.0 oz/week    0 Standard drinks or equivalent per week  . Drug Use: No  . Sexual Activity:    Partners: Male    Birth Control/ Protection: Post-menopausal     Comment: BTL   Other Topics Concern  . Not on file   Social History Narrative    Past Surgical History  Procedure Laterality Date  . Breast biopsy Left 1979    benign  . Colposcopy  05/2009    CIN 1  . Cesarean section  1977, 1978    x 2  . Oophorectomy Right 1985    ruptured cyst   . Appendectomy    . Thyroidectomy, partial    . Myocardial perfusion study  04/07/2003    Normal Cardiolite   . 2d echocardiogram  05/09/2011    EF >55%, normal-mild    Family History  Problem Relation Age of Onset  . Lung cancer Mother 34  . Hypertension Mother 84  . Hyperlipidemia Mother 41  . Ehlers-Danlos syndrome Mother   . Kidney disease Mother   . Hypertension Maternal Grandmother   . Uterine cancer Maternal Grandmother   . Vascular Disease Maternal Grandmother   . Stroke Maternal Grandfather   . Prostate cancer Paternal Grandfather   .  Other Father     bypass surgery times 2    Allergies  Allergen Reactions  . Codeine Nausea Only  . Erythromycin     Stomach upset  . Sulfa Antibiotics     ?reaction  . Ampicillin Rash    Current Outpatient Prescriptions on File Prior to Visit  Medication Sig Dispense Refill  . Ascorbic Acid (VITAMIN C PO) Take by mouth daily.    . baclofen (LIORESAL) 10 MG tablet Take 1 tablet by mouth 2 (two) times daily as needed.  0  . Cholecalciferol (VITAMIN D3) 5000 UNITS CAPS Take 1 capsule by mouth daily.    . cyclobenzaprine (FLEXERIL) 5 MG tablet Take 1 tablet by mouth daily as needed.     Marland Kitchen levothyroxine (SYNTHROID, LEVOTHROID) 75 MCG tablet Take 1 tablet (75 mcg total) by mouth daily before breakfast. 90  tablet 1  . MAGNESIUM GLYCINATE PLUS PO Take 450 mg by mouth daily.    . TURMERIC PO Take by mouth daily.    Marland Kitchen zolpidem (AMBIEN) 5 MG tablet Take 1 tablet (5 mg total) by mouth at bedtime as needed for sleep. 30 tablet 0   No current facility-administered medications on file prior to visit.    BP 118/72 mmHg  Pulse 80  Temp(Src) 98.2 F (36.8 C) (Oral)  Ht 4' 10.75" (1.492 m)  Wt 114 lb (51.71 kg)  BMI 23.23 kg/m2  SpO2 98%  LMP 08/25/2010       Objective:   Physical Exam  General  Mental Status - Alert. General Appearance - Well groomed. Not in acute distress.  Skin Rashes- No Rashes.  HEENT Head- Normal. Ear Auditory Canal - Left- Normal. Right - Normal.Tympanic Membrane- Left- Normal. Right- Normal. Eye Sclera/Conjunctiva- Left- Normal. Right- Normal. Nose & Sinuses Nasal Mucosa- Left-   Faint mild boggy . Right-  Faint mild Boggy  Bilateral no  maxillary and  No frontal sinus pressure. Mouth & Throat Lips: Upper Lip- Normal: no dryness, cracking, pallor, cyanosis, or vesicular eruption. Lower Lip-Normal: no dryness, cracking, pallor, cyanosis or vesicular eruption. Buccal Mucosa- Bilateral- No Aphthous ulcers. Oropharynx- No Discharge or Erythema. Faint mild PND Tonsils: Characteristics- Bilateral- No Erythema or Congestion. Size/Enlargement- Bilateral- No enlargement. Discharge- bilateral-None.  Neck Neck- Supple. No Masses.   Chest and Lung Exam Auscultation: Breath Sounds:-Clear even and unlabored.  Cardiovascular Auscultation:Rythm- Regular, rate and rhythm. Murmurs & Other Heart Sounds:Ausculatation of the heart reveal- No Murmurs.  Lymphatic Head & Neck General Head & Neck Lymphatics: Bilateral: Description- No Localized lymphadenopathy.       Assessment & Plan:  Based on your recent  cough history and description, I think treating for reflux related cough would be first place to start. But also consider use of antihistamine in near future if  cough does not improve with omeprazole and healthy diet.  Since cough has been greater than 3 weeks and some second hand exposure growing up will get cxr today.  Reviewed most common cause of cough today.   Follow up in  3 weeks or as needed  Made claritin available to start in 10-14 days but use PPI first as discussed

## 2015-08-04 NOTE — Patient Instructions (Addendum)
Based on your recent  cough history and description, I think treating for cough related to reflux  would be first place to start. But also consider use of antihistamine in near future if cough does not improve with omeprazole and healthy diet.  Since cough has been greater than 3 weeks and some second hand exposure growing up will get cxr today.  Reviewed most common cause of cough today.   Follow up in  3 weeks or as needed

## 2015-08-04 NOTE — Progress Notes (Signed)
Pre visit review using our clinic review tool, if applicable. No additional management support is needed unless otherwise documented below in the visit note. 

## 2015-08-05 NOTE — Progress Notes (Signed)
Quick Note:  Pt has seen results on MyChart and message also sent for patient to call back if any questions. ______ 

## 2015-08-16 ENCOUNTER — Other Ambulatory Visit: Payer: Self-pay | Admitting: Family Medicine

## 2015-09-04 ENCOUNTER — Other Ambulatory Visit: Payer: Self-pay | Admitting: Family Medicine

## 2015-09-07 ENCOUNTER — Encounter: Payer: Self-pay | Admitting: Physician Assistant

## 2015-09-07 ENCOUNTER — Telehealth: Payer: Self-pay | Admitting: Family Medicine

## 2015-09-07 ENCOUNTER — Ambulatory Visit (INDEPENDENT_AMBULATORY_CARE_PROVIDER_SITE_OTHER): Payer: BLUE CROSS/BLUE SHIELD | Admitting: Physician Assistant

## 2015-09-07 VITALS — BP 106/80 | HR 82 | Temp 98.3°F | Ht 58.75 in | Wt 114.6 lb

## 2015-09-07 DIAGNOSIS — R319 Hematuria, unspecified: Secondary | ICD-10-CM | POA: Diagnosis not present

## 2015-09-07 DIAGNOSIS — N390 Urinary tract infection, site not specified: Secondary | ICD-10-CM | POA: Insufficient documentation

## 2015-09-07 LAB — POC URINALSYSI DIPSTICK (AUTOMATED)
Bilirubin, UA: NEGATIVE
GLUCOSE UA: NEGATIVE
Ketones, UA: NEGATIVE
NITRITE UA: POSITIVE
Spec Grav, UA: 1.025
UROBILINOGEN UA: 1
pH, UA: 5.5

## 2015-09-07 LAB — URINALYSIS, MICROSCOPIC ONLY

## 2015-09-07 MED ORDER — CIPROFLOXACIN HCL 500 MG PO TABS: 500.0000 mg | ORAL_TABLET | Freq: Two times a day (BID) | ORAL | Status: DC

## 2015-09-07 NOTE — Telephone Encounter (Signed)
Pt called in stating that she thinks she has uti that started yesterday. She said today she has blood in her urine. Transferred to [facility] with [facility].

## 2015-09-07 NOTE — Progress Notes (Signed)
Patient presents to clinic today c/o 1 day urinary urgency and frequency, dysuria. Endorses hematuria noted this morning. Denies fever, chills, nausea or vomiting. Denies abdominal pain or back pain. Endorses history of UTI especially after intercourse with her husband.  Past Medical History  Diagnosis Date  . Migraines 1998  . IBS (irritable bowel syndrome)   . Diverticulosis   . Colitis 1980  . Palpitations   . Family history of premature CAD 09/01/2013  . Elevated LFTs   . Vitamin B 12 deficiency   . Arthritis   . Basal cell carcinoma   . Thyroid cyst   . Hypothyroidism   . GERD (gastroesophageal reflux disease)     Current Outpatient Prescriptions on File Prior to Visit  Medication Sig Dispense Refill  . Ascorbic Acid (VITAMIN C PO) Take by mouth daily.    Marland Kitchen b complex vitamins tablet Take 1 tablet by mouth daily.    . baclofen (LIORESAL) 10 MG tablet Take 1 tablet by mouth 2 (two) times daily as needed.  0  . Cholecalciferol (VITAMIN D3) 5000 UNITS CAPS Take 1 capsule by mouth daily.    . cyclobenzaprine (FLEXERIL) 5 MG tablet Take 1 tablet by mouth daily as needed.     Marland Kitchen levothyroxine (SYNTHROID, LEVOTHROID) 75 MCG tablet TAKE 1 TABLET (75 MCG TOTAL) BY MOUTH DAILY BEFORE BREAKFAST. 90 tablet 1  . loratadine (CLARITIN) 10 MG tablet Take 1 tablet (10 mg total) by mouth daily. 30 tablet 0  . MAGNESIUM GLYCINATE PLUS PO Take 450 mg by mouth daily.    Marland Kitchen zolpidem (AMBIEN) 5 MG tablet Take 1 tablet (5 mg total) by mouth at bedtime as needed for sleep. 30 tablet 0   No current facility-administered medications on file prior to visit.    Allergies  Allergen Reactions  . Codeine Nausea Only  . Erythromycin     Stomach upset  . Sulfa Antibiotics     ?reaction  . Ampicillin Rash    Family History  Problem Relation Age of Onset  . Lung cancer Mother 5  . Hypertension Mother 47  . Hyperlipidemia Mother 9  . Ehlers-Danlos syndrome Mother   . Kidney disease Mother   .  Hypertension Maternal Grandmother   . Uterine cancer Maternal Grandmother   . Vascular Disease Maternal Grandmother   . Stroke Maternal Grandfather   . Prostate cancer Paternal Grandfather   . Other Father     bypass surgery times 2    Social History   Social History  . Marital Status: Married    Spouse Name: N/A  . Number of Children: 2  . Years of Education: N/A   Occupational History  . retired    Social History Main Topics  . Smoking status: Never Smoker   . Smokeless tobacco: Never Used  . Alcohol Use: 0.0 oz/week    0 Standard drinks or equivalent per week  . Drug Use: No  . Sexual Activity:    Partners: Male    Birth Control/ Protection: Post-menopausal     Comment: BTL   Other Topics Concern  . None   Social History Narrative   Review of Systems - See HPI.  All other ROS are negative.  BP 106/80 mmHg  Pulse 82  Temp(Src) 98.3 F (36.8 C) (Oral)  Ht 4' 10.75" (1.492 m)  Wt 114 lb 9.6 oz (51.982 kg)  BMI 23.35 kg/m2  SpO2 99%  LMP 08/25/2010  Physical Exam  Constitutional: She is oriented to person,  place, and time and well-developed, well-nourished, and in no distress.  HENT:  Head: Normocephalic and atraumatic.  Eyes: Conjunctivae are normal.  Neck: Neck supple.  Cardiovascular: Normal rate, regular rhythm, normal heart sounds and intact distal pulses.   Pulmonary/Chest: Effort normal and breath sounds normal. No respiratory distress. She has no wheezes. She has no rales. She exhibits no tenderness.  Abdominal: Soft. Bowel sounds are normal. She exhibits no distension. There is no tenderness.  Neurological: She is alert and oriented to person, place, and time.  Skin: Skin is warm and dry. No rash noted.  Psychiatric: Affect normal.  Vitals reviewed.  Recent Results (from the past 2160 hour(s))  POCT Urinalysis Dipstick     Status: None   Collection Time: 07/21/15 12:56 PM  Result Value Ref Range   Color, UA yellow    Clarity, UA clear     Glucose, UA n    Bilirubin, UA n    Ketones, UA n    Spec Grav, UA     Blood, UA n    pH, UA 5.0    Protein, UA n    Urobilinogen, UA negative    Nitrite, UA n    Leukocytes, UA Negative Negative  Prolactin     Status: None   Collection Time: 07/21/15  1:53 PM  Result Value Ref Range   Prolactin 5.9 ng/mL    Comment:      Reference Ranges:                  Female:                       2.1 -  17.1 ng/ml                  Female:   Pregnant          9.7 - 208.5 ng/mL                            Non Pregnant      2.8 -  29.2 ng/mL                            Post Menopausal   1.8 -  20.3 ng/mL                      VITAMIN D 25 Hydroxy (Vit-D Deficiency, Fractures)     Status: None   Collection Time: 07/21/15  1:53 PM  Result Value Ref Range   Vit D, 25-Hydroxy 60 30 - 100 ng/mL    Comment: Vitamin D Status           25-OH Vitamin D        Deficiency                <20 ng/mL        Insufficiency         20 - 29 ng/mL        Optimal             > or = 30 ng/mL   For 25-OH Vitamin D testing on patients on D2-supplementation and patients for whom quantitation of D2 and D3 fractions is required, the QuestAssureD 25-OH VIT D, (D2,D3), LC/MS/MS is recommended: order code [id number] (patients > 2 yrs).   PAP with Reflex to HPV (IPS)  Status: None   Collection Time: 07/21/15  1:54 PM  Result Value Ref Range   COMMENTS: [facility]     Comment: [place], [place] [address] [place] [address] GYN CYTOLOGY REPORT  PATIENT NAME:Kimberly Miles, Kimberly Miles PATHOLOGY#:[id number]: F DOB: 1956/04/18 (Age: 77) MEDICAL RECORD [id number] DOCTOR:Debbie Hollice Espy, CNM DATE OBTAINED:1/25/2017CLIENT:[facility] RECEIVED:1/26/2017OTHER PHYS: DATE SIGNED:07/23/2015 PAP Thinlayer with Reflex to HPV Final Cytologic Interpretation:       Cervical, ThinLayer with Automated Imaging and Dual Review, CPT 88175      Negative for Intraepithelial  Lesions or Malignancy.       ADEQUACY OF SPECIMEN:           Satisfactory for evaluation. Endocervical cells/transformation zone component identified.              OTHER CYTOLOGIC FINDINGS:            Marked/Severe Inflammation.       NOTE: This Pap test has been evaluated with computer assisted technology.       Electronically signed by: PAL, CT(ASCP), [address] [place] [address] (Med. Dir.: Darren Wirthwein) pal/1/27/2017The Pap tes t is a screening mechanism with excellent but not perfect ability to prevent cervical carcinoma.  It has a low, but  significant, diagnostic error rate. The pap test is suboptimal  for detection of glandular lesions.  It should be noted that a negative result does not definitively rule out the presence of disease.Ref: DeMay, RM, The Art and Science of Cytopathology,  [employer], [id number]. Last Menstrual Period: 08/25/2010 Menstrual/Pregnancy History: Post-menopausal   Technical processing performed at [facility], [address] [place], [place] [address], [place].     Assessment/Plan: Urinary tract infectious disease Urine dip with LE, Nitrites and blood. Exam within normal limits. Will send for culture. Will begin Cipro 500 mg BID x 5 days. Supportive measures reviewed. Will alter regimen based on results.

## 2015-09-07 NOTE — Assessment & Plan Note (Signed)
Urine dip with LE, Nitrites and blood. Exam within normal limits. Will send for culture. Will begin Cipro 500 mg BID x 5 days. Supportive measures reviewed. Will alter regimen based on results.

## 2015-09-07 NOTE — Patient Instructions (Signed)
Your symptoms are consistent with a bladder infection, also called acute cystitis. Please take your antibiotic (cipro) as directed until all pills are gone.  Stay very well hydrated.  Consider a daily probiotic (Align, Culturelle, or Activia) to help prevent stomach upset caused by the antibiotic.  Taking a probiotic daily may also help prevent recurrent UTIs.  Also consider taking AZO (Phenazopyridine) tablets to help decrease pain with urination.  I will call you with your urine testing results.  We will change antibiotics if indicated.  Call or return to clinic if symptoms are not resolved by completion of antibiotic.   Urinary Tract Infection A urinary tract infection (UTI) can occur any place along the urinary tract. The tract includes the kidneys, ureters, bladder, and urethra. A type of germ called bacteria often causes a UTI. UTIs are often helped with antibiotic medicine.  HOME CARE   If given, take antibiotics as told by your doctor. Finish them even if you start to feel better.  Drink enough fluids to keep your pee (urine) clear or pale yellow.  Avoid tea, drinks with caffeine, and bubbly (carbonated) drinks.  Pee often. Avoid holding your pee in for a long time.  Pee before and after having sex (intercourse).  Wipe from front to back after you poop (bowel movement) if you are a woman. Use each tissue only once. GET HELP RIGHT AWAY IF:   You have back pain.  You have lower belly (abdominal) pain.  You have chills.  You feel sick to your stomach (nauseous).  You throw up (vomit).  Your burning or discomfort with peeing does not go away.  You have a fever.  Your symptoms are not better in 3 days. MAKE SURE YOU:   Understand these instructions.  Will watch your condition.  Will get help right away if you are not doing well or get worse. Document Released: 11/29/2007 Document Revised: 03/06/2012 Document Reviewed: 01/11/2012 [facility] Patient Information 2015  [place], [place]. This information is not intended to replace advice given to you by your health care provider. Make sure you discuss any questions you have with your health care provider.

## 2015-09-07 NOTE — Telephone Encounter (Signed)
Patient Name: Kimberly Miles  DOB: 30-Sep-1955    Initial Comment caller states she has blood in urine   Nurse Assessment  Nurse: Leilani Merl, RN, Heather Date/Time (Eastern Time): 09/07/2015 10:04:11 AM  Confirm and document reason for call. If symptomatic, describe symptoms. You must click the next button to save text entered. ---Caller states that she woke up with urinary urgency and pressure yesterday and woke up with blood in her urine.  Has the patient traveled out of the country within the last 30 days? ---Not Applicable  Does the patient have any new or worsening symptoms? ---Yes  Will a triage be completed? ---Yes  Related visit to physician within the last 2 weeks? ---No  Does the PT have any chronic conditions? (i.e. diabetes, asthma, etc.) ---Yes  List chronic conditions. ---See MR  Is this a behavioral health or substance abuse call? ---No     Guidelines    Guideline Title Affirmed Question Affirmed Notes  Urine - Blood In Pain or burning with passing urine    Final Disposition User   See Physician within 24 Hours Stand[place], RN, Water quality scientist    Comments  Appt made with Elyn Aquas at 10:45 am today.   Referrals  REFERRED TO PCP OFFICE   Disagree/Comply: Comply

## 2015-09-07 NOTE — Progress Notes (Signed)
Pre visit review using our clinic review tool, if applicable. No additional management support is needed unless otherwise documented below in the visit note. 

## 2015-09-09 LAB — CULTURE, URINE COMPREHENSIVE

## 2015-09-22 ENCOUNTER — Ambulatory Visit (HOSPITAL_BASED_OUTPATIENT_CLINIC_OR_DEPARTMENT_OTHER)
Admission: RE | Admit: 2015-09-22 | Discharge: 2015-09-22 | Disposition: A | Discharge status: Home / Self Care | Payer: BLUE CROSS/BLUE SHIELD | Source: Ambulatory Visit | Attending: Physical Medicine and Rehabilitation | Admitting: Physical Medicine and Rehabilitation

## 2015-09-22 ENCOUNTER — Other Ambulatory Visit (HOSPITAL_BASED_OUTPATIENT_CLINIC_OR_DEPARTMENT_OTHER): Payer: Self-pay | Admitting: Physical Medicine and Rehabilitation

## 2015-09-22 DIAGNOSIS — R1032 Left lower quadrant pain: Secondary | ICD-10-CM

## 2015-09-22 DIAGNOSIS — R1030 Lower abdominal pain, unspecified: Secondary | ICD-10-CM | POA: Insufficient documentation

## 2015-10-18 ENCOUNTER — Telehealth: Payer: Self-pay | Admitting: Certified Nurse Midwife

## 2015-10-18 NOTE — Telephone Encounter (Signed)
I agree with the plan for the visit and EMB.

## 2015-10-18 NOTE — Telephone Encounter (Signed)
Spoke with patient. Patient states that she began to have light spotting on 10/14/2015. Reports she is still having light spotting today. Denies any pain. Reports on 10/14/2015 she did have mild cramping, but that this has gone away. States that she feels very bloated and feels she is getting full faster when eating. Reports having a BM temporarily relieves the feeling of fullness. Reports she is having regular BM. LMP was 5 years ago. Patient is out of town until 10/29/2015. Requesting an appointment for 11/01/2015. Appointment scheduled for 11/01/2015 at 10 am with Dr.Miller. She is agreeable to date and time. Advised she will need to monitor her bleeding. If bleeding increases or she develops and new symptoms she will need to be seen earlier for evaluation. She is agreeable and verbalizes understanding. Discussed the possible need for an EMB. All questions answered. Order for possible EMB placed.  Routing to [place] for review as Dr.Miller is out of the office this week.  Cc: Dr.Miller

## 2015-10-18 NOTE — Telephone Encounter (Signed)
Patient has started spotting after not having a period for 5 years. Patient is out of town and cannot come to an appointment today. Patient would like an appointment when she returns home.

## 2015-11-01 ENCOUNTER — Ambulatory Visit (INDEPENDENT_AMBULATORY_CARE_PROVIDER_SITE_OTHER): Payer: BLUE CROSS/BLUE SHIELD | Admitting: Obstetrics & Gynecology

## 2015-11-01 VITALS — BP 107/60 | HR 82 | Resp 13 | Ht 58.25 in | Wt 114.0 lb

## 2015-11-01 DIAGNOSIS — N950 Postmenopausal bleeding: Secondary | ICD-10-CM | POA: Diagnosis not present

## 2015-11-01 DIAGNOSIS — R252 Cramp and spasm: Secondary | ICD-10-CM | POA: Diagnosis not present

## 2015-11-01 LAB — COMPREHENSIVE METABOLIC PANEL
ALT: 59 U/L — ABNORMAL HIGH (ref 6–29)
AST: 49 U/L — AB (ref 10–35)
Albumin: 4.5 g/dL (ref 3.6–5.1)
Alkaline Phosphatase: 74 U/L (ref 33–130)
BUN: 10 mg/dL (ref 7–25)
CHLORIDE: 102 mmol/L (ref 98–110)
CO2: 29 mmol/L (ref 20–31)
CREATININE: 0.67 mg/dL (ref 0.50–0.99)
Calcium: 9.5 mg/dL (ref 8.6–10.4)
Glucose, Bld: 95 mg/dL (ref 65–99)
Potassium: 4.9 mmol/L (ref 3.5–5.3)
SODIUM: 139 mmol/L (ref 135–146)
TOTAL PROTEIN: 6.5 g/dL (ref 6.1–8.1)
Total Bilirubin: 0.4 mg/dL (ref 0.2–1.2)

## 2015-11-01 LAB — MAGNESIUM: MAGNESIUM: 2.3 mg/dL (ref 1.5–2.5)

## 2015-11-01 NOTE — Progress Notes (Signed)
GYNECOLOGY  VISIT   HPI: 60 y.o. G2P2 Married Caucasian female with complaint of postmenopausal vaginal bleeding.  This started 10/14/15 and was light spotting.  She wore a minipad or tampons.  She has noted just a little pinkish or brownish discharge since then.  She has no cramping or pelvic pain.  She's not had breast tenderness.  Denies any issues with acne or feeling "hormonal".  She has had a "pulling sensation" in her left breast but this has mostly resolved.  Feels like her hot flashes has significantly improved.  She does report migraines that were very menstrual cycle related.  She continues to get this about twice a month.  This hasn't really changed since her cycles stopped.  She did have regular menstrual cycles up to age 52.    Last pap 07/23/15.  Pt also has complaint/concern about calf cramping and spasms.  Would like some blood work done.  Has done a little research and has some specific questions about lab work.  GYNECOLOGIC HISTORY: Patient's last menstrual period was 08/25/2010.  Patient Active Problem List   Diagnosis Date Noted  . Urinary tract infectious disease 09/07/2015  . H/O partial thyroidectomy 01/14/2014  . Family history of premature CAD 09/01/2013  . Palpitations 02/21/2013  . History of migraine headaches 02/21/2013  . Hyperlipidemia 02/21/2013  . Thyroid function study abnormality 02/21/2013    Past Medical History  Diagnosis Date  . Migraines 1998  . IBS (irritable bowel syndrome)   . Diverticulosis   . Colitis 1980  . Palpitations   . Family history of premature CAD 09/01/2013  . Elevated LFTs   . Vitamin B 12 deficiency   . Arthritis   . Basal cell carcinoma   . Thyroid cyst   . Hypothyroidism   . GERD (gastroesophageal reflux disease)     Past Surgical History  Procedure Laterality Date  . Breast biopsy Left 1979    benign  . Colposcopy  05/2009    CIN 1  . Cesarean section  1977, 1978    x 2  . Oophorectomy Right 1985    ruptured  cyst   . Appendectomy    . Thyroidectomy, partial    . Myocardial perfusion study  04/07/2003    Normal Cardiolite   . 2d echocardiogram  05/09/2011    EF >55%, normal-mild    MEDS:  Reviewed in EPIC and UTD  ALLERGIES: Codeine; Erythromycin; Sulfa antibiotics; and Ampicillin  Family History  Problem Relation Age of Onset  . Lung cancer Mother 39  . Hypertension Mother 77  . Hyperlipidemia Mother 42  . Ehlers-Danlos syndrome Mother   . Kidney disease Mother   . Hypertension Maternal Grandmother   . Uterine cancer Maternal Grandmother   . Vascular Disease Maternal Grandmother   . Stroke Maternal Grandfather   . Prostate cancer Paternal Grandfather   . Other Father     bypass surgery times 2    SH:  Married, non smoker  Review of Systems  All other systems reviewed and are negative.   PHYSICAL EXAMINATION:    BP 107/60 mmHg  Pulse 82  Resp 13  Ht 4' 10.25" (1.48 m)  Wt 114 lb (51.71 kg)  BMI 23.61 kg/m2  LMP 08/25/2010     General appearance: alert, cooperative and appears stated age Abdomen: soft, non-tender; bowel sounds normal; no masses,  no organomegaly  Pelvic: External genitalia:  no lesions  Urethra:  normal appearing urethra with no masses, tenderness or lesions              Bartholins and Skenes: normal                 Vagina: normal appearing vagina with normal color and discharge, no lesions              Cervix: no lesions              Bimanual Exam:  Uterus:  normal size, contour, position, consistency, mobility, non-tender              Adnexa: no mass, fullness, tenderness              Anus:  normal sphincter tone, no lesions  Endometrial biopsy recommended.  Discussed with patient.  Verbal and written consent obtained.   Procedure:  Speculum placed.  Cervix visualized and cleansed with betadine prep.  A single toothed tenaculum was applied to the anterior lip of the cervix.  Endometrial pipelle could not initially be passed through  the cervical os.  Dilation with metal dilators was necessary before attempting to pass a catheter a second time.  This did pass but with significant pt discomfort to 6.5cm.  Suction applied and pipelle removed with good tissue sample obtained.  Tenculum removed.  No bleeding noted.  Patient tolerated procedure well.  Chaperone was present for exam.  Assessment: PMP bleeding Muscles cramping  Plan: Endometrial biopsy pending.  If sample is not adequate, PUS will be performed.   CMP and magnesium level obtained today.

## 2015-11-02 ENCOUNTER — Telehealth: Payer: Self-pay | Admitting: Nurse Practitioner

## 2015-11-02 NOTE — Telephone Encounter (Signed)
Opened in error

## 2015-11-02 NOTE — Telephone Encounter (Deleted)
Please let pt know that BMD done 10/27/15 shows a T Score: spine -2.60; right hip neck at -1.80; left hip neck at -2.20.  Comparison to previous exam 10/29/2012 shows a loss at all sites.  The lowest marker is at the spine which is in the Osteoporotic range.  FRAX scores are not measured.  Her last Vit D was good at 60 07/21/15.  She is at risk for further loss secondary to BMI and being hypothyroid, right oophorectomy and colitis especially if she was treated with steroids.  She has seen Dr. Sabra Heck in the past.  I would advise a consult visit and discuss results and treatment options.   Serum calcium is normal but may need PTH studies.

## 2015-11-04 ENCOUNTER — Telehealth: Payer: Self-pay | Admitting: Obstetrics & Gynecology

## 2015-11-04 NOTE — Telephone Encounter (Signed)
Patient calling to schedule an ultrasound. °

## 2015-11-05 NOTE — Telephone Encounter (Signed)
Left message to call [place] at [phone].  Notes Recorded by Megan Salon, MD on 11/05/2015 at 2:38 PM Please let pt know her magnesium level was normal. CMP normal except two liver enzymes were mildly elevated. No ETOH and no tylenol products. Will repeat liver studies when she comes for PUS.  Endometrial biopsy negative for abnormal cells.

## 2015-11-05 NOTE — Telephone Encounter (Signed)
Returned patients call, patient requested to schedule ultrasound, even though I was unable to verify benefits with her insurance company (due the office being temporarily closed). I will try insurance on Monday 11/08/15 and convey benefits. Patient is agreeable to this plan.  Patient also asked if her Bone Density test results were available and requested results of endometrial biopsy. Routing to clinical/triage to review

## 2015-11-05 NOTE — Telephone Encounter (Addendum)
Routing to [place] for review and advise of lab and pathology results from 11/01/2015. Pathology report to Dr.Miller's desk for review as well. BMD performed via [facility] [place] will look to see if this is available for review.  Call to [facility] BMD results to be faxed to the office.

## 2015-11-08 NOTE — Telephone Encounter (Signed)
Spoke with patient. Advised of results and message as seen below from [place]. She verbalizes understanding. Reports that her liver enzymes have been elevated for "years." States that she has had additional testing with her GI Dr.Perry and there was no reason found for the elevation. Reports she does not drink or take tylenol. Advised I will update Dr.Miller regarding liver enzymes. She is agreeable. Aware BMD has been received and report has been given to [place] as she was the ordering physician for review and advise. Will return with these results. She is agreeable.  Cc: Melvia Heaps CNM   Routing to provider for final review. Patient agreeable to disposition. Will close encounter.

## 2015-11-09 ENCOUNTER — Other Ambulatory Visit: Payer: Self-pay

## 2015-11-09 DIAGNOSIS — N950 Postmenopausal bleeding: Secondary | ICD-10-CM

## 2015-11-10 ENCOUNTER — Telehealth: Payer: Self-pay

## 2015-11-10 NOTE — Telephone Encounter (Signed)
Pt notified of bmd results. Pt has u/s appt with dr Sabra Heck tomorrow. Results to get scanned in after appt.

## 2015-11-11 ENCOUNTER — Ambulatory Visit (INDEPENDENT_AMBULATORY_CARE_PROVIDER_SITE_OTHER): Payer: BLUE CROSS/BLUE SHIELD | Admitting: Obstetrics & Gynecology

## 2015-11-11 ENCOUNTER — Ambulatory Visit (INDEPENDENT_AMBULATORY_CARE_PROVIDER_SITE_OTHER): Payer: BLUE CROSS/BLUE SHIELD

## 2015-11-11 VITALS — BP 130/82 | HR 70 | Resp 14 | Wt 113.0 lb

## 2015-11-11 DIAGNOSIS — M810 Age-related osteoporosis without current pathological fracture: Secondary | ICD-10-CM | POA: Diagnosis not present

## 2015-11-11 DIAGNOSIS — N950 Postmenopausal bleeding: Secondary | ICD-10-CM

## 2015-11-11 DIAGNOSIS — E890 Postprocedural hypothyroidism: Secondary | ICD-10-CM

## 2015-11-11 DIAGNOSIS — Z9009 Acquired absence of other part of head and neck: Secondary | ICD-10-CM

## 2015-11-11 LAB — THYROID PANEL WITH TSH
Free Thyroxine Index: 2.3 (ref 1.4–3.8)
T3 Uptake: 27 % (ref 22–35)
T4, Total: 8.6 ug/dL (ref 4.5–12.0)
TSH: 1.3 m[IU]/L

## 2015-11-11 NOTE — Progress Notes (Signed)
60 y.o. G2P2 Married Caucasian female here for pelvic ultrasound due to PMP bleeding.  Pt had endometrial biopsy obtained on 11/01/15 that was negative for abnormal cells.  She also had lab work obtained showing mildly elevated liver enzymes.  This is discussed with pt.  She has long hx of this and had complete GI evaluation with Dr. Sharlett Iles (who is retired).  She now sees Dr. Henrene Pastor who agrees with the evaluation that was done.  She is very careful about any anti-inflammatory medication that she takes as well as not consuming any alcohol.    Pt had recent BMD obtained and would like to discuss this today.  Comparison made to prior BMD.  She's not interested in any medication at this time, if possible.  She recently had a CMP and Vit D.  Other than the elevated liver enzymes, the CMP was normal.  Vit D was 60.  Needs TSH (pt desires panel with it) and PTH.  Supplemental calcium and weight bearing exercise 30 minutes for 3 times a week was reviewed.  Pt admits she is not doing this but will start.  Patient's last menstrual period was 08/25/2010.  Contraception: PMP  Findings:  UTERUS: 5.3 x 4 x 2.5cm EMS:1.39mm ADNEXA: Left ovary: 1.8 x 0.9 x 0.5cm.  Imaging difficult due to overlying bowel gas       Right ovary: surgically absent CUL DE SAC: no free fluid  Discussion:  With negative endometrial biopsy and ultrasound today, do not feel additional evaluation is warranted.  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  Obtaining the last endometrial biopsy was difficult and quite painful for pt.  She is in agreement with this.  Will plan to obtained PTH today as well as thyroid testing.  If this is normal, plan to repeat BMD two years and pt will add 500-800mg  calcium daily as well as try to have 30 minutes of weight bearing exercise 3 times weekly, minimum.  Assessment:  PMP bleeding with negative endometrial biopsy and normal appearing  ultrasound Early osteoporosis, declines medication at this time Mildly elevated liver enzymes, which has been evaluated by GI and is a long-term issue for her H/O partial thyroidectomy  H/O RSO due to ruptured cyst per her hx  Plan:  Pt knows to call with continued/future bleeding.  Will proceed with hysteroscopy if this occurs. Add calcium supplementation PTH and TSH with panel today Repeat BMD in 2 years  ~30 minutes spent with patient >50% of time was in face to face discussion of above.

## 2015-11-12 LAB — PTH, INTACT AND CALCIUM
CALCIUM: 9.5 mg/dL (ref 8.4–10.5)
PTH: 30 pg/mL (ref 14–64)

## 2015-11-15 ENCOUNTER — Encounter: Payer: Self-pay | Admitting: Obstetrics & Gynecology

## 2015-11-18 ENCOUNTER — Encounter: Payer: Self-pay | Admitting: Obstetrics & Gynecology

## 2015-11-24 ENCOUNTER — Encounter: Payer: Self-pay | Admitting: Certified Nurse Midwife

## 2015-11-26 ENCOUNTER — Other Ambulatory Visit: Payer: Self-pay | Admitting: Family Medicine

## 2015-11-26 ENCOUNTER — Telehealth: Payer: Self-pay | Admitting: Obstetrics & Gynecology

## 2015-11-26 NOTE — Telephone Encounter (Signed)
Patient is still spotting after coming in to see Dr.Miller.

## 2015-11-26 NOTE — Telephone Encounter (Signed)
Yes, hysteroscopy is appropriate at this time.  Will copy this to sally for scheduling. Please let pt know we will proceed with getting this set up.  Thanks.

## 2015-11-26 NOTE — Telephone Encounter (Signed)
Spoke with patient. Patient states that she placed a tampon as directed by Dr.Miller and is still experiencing pink with pale gray/brown colored bleeding when she removes the tampon. Patient was last seen in the office with Dr.Miller for evaluation of PMB on 11/11/2015 for a PUS. PUS was normal. EMB on 11/01/2015 was negative and normal. Patient states that she would like to move forward with a hysteroscopy if needed at this time. Advised I will send a message to [place] and nursing manager/surgery scheduler regarding moving forward.  Cc: Lamont Snowball, RN

## 2015-11-26 NOTE — Telephone Encounter (Signed)
Labs done on 11/11/15 at GYN office. Please advise     KP

## 2015-12-02 NOTE — Telephone Encounter (Signed)
Called patient to review benefits for a recommended surgical procedure. Left Voicemail requesting a call back. °

## 2015-12-02 NOTE — Telephone Encounter (Signed)
Spoke with pt regarding benefit for surgery. Patient understood and agreeable. Patient ready to schedule. Patient provided surgery deposit over the phone. Patient aware this is professional benefit only. Patient aware will be contacted by hospital for separate benefits. Staff message to Sally for scheduling °

## 2015-12-07 ENCOUNTER — Telehealth: Payer: Self-pay | Admitting: *Deleted

## 2015-12-07 NOTE — Telephone Encounter (Signed)
Call to patient to discuss surgery scheduling options. Patient desires to proceed with first available date. Surgery scheduled for 12-23-15 at [facility] at 0730. Consult with Dr Sabra Heck scheduled for 12-14-15 to discuss. Surgery instruction sheet reviewed and printed copy mailed to patient with brochure from surgery center. Patient agreeable to plan.  Routing to provider for final review. Patient agreeable to disposition. Will close encounter.

## 2015-12-08 ENCOUNTER — Other Ambulatory Visit: Payer: Self-pay | Admitting: Obstetrics & Gynecology

## 2015-12-10 ENCOUNTER — Telehealth: Payer: Self-pay | Admitting: Obstetrics & Gynecology

## 2015-12-10 NOTE — Telephone Encounter (Signed)
Patient returned call

## 2015-12-10 NOTE — Telephone Encounter (Signed)
Call back to patient. Left message to call back. Can reschedule surgery consult up until day before surgery. Need to know if patient desires to reschedule surgery or proceed as planned. Left message to call back when able.

## 2015-12-10 NOTE — Telephone Encounter (Signed)
Patient has had a death in her family and will need to reschedule her surgery consult. She will have to go to [place] and not sure how soon she will need to come in before surgery.

## 2015-12-10 NOTE — Telephone Encounter (Signed)
Patient returned call. Sudden death of "husband's son" and she is not yet sure when services will be. She will be traveling to [place]. Surgery consult rescheduled to 12-21-15. Patient wants to plan to proceed with procedure as scheduled for now and will call me back if anything changes. Given phone number to [facility] so she may contact them to review pre-op information before she leaves town. Support given.  Routing to provider for final review. Patient agreeable to disposition. Will close encounter.

## 2015-12-16 ENCOUNTER — Encounter (HOSPITAL_BASED_OUTPATIENT_CLINIC_OR_DEPARTMENT_OTHER): Payer: Self-pay | Admitting: *Deleted

## 2015-12-16 ENCOUNTER — Ambulatory Visit: Payer: BLUE CROSS/BLUE SHIELD | Admitting: Obstetrics & Gynecology

## 2015-12-16 NOTE — Progress Notes (Addendum)
NPO AFTER MN.  ARRIVE AT 0600.  NEEDS HG.  WILL TAKE SYNTHROID AM DOS W/ SIPS OF WATER.  LAST CMET DONE 11-01-2015.

## 2015-12-21 ENCOUNTER — Ambulatory Visit (INDEPENDENT_AMBULATORY_CARE_PROVIDER_SITE_OTHER): Payer: BLUE CROSS/BLUE SHIELD | Admitting: Obstetrics & Gynecology

## 2015-12-21 ENCOUNTER — Encounter: Payer: Self-pay | Admitting: Obstetrics & Gynecology

## 2015-12-21 VITALS — BP 108/72 | HR 70 | Resp 16 | Ht 58.5 in | Wt 113.6 lb

## 2015-12-21 DIAGNOSIS — N950 Postmenopausal bleeding: Secondary | ICD-10-CM

## 2015-12-21 MED ORDER — HYDROCODONE-ACETAMINOPHEN 5-325 MG PO TABS: 1.0000 | ORAL_TABLET | Freq: Four times a day (QID) | ORAL | Status: DC | PRN

## 2015-12-21 NOTE — Progress Notes (Signed)
60 y.o. G2P2 Married Caucasian female here for discussion on persistent PMP bleeding.  Pt did have an office biopsy that was negative for abnormal cells.  Ultrasound was performed 11/11/15.  Left ovary was very small.  Endometrium was thin.  She has continued to have spotting that is dark or pink.  She has decided she would like to proceed with hysteroscopy.  Procedure discussed with patient.  Recovery and pain management discussed.  Risks discussed including but not limited to bleeding, rare risk of transfusion, infection, 1% risk of uterine perforation with risks of fluid deficit causing cardiac arrythmia, cerebral swelling and/or need to stop procedure early.  Fluid emboli and rare risk of death discussed.  DVT/PE, rare risk of risk of bowel/bladder/ureteral/vascular injury.  Patient aware if pathology abnormal she may need additional treatment.  All questions answered.    Ob Hx:   Patient's last menstrual period was 08/25/2010.          Sexually active: Yes.   Birth control: no method Last pap: 07/21/2015 negative  Last MMG: 11/03/2015 BIRADS 1 negative  Tobacco: Never smoker  Past Surgical History  Procedure Laterality Date  . Colposcopy  05/2009    CIN 1  . Cesarean section  1977 and 1978    Bilateral Tubal Ligation w/ last c/s  . Oophorectomy Right 1985    ruptured cyst   . Appendectomy  as child  . Breast biopsy Left 1979    benign  . Thyroid lobectomy Right 03-08-2011  . Transthoracic echocardiogram  05-09-2011    ef 64%/  mild MR and TR  . Cardiovascular stress test  09-16-2013    normal nuclear study w/ no ischemia/  normal LV function and wall motion , ef 84%  . Negative sleep study  2014  per pt    Past Medical History  Diagnosis Date  . IBS (irritable bowel syndrome)   . Diverticulosis   . Family history of premature CAD 09/01/2013  . Elevated LFTs     monitored by pcp per pt --  unknown etiology  . Arthritis   . History of palpitations   . 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 History of colitis   . PMB (postmenopausal bleeding)   . History of basal cell carcinoma excision     2004   &  2014  . Post-surgical hypothyroidism   . Migraine     Allergies: Codeine; Demerol; Erythromycin; Sulfa antibiotics; and Ampicillin  Current Outpatient Prescriptions  Medication Sig Dispense Refill  . Ascorbic Acid (VITAMIN C PO) Take by mouth daily.    Marland Kitchen b complex vitamins tablet Take 1 tablet by mouth daily.    . baclofen (LIORESAL) 10 MG tablet Take 1 tablet by mouth 2 (two) times daily as needed.  0  . Cholecalciferol (VITAMIN D3) 5000 UNITS CAPS Take 1 capsule by mouth daily.    . cyclobenzaprine (FLEXERIL) 5 MG tablet Take 1 tablet by mouth daily as needed.     . Homeopathic Products (LIVER SUPPORT SL) Take by mouth daily.    Marland Kitchen levothyroxine (SYNTHROID, LEVOTHROID) 75 MCG tablet TAKE 1 TABLET BY MOUTH EVERY MORNING BEFORE BREAKFAST 90 tablet 3  . MAGNESIUM GLYCINATE PLUS PO Take 200 mg by mouth daily.     . Melatonin 3 MG TABS Take by mouth at bedtime.    . traMADol (ULTRAM) 50 MG tablet Take 50 mg  by mouth 3 (three) times daily as needed.  0  . zolpidem (AMBIEN) 5 MG tablet Take 1 tablet (5 mg total) by mouth at bedtime as needed for sleep. 30 tablet 0   No current facility-administered medications for this visit.    ROS: Pertinent items noted in HPI and remainder of comprehensive ROS otherwise negative.  Exam:    LMP 08/25/2010  General appearance: alert and cooperative Head: Normocephalic, without obvious abnormality, atraumatic Neck: no adenopathy, supple, symmetrical, trachea midline and thyroid not enlarged, symmetric, no tenderness/mass/nodules Lungs: clear to auscultation bilaterally Heart: regular rate and rhythm, S1, S2 normal, no murmur, click, rub or gallop Abdomen: soft, non-tender; bowel sounds normal; no masses,  no organomegaly Extremities: extremities  normal, atraumatic, no cyanosis or edema Skin: Skin color, texture, turgor normal. No rashes or lesions Lymph nodes: Cervical, supraclavicular, and axillary nodes normal. no inguinal nodes palpated Neurologic: Grossly normal  Pelvic: no pelvic exam performed today  A: Persistent PMP bleeding H/O RSO 1985 due to ruptured cyst  P:  Hysteroscopy with possible polyp resection planned Rx for Motrin and Vicodin given. Post op instructions given

## 2015-12-22 NOTE — Anesthesia Preprocedure Evaluation (Addendum)
Anesthesia Evaluation  Patient identified by MRN, date of birth, ID band Patient awake    Reviewed: Allergy & Precautions, NPO status , Patient's Chart, lab work & pertinent test results  Airway Mallampati: II  TM Distance: >3 FB Neck ROM: Full    Dental  (+) Dental Advisory Given   Pulmonary neg pulmonary ROS,    breath sounds clear to auscultation       Cardiovascular negative cardio ROS   Rhythm:Regular Rate:Normal  08/2013 Normal nuclear stress test with normal LV function and no ischemia.   Neuro/Psych  Headaches,    GI/Hepatic negative GI ROS, Neg liver ROS,   Endo/Other  Hypothyroidism   Renal/GU negative Renal ROS     Musculoskeletal  (+) Arthritis ,   Abdominal   Peds  Hematology negative hematology ROS (+)   Anesthesia Other Findings   Reproductive/Obstetrics                            . Anesthesia Physical Anesthesia Plan  ASA: II  Anesthesia Plan: General   Post-op Pain Management:    Induction: Intravenous  Airway Management Planned: LMA  Additional Equipment:   Intra-op Plan:   Post-operative Plan: Extubation in OR  Informed Consent: I have reviewed the patients History and Physical, chart, labs and discussed the procedure including the risks, benefits and alternatives for the proposed anesthesia with the patient or authorized representative who has indicated his/her understanding and acceptance.     Plan Discussed with: CRNA  Anesthesia Plan Comments:        Anesthesia Quick Evaluation

## 2015-12-23 ENCOUNTER — Ambulatory Visit (HOSPITAL_BASED_OUTPATIENT_CLINIC_OR_DEPARTMENT_OTHER): Payer: BLUE CROSS/BLUE SHIELD | Admitting: Anesthesiology

## 2015-12-23 ENCOUNTER — Encounter (HOSPITAL_BASED_OUTPATIENT_CLINIC_OR_DEPARTMENT_OTHER)
Admission: RE | Disposition: A | Discharge status: Home / Self Care | Payer: Self-pay | Source: Ambulatory Visit | Attending: Obstetrics & Gynecology

## 2015-12-23 ENCOUNTER — Encounter (HOSPITAL_BASED_OUTPATIENT_CLINIC_OR_DEPARTMENT_OTHER): Payer: Self-pay | Admitting: *Deleted

## 2015-12-23 ENCOUNTER — Ambulatory Visit (HOSPITAL_BASED_OUTPATIENT_CLINIC_OR_DEPARTMENT_OTHER)
Admission: RE | Admit: 2015-12-23 | Discharge: 2015-12-23 | Disposition: A | Discharge status: Home / Self Care | Payer: BLUE CROSS/BLUE SHIELD | Source: Ambulatory Visit | Attending: Obstetrics & Gynecology | Admitting: Obstetrics & Gynecology

## 2015-12-23 DIAGNOSIS — M1990 Unspecified osteoarthritis, unspecified site: Secondary | ICD-10-CM | POA: Insufficient documentation

## 2015-12-23 DIAGNOSIS — Z79899 Other long term (current) drug therapy: Secondary | ICD-10-CM | POA: Diagnosis not present

## 2015-12-23 DIAGNOSIS — N950 Postmenopausal bleeding: Secondary | ICD-10-CM | POA: Diagnosis not present

## 2015-12-23 DIAGNOSIS — Z79891 Long term (current) use of opiate analgesic: Secondary | ICD-10-CM | POA: Insufficient documentation

## 2015-12-23 DIAGNOSIS — E039 Hypothyroidism, unspecified: Secondary | ICD-10-CM | POA: Diagnosis not present

## 2015-12-23 HISTORY — DX: Postprocedural hypothyroidism: E89.0

## 2015-12-23 HISTORY — DX: Postmenopausal bleeding: N95.0

## 2015-12-23 HISTORY — DX: Personal history of other diseases of the digestive system: Z87.19

## 2015-12-23 HISTORY — DX: Personal history of other specified conditions: Z87.898

## 2015-12-23 HISTORY — DX: Migraine, unspecified, not intractable, without status migrainosus: G43.909

## 2015-12-23 HISTORY — PX: HYSTEROSCOPY WITH D & C: SHX1775

## 2015-12-23 HISTORY — DX: Personal history of other endocrine, nutritional and metabolic disease: Z86.39

## 2015-12-23 HISTORY — DX: Other specified postprocedural states: Z98.890

## 2015-12-23 HISTORY — DX: Personal history of other malignant neoplasm of skin: Z85.828

## 2015-12-23 LAB — POCT HEMOGLOBIN-HEMACUE: HEMOGLOBIN: 14.1 g/dL (ref 12.0–15.0)

## 2015-12-23 SURGERY — DILATATION AND CURETTAGE /HYSTEROSCOPY
Anesthesia: General

## 2015-12-23 MED ORDER — FENTANYL CITRATE (PF) 100 MCG/2ML IJ SOLN
INTRAMUSCULAR | Status: AC
  Filled 2015-12-23: qty 2

## 2015-12-23 MED ORDER — KETOROLAC TROMETHAMINE 30 MG/ML IJ SOLN
INTRAMUSCULAR | Status: DC | PRN
  Administered 2015-12-23: 30 mg via INTRAVENOUS

## 2015-12-23 MED ORDER — FENTANYL CITRATE (PF) 100 MCG/2ML IJ SOLN
INTRAMUSCULAR | Status: DC | PRN
Start: 2015-12-23 — End: 2015-12-23
  Administered 2015-12-23: 50 ug via INTRAVENOUS

## 2015-12-23 MED ORDER — ONDANSETRON HCL 4 MG/2ML IJ SOLN
INTRAMUSCULAR | Status: AC
  Filled 2015-12-23: qty 2

## 2015-12-23 MED ORDER — LACTATED RINGERS IV SOLN
INTRAVENOUS | Status: DC
  Administered 2015-12-23 (×2): via INTRAVENOUS
  Filled 2015-12-23: qty 1000

## 2015-12-23 MED ORDER — ARTIFICIAL TEARS OP OINT
TOPICAL_OINTMENT | OPHTHALMIC | Status: AC
  Filled 2015-12-23: qty 3.5

## 2015-12-23 MED ORDER — PROPOFOL 10 MG/ML IV BOLUS
INTRAVENOUS | Status: DC | PRN
  Administered 2015-12-23: 150 mg via INTRAVENOUS

## 2015-12-23 MED ORDER — SODIUM CHLORIDE 0.9 % IV SOLN
INTRAVENOUS | Status: DC | PRN
  Administered 2015-12-23: 1000 mL

## 2015-12-23 MED ORDER — PROPOFOL 10 MG/ML IV BOLUS
INTRAVENOUS | Status: AC
  Filled 2015-12-23: qty 40

## 2015-12-23 MED ORDER — KETOROLAC TROMETHAMINE 30 MG/ML IJ SOLN
INTRAMUSCULAR | Status: AC
  Filled 2015-12-23: qty 1

## 2015-12-23 MED ORDER — DEXAMETHASONE SODIUM PHOSPHATE 4 MG/ML IJ SOLN
INTRAMUSCULAR | Status: DC | PRN
  Administered 2015-12-23: 10 mg via INTRAVENOUS

## 2015-12-23 MED ORDER — ONDANSETRON HCL 4 MG/2ML IJ SOLN
INTRAMUSCULAR | Status: DC | PRN
  Administered 2015-12-23: 4 mg via INTRAVENOUS

## 2015-12-23 MED ORDER — DEXAMETHASONE SODIUM PHOSPHATE 10 MG/ML IJ SOLN
INTRAMUSCULAR | Status: AC
  Filled 2015-12-23: qty 1

## 2015-12-23 MED ORDER — LIDOCAINE HCL (CARDIAC) 20 MG/ML IV SOLN
INTRAVENOUS | Status: AC
  Filled 2015-12-23: qty 5

## 2015-12-23 MED ORDER — LIDOCAINE HCL (CARDIAC) 20 MG/ML IV SOLN
INTRAVENOUS | Status: DC | PRN
  Administered 2015-12-23: 60 mg via INTRAVENOUS

## 2015-12-23 MED ORDER — LIDOCAINE-EPINEPHRINE (PF) 1 %-1:200000 IJ SOLN
INTRAMUSCULAR | Status: DC | PRN
  Administered 2015-12-23: 10 mL

## 2015-12-23 SURGICAL SUPPLY — 29 items
CANISTER SUCTION 2500CC (MISCELLANEOUS) ×2 IMPLANT
CATH ROBINSON RED A/P 16FR (CATHETERS) ×2 IMPLANT
COVER BACK TABLE 60X90IN (DRAPES) ×2 IMPLANT
DEVICE MYOSURE LITE (MISCELLANEOUS) IMPLANT
DEVICE MYOSURE REACH (MISCELLANEOUS) IMPLANT
DRAPE HYSTEROSCOPY (DRAPE) ×2 IMPLANT
DRAPE LG THREE QUARTER DISP (DRAPES) ×2 IMPLANT
DRSG TELFA 3X8 NADH (GAUZE/BANDAGES/DRESSINGS) ×2 IMPLANT
ELECT REM PT RETURN 9FT ADLT (ELECTROSURGICAL) ×2
ELECTRODE REM PT RTRN 9FT ADLT (ELECTROSURGICAL) ×1 IMPLANT
FILTER ARTHROSCOPY CONVERTOR (FILTER) ×2 IMPLANT
GLOVE BIOGEL PI IND STRL 7.0 (GLOVE) ×1 IMPLANT
GLOVE BIOGEL PI INDICATOR 7.0 (GLOVE) ×1
GLOVE ECLIPSE 6.5 STRL STRAW (GLOVE) ×4 IMPLANT
GOWN STRL REUS W/TWL XL LVL3 (GOWN DISPOSABLE) ×2 IMPLANT
KIT ROOM TURNOVER WOR (KITS) ×2 IMPLANT
LEGGING LITHOTOMY PAIR STRL (DRAPES) ×2 IMPLANT
MANIFOLD NEPTUNE II (INSTRUMENTS) IMPLANT
PACK BASIN DAY SURGERY FS (CUSTOM PROCEDURE TRAY) ×2 IMPLANT
PAD DRESSING TELFA 3X8 NADH (GAUZE/BANDAGES/DRESSINGS) ×1 IMPLANT
PAD OB MATERNITY 4.3X12.25 (PERSONAL CARE ITEMS) ×2 IMPLANT
PAD PREP 24X48 CUFFED NSTRL (MISCELLANEOUS) ×2 IMPLANT
SEAL ROD LENS SCOPE MYOSURE (ABLATOR) ×2 IMPLANT
TOWEL OR 17X24 6PK STRL BLUE (TOWEL DISPOSABLE) ×4 IMPLANT
TRAY DSU PREP LF (CUSTOM PROCEDURE TRAY) ×2 IMPLANT
TUBE CONNECTING 12X1/4 (SUCTIONS) IMPLANT
TUBING AQUILEX INFLOW (TUBING) ×2 IMPLANT
TUBING AQUILEX OUTFLOW (TUBING) ×2 IMPLANT
WATER STERILE IRR 500ML POUR (IV SOLUTION) ×2 IMPLANT

## 2015-12-23 NOTE — Op Note (Signed)
12/23/2015  8:10 AM  PATIENT:  Laureen Ochs  60 y.o. female  PRE-OPERATIVE DIAGNOSIS:  PMB  POST-OPERATIVE DIAGNOSIS: same  PROCEDURE:  Procedure(s): DILATATION AND CURETTAGE /HYSTEROSCOPY   SURGEON:  Cataldo Cosgriff SUZANNE  ASSISTANTS: OR staff   ANESTHESIA:   general  ESTIMATED BLOOD LOSS: 10cc  BLOOD ADMINISTERED:none   FLUIDS: 600cc LR  UOP: 50cc   SPECIMEN:  Endometrial curetting  DISPOSITION OF SPECIMEN:  PATHOLOGY  FINDINGS: thin endometrium, no evidence of endometrial polyp  DESCRIPTION OF OPERATION: Patient was taken to the operating room.  She is placed in the supine position. SCDs were on her lower extremities and functioning properly. General anesthesia with an LMA was administered without difficulty.   Legs were then placed in the [place] in the low lithotomy position. The legs were lifted to the high lithotomy position and the Betadine prep was used on the inner thighs perineum and vagina x3. Patient was draped in a normal standard fashion. An in and out catheterization with a red rubber Foley catheter was performed. Approximately 50 cc of clear urine was noted. A bivalve speculum was placed the vagina. The anterior lip of the cervix was grasped with single-tooth tenaculum.  A paracervical block of 1% lidocaine mixed one-to-one with epinephrine (1:100,000 units).  10 cc was used total. Stenotic os was noted.  Milex dilator used to dilate the cervix initially.  This was without issues.  Then the cervix is dilated up to #21 [facility] dilators. The endometrial cavity sounded to 6.5cm.   A Myosure hysteroscope was obtained.  This could not be passed and I could not dilate the cervix to a #23 due to stenosis.  2.9 millimeter diagnostic hysteroscope was obtained. NS was used as a hysteroscopic fluid. The hysteroscope was advanced through the endocervical canal into the endometrial cavity. The tubal ostia were noted bilaterally. The endometrium was thin.  Resection was  not required.  Hysteroscope was removed.  Using a #1 toothed curette, the entire cavity was curetted until a rough gritty texture was noted in all quadrants.  At this point no other procedure was required and the procedure was ended.  The fluid deficit was 25cc. The tenaculum was removed from the anterior lip of the cervix. The speculum was returned vagina. The prep was cleansed of the patient's skin. The legs are positioned back in the supine position. Sponge, lap, needle, initially counts were correct x2. Patient was taken to recovery in stable condition.  COUNTS:  YES  PLAN OF CARE: Transfer to PACU

## 2015-12-23 NOTE — Anesthesia Procedure Notes (Signed)
Procedure Name: LMA Insertion Date/Time: 12/23/2015 7:37 AM Performed by: Mechele Claude Pre-anesthesia Checklist: Patient identified, Emergency Drugs available, Suction available and Patient being monitored Patient Re-evaluated:Patient Re-evaluated prior to inductionOxygen Delivery Method: Circle System Utilized Preoxygenation: Pre-oxygenation with 100% oxygen Intubation Type: IV induction Ventilation: Mask ventilation without difficulty LMA: LMA inserted LMA Size: 4.0 Number of attempts: 1 Airway Equipment and Method: bite block Placement Confirmation: positive ETCO2 Tube secured with: Tape Dental Injury: Teeth and Oropharynx as per pre-operative assessment

## 2015-12-23 NOTE — Anesthesia Postprocedure Evaluation (Signed)
Anesthesia Post Note  Patient: Kimberly Miles  Procedure(s) Performed: Procedure(s) (LRB): DILATATION AND CURETTAGE /HYSTEROSCOPY  (N/A)  Patient location during evaluation: PACU Anesthesia Type: General Level of consciousness: awake and alert Pain management: pain level controlled Vital Signs Assessment: post-procedure vital signs reviewed and stable Respiratory status: spontaneous breathing, nonlabored ventilation, respiratory function stable and patient connected to nasal cannula oxygen Cardiovascular status: blood pressure returned to baseline and stable Postop Assessment: no signs of nausea or vomiting Anesthetic complications: no    Last Vitals:  Filed Vitals:   12/23/15 0915 12/23/15 0955  BP:  123/67  Pulse: 59 60  Temp:  36.8 C  Resp: 15 14    Last Pain: There were no vitals filed for this visit.               [name]

## 2015-12-23 NOTE — Transfer of Care (Signed)
Last Vitals:  Filed Vitals:   12/23/15 0611 12/23/15 0820  BP: 103/68   Pulse: 72   Temp: 36.9 C 36.6 C  Resp: 14     Last Pain: There were no vitals filed for this visit.    Patients Stated Pain Goal: 8 (12/23/15 [id number]) Immediate Anesthesia Transfer of Care Note  Patient: Kimberly Miles  Procedure(s) Performed: Procedure(s) (LRB): DILATATION AND CURETTAGE /HYSTEROSCOPY  (N/A)  Patient Location: PACU  Anesthesia Type: General  Level of Consciousness: awake, alert  and oriented  Airway & Oxygen Therapy: Patient Spontanous Breathing and Patient connected to nasal cannula oxygen  Post-op Assessment: Report given to PACU RN and Post -op Vital signs reviewed and stable  Post vital signs: Reviewed and stable  Complications: No apparent anesthesia complications

## 2015-12-23 NOTE — Discharge Instructions (Addendum)
Post Anesthesia Home Care Instructions  Activity: Get plenty of rest for the remainder of the day. A responsible adult should stay with you for 24 hours following the procedure.  For the next 24 hours, DO NOT: -Drive a car -Paediatric nurse -Drink alcoholic beverages -Take any medication unless instructed by your physician -Make any legal decisions or sign important papers.  Meals: Start with liquid foods such as gelatin or soup. Progress to regular foods as tolerated. Avoid greasy, spicy, heavy foods. If nausea and/or vomiting occur, drink only clear liquids until the nausea and/or vomiting subsides. Call your physician if vomiting continues.  Special Instructions/Symptoms: Your throat may feel dry or sore from the anesthesia or the breathing tube placed in your throat during surgery. If this causes discomfort, gargle with warm salt water. The discomfort should disappear within 24 hours.  If you had a scopolamine patch placed behind your ear for the management of post- operative nausea and/or vomiting:  1. The medication in the patch is effective for 72 hours, after which it should be removed.  Wrap patch in a tissue and discard in the trash. Wash hands thoroughly with soap and water. 2. You may remove the patch earlier than 72 hours if you experience unpleasant side effects which may include dry mouth, dizziness or visual disturbances. 3. Avoid touching the patch. Wash your hands with soap and water after contact with the patch.     Post-surgical Instructions, Outpatient Surgery  You may expect to feel dizzy, weak, and drowsy for as long as 24 hours after receiving the medicine that made you sleep (anesthetic). For the first 24 hours after your surgery:    Do not drive a car, ride a bicycle, participate in physical activities, or take public transportation until you are done taking narcotic pain medicines or as directed by Dr. Sabra Heck.   Do not drink alcohol or take tranquilizers.     Do not take medicine that has not been prescribed by your physicians.   Do not sign important papers or make important decisions while on narcotic pain medicines.   Have a responsible person with you.   PAIN MANAGEMENT  Motrin 800mg .  (This is the same as 4-200mg  over the counter tablets of Motrin or ibuprofen.)  You may take this every eight hours or as needed for cramping.    Vicodin 5/325mg .  For more severe pain, take one or two tablets every four to six hours as needed for pain control.  (Remember that narcotic pain medications increase your risk of constipation.  If this becomes a problem, you may take an over the counter stool softener like Colace 100mg  up to four times a day.)  DO'S AND DON'T'S  Do not take a tub bath for one week.  You may shower on the first day after your surgery  Do not do any heavy lifting for one to two weeks.  This increases the chance of bleeding.  Do move around as you feel able.  Stairs are fine.  You may begin to exercise again as you feel able.  Do not lift any weights for two weeks.  Do not put anything in the vagina for two weeks--no tampons, intercourse, or douching.    REGULAR MEDIATIONS/VITAMINS:  You may restart all of your regular medications as prescribed.  You may restart all of your vitamins as you normally take them.    PLEASE CALL OR SEEK MEDICAL CARE IF:  You have persistent nausea and vomiting.   You  have trouble eating or drinking.   You have an oral temperature above 100.5.   You have constipation that is not helped by adjusting diet or increasing fluid intake. Pain medicines are a common cause of constipation.   You have heavy vaginal bleeding  You have redness or drainage from your incision(s) or there is increasing pain or tenderness near or in the surgical site.

## 2015-12-23 NOTE — H&P (Signed)
Kimberly Miles is an 60 y.o. female  G2P2 MWF with persistent PMP bleeding here for hysteroscopy with D&C.  Pt has undergone and office biopsy with thin endometrium and had a negative endometrial biopsy.  Because of the continued bleeding, decision made to proceed with this.  There is no alternative.  Risks and benefits have been discussed in the office settting.  Pt is here and ready to proceed.  Pertinent Gynecological History: Menses: post-menopausal Bleeding: post menopausal bleeding Contraception: post menopausal status DES exposure: denies Blood transfusions: none Sexually transmitted diseases: no past history Previous GYN Procedures: none  Last mammogram: normal Date: 5/17 Last pap: normal Date: 1/17 OB History: G2, P2   Menstrual History: Patient's last menstrual period was 08/25/2010.    Past Medical History  Diagnosis Date  . IBS (irritable bowel syndrome)   . Diverticulosis   . Family history of premature CAD 09/01/2013  . Elevated LFTs     monitored by pcp per pt --  unknown etiology  . Arthritis   . History of palpitations   . 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 History of colitis   . PMB (postmenopausal bleeding)   . History of basal cell carcinoma excision     2004   &  2014  . Post-surgical hypothyroidism   . Migraine     Past Surgical History  Procedure Laterality Date  . Colposcopy  05/2009    CIN 1  . Cesarean section  1977 and 1978    Bilateral Tubal Ligation w/ last c/s  . Oophorectomy Right 1985    ruptured cyst   . Appendectomy  as child  . Breast biopsy Left 1979    benign  . Thyroid lobectomy Right 03-08-2011  . Transthoracic echocardiogram  05-09-2011    ef 64%/  mild MR and TR  . Cardiovascular stress test  09-16-2013    normal nuclear study w/ no ischemia/  normal LV function and wall motion , ef 84%  . Negative sleep study  2014  per pt    Family  History  Problem Relation Age of Onset  . Lung cancer Mother 62  . Hypertension Mother 24  . Hyperlipidemia Mother 66  . Ehlers-Danlos syndrome Mother   . Kidney disease Mother   . Hypertension Maternal Grandmother   . Uterine cancer Maternal Grandmother   . Vascular Disease Maternal Grandmother   . Stroke Maternal Grandfather   . Prostate cancer Paternal Grandfather   . Other Father     bypass surgery times 2    Social History:  reports that she has never smoked. She has never used smokeless tobacco. She reports that she drinks alcohol. She reports that she does not use illicit drugs.  Allergies:  Allergies  Allergen Reactions  . Codeine Nausea Only  . Demerol [Meperidine] Nausea And Vomiting  . Erythromycin Other (See Comments)    Stomach upset  . Sulfa Antibiotics Other (See Comments)    unknown reaction  . Ampicillin Rash    Prescriptions prior to admission  Medication Sig Dispense Refill Last Dose  . Ascorbic Acid (VITAMIN C PO) Take by mouth daily.   Past Week at Unknown time  . b complex vitamins tablet Take 1 tablet by mouth daily. Reported on 12/21/2015   Past Month at Unknown time  . baclofen (LIORESAL) 10 MG tablet Take 1 tablet by mouth 2 (two) times daily  as needed.  0 Past Month at Unknown time  . Cholecalciferol (VITAMIN D3) 5000 UNITS CAPS Take 1 capsule by mouth daily.   Past Week at Unknown time  . cyclobenzaprine (FLEXERIL) 5 MG tablet Take 1 tablet by mouth daily as needed.    12/22/2015 at Unknown time  . Homeopathic Products (LIVER SUPPORT SL) Take by mouth daily. Reported on 12/21/2015   Past Week at Unknown time  . levothyroxine (SYNTHROID, LEVOTHROID) 75 MCG tablet TAKE 1 TABLET BY MOUTH EVERY MORNING BEFORE BREAKFAST 90 tablet 3 Past Week at Unknown time  . MAGNESIUM GLYCINATE PLUS PO Take 200 mg by mouth daily.    Past Week at Unknown time  . Melatonin 3 MG TABS Take by mouth at bedtime.   12/22/2015 at Unknown time  . traMADol (ULTRAM) 50 MG tablet  Take 50 mg by mouth 3 (three) times daily as needed.  0 Past Week at Unknown time  . HYDROcodone-acetaminophen (NORCO/VICODIN) 5-325 MG tablet Take 1-2 tablets by mouth every 6 (six) hours as needed for moderate pain or severe pain. 12 tablet 0   . zolpidem (AMBIEN) 5 MG tablet Take 1 tablet (5 mg total) by mouth at bedtime as needed for sleep. 30 tablet 0 Unknown at Unknown time    Review of Systems  All other systems reviewed and are negative.   Blood pressure 103/68, pulse 72, temperature 98.5 F (36.9 C), temperature source Oral, resp. rate 14, height 4' 10.5" (1.486 m), weight 113 lb (51.256 kg), last menstrual period 08/25/2010, SpO2 100 %. Physical Exam  Constitutional: She is oriented to person, place, and time. She appears well-developed and well-nourished.  Cardiovascular: Normal rate and regular rhythm.   Respiratory: Effort normal and breath sounds normal.  Neurological: She is alert and oriented to person, place, and time.  Skin: Skin is warm and dry.  Psychiatric: She has a normal mood and affect.    No results found for this or any previous visit (from the past 24 hour(s)).  No results found.  Assessment/Plan: 60 yo G2P2 MWF here for hysteroscopy with D&C, possible polyp resection due to continued PMP bleeding.  Pt here and ready to proceed.  Hale Bogus SUZANNE 12/23/2015, 7:07 AM

## 2015-12-24 ENCOUNTER — Encounter (HOSPITAL_BASED_OUTPATIENT_CLINIC_OR_DEPARTMENT_OTHER): Payer: Self-pay | Admitting: Obstetrics & Gynecology

## 2016-01-07 ENCOUNTER — Ambulatory Visit (INDEPENDENT_AMBULATORY_CARE_PROVIDER_SITE_OTHER): Payer: BLUE CROSS/BLUE SHIELD | Admitting: Obstetrics & Gynecology

## 2016-01-07 ENCOUNTER — Encounter: Payer: Self-pay | Admitting: Obstetrics & Gynecology

## 2016-01-07 VITALS — BP 108/58 | HR 72 | Resp 16 | Ht 58.5 in | Wt 115.2 lb

## 2016-01-07 DIAGNOSIS — N950 Postmenopausal bleeding: Secondary | ICD-10-CM | POA: Diagnosis not present

## 2016-01-07 MED ORDER — ESTROGENS, CONJUGATED 0.625 MG/GM VA CREA: TOPICAL_CREAM | VAGINAL | Status: DC

## 2016-01-07 NOTE — Progress Notes (Signed)
Post Operative Visit  Procedure: DILATATION AND CURETTAGE /HYSTEROSCOPY  Days Post-op: 15 days  Subjective: Doing well.  D/w pt findings with pt from hysteroscopy and pathology reports.  Endometrial cavity was without any findings and pathology showed scant tissue without abnormal cells.  Pt continues to want to know where the bleeding she's seen has come from.  With this being negative and pap normal, atrophic vaginal changes is the likely cause.  She doesn't feel like she has vaginal dryness so that is partly why she wanted more aggressive management.  D/W pt that it is now very appropriate to try vaginal estrogen.  Pt and I discussed the WHI and estrogen risks.  Also discussed differences between estrogen absorption from vaginal administration vs oral administration.  D/w pt could try a non estrogen product but as she is very concerned about this, feel estrogen will give [place] the best and fastest response.  Also, highly discouraged pt from trying to look and see if she can find any blood.  She's been inserting a tampon and I feel she has been causing part of what she is seeing.  Pt voices clear understanding.  Objective: BP 108/58 mmHg  Pulse 72  Resp 16  Ht 4' 10.5" (1.486 m)  Wt 115 lb 3.2 oz (52.254 kg)  BMI 23.66 kg/m2  LMP 08/25/2010  EXAM General: alert and cooperative Resp: clear to auscultation bilaterally Cardio: regular rate and rhythm, S1, S2 normal, no murmur, click, rub or gallop GI: soft, non-tender; bowel sounds normal; no masses,  no organomegaly Extremities: extremities normal, atraumatic, no cyanosis or edema  Gyn:  NAEFG, vagina without lesions, cervix close, area where tenaculum was placed on cervix did bleed with movement with scopette, no CTM, no vaginal discharge.   Vaginal Bleeding: none  Assessment: PMP bleeding with negative evaluation with hysteroscopy and D&C  Plan: Will start Premarin vaginal cream 1/2 gm pv twice weekly.  Pt is NOT to place anything in  the vagina like a tampon to "check for bleeding" until I see her again.  She is released to return to normal activity at this point.  ~15 minutes spent with patient reviewing findings and likely cause of bleeding as well as treatment options.

## 2016-02-18 DIAGNOSIS — M47817 Spondylosis without myelopathy or radiculopathy, lumbosacral region: Secondary | ICD-10-CM | POA: Diagnosis not present

## 2016-02-18 DIAGNOSIS — M47812 Spondylosis without myelopathy or radiculopathy, cervical region: Secondary | ICD-10-CM | POA: Diagnosis not present

## 2016-02-18 DIAGNOSIS — G894 Chronic pain syndrome: Secondary | ICD-10-CM | POA: Diagnosis not present

## 2016-02-18 DIAGNOSIS — M5030 Other cervical disc degeneration, unspecified cervical region: Secondary | ICD-10-CM | POA: Diagnosis not present

## 2016-02-23 ENCOUNTER — Other Ambulatory Visit (HOSPITAL_BASED_OUTPATIENT_CLINIC_OR_DEPARTMENT_OTHER): Payer: Self-pay | Admitting: Physical Medicine and Rehabilitation

## 2016-02-23 DIAGNOSIS — M542 Cervicalgia: Secondary | ICD-10-CM | POA: Diagnosis not present

## 2016-02-23 DIAGNOSIS — M5416 Radiculopathy, lumbar region: Secondary | ICD-10-CM

## 2016-02-26 ENCOUNTER — Ambulatory Visit (HOSPITAL_BASED_OUTPATIENT_CLINIC_OR_DEPARTMENT_OTHER)
Admission: RE | Admit: 2016-02-26 | Discharge: 2016-02-26 | Disposition: A | Discharge status: Home / Self Care | Payer: BLUE CROSS/BLUE SHIELD | Source: Ambulatory Visit | Attending: Physical Medicine and Rehabilitation | Admitting: Physical Medicine and Rehabilitation

## 2016-02-26 DIAGNOSIS — M5416 Radiculopathy, lumbar region: Secondary | ICD-10-CM

## 2016-02-26 DIAGNOSIS — M5127 Other intervertebral disc displacement, lumbosacral region: Secondary | ICD-10-CM | POA: Diagnosis not present

## 2016-02-26 DIAGNOSIS — M5126 Other intervertebral disc displacement, lumbar region: Secondary | ICD-10-CM | POA: Diagnosis not present

## 2016-02-26 DIAGNOSIS — M79662 Pain in left lower leg: Secondary | ICD-10-CM | POA: Insufficient documentation

## 2016-02-26 DIAGNOSIS — M5136 Other intervertebral disc degeneration, lumbar region: Secondary | ICD-10-CM | POA: Diagnosis not present

## 2016-03-09 ENCOUNTER — Ambulatory Visit (INDEPENDENT_AMBULATORY_CARE_PROVIDER_SITE_OTHER): Payer: BLUE CROSS/BLUE SHIELD | Admitting: Obstetrics & Gynecology

## 2016-03-09 VITALS — BP 118/78 | HR 76 | Resp 12 | Ht 58.5 in | Wt 116.2 lb

## 2016-03-09 DIAGNOSIS — G43009 Migraine without aura, not intractable, without status migrainosus: Secondary | ICD-10-CM | POA: Diagnosis not present

## 2016-03-09 DIAGNOSIS — N950 Postmenopausal bleeding: Secondary | ICD-10-CM

## 2016-03-09 NOTE — Progress Notes (Signed)
GYNECOLOGY  VISIT   HPI: 60 y.o. G2P2 Married Caucasian female here for follow up of PMP bleeding.  Pt underwent evaluation with ultrasound, endometrial biopsy and hysteroscopy which all showed benign findings.  She was started on estrogen vaginal cream and has been using twice weekly.  This has resulted in resolution of symptoms.  Pt has not placed a tampon in the vagina since starting but would like to (if I agree) just to be sure there is no further bleeding.  Advised pt that would be fine.  Pt reports migraine continue about twice monthly.  recently saw Dr. Earley Favor who advised her to stop her Maxalt.  Now she has nothing to take for her headaches that really works.  Has baclofen and ultram.  Wants to know if HRT would help.  I do not feel it would help and I feel there are increased risks with starting this far into menopause.  However, I am not sure why she was advised to stop her maxalt unless it is age related.  I reviewed Up To Date and cannot find this as a recommendation.  She has no other contraindications.  Pt would, very much, like a second opinion about use of Maxalt.  Will refer to Dr. Jannifer Franklin for second opinion regarding this.  GYNECOLOGIC HISTORY: Patient's last menstrual period was 08/25/2010. Contraception: Post menopausal  Menopausal hormone therapy: Premarin vaginal cream.  Patient Active Problem List   Diagnosis Date Noted  . Urinary tract infectious disease 09/07/2015  . H/O partial thyroidectomy 01/14/2014  . Family history of premature CAD 09/01/2013  . Palpitations 02/21/2013  . History of migraine headaches 02/21/2013  . Hyperlipidemia 02/21/2013  . Thyroid function study abnormality 02/21/2013    Past Medical History:  Diagnosis Date  . Arthritis   . Diverticulosis   . Elevated LFTs    monitored by pcp per pt --  unknown etiology  . Family history of premature CAD 09/01/2013  . History of basal cell carcinoma excision    2004   &  2014  . History of colitis    . History of palpitations   . 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 IBS (irritable bowel syndrome)   . Migraine   . PMB (postmenopausal bleeding)   . Post-surgical hypothyroidism     Past Surgical History:  Procedure Laterality Date  . APPENDECTOMY  as child  . BREAST BIOPSY Left 1979   benign  . CARDIOVASCULAR STRESS TEST  09-16-2013   normal nuclear study w/ no ischemia/  normal LV function and wall motion , ef 84%  . CESAREAN SECTION  1977 and 1978   Bilateral Tubal Ligation w/ last c/s  . COLPOSCOPY  05/2009   CIN 1  . HYSTEROSCOPY W/D&C N/A 12/23/2015   Procedure: DILATATION AND CURETTAGE /HYSTEROSCOPY ;  Surgeon: Megan Salon, MD;  Location: [facility];  Service: Gynecology;  Laterality: N/A;  . NEGATIVE SLEEP STUDY  2014  per pt  . OOPHORECTOMY Right 1985   ruptured cyst   . THYROID LOBECTOMY Right 03-08-2011  . TRANSTHORACIC ECHOCARDIOGRAM  05-09-2011   ef 64%/  mild MR and TR    MEDS:  Reviewed in EPIC and UTD  ALLERGIES: Codeine; Demerol [meperidine]; Erythromycin; Sulfa antibiotics; and Ampicillin  Family History  Problem Relation Age of Onset  . Lung cancer Mother 26  . Hypertension Mother 62  . Hyperlipidemia Mother  2  . Ehlers-Danlos syndrome Mother   . Kidney disease Mother   . Hypertension Maternal Grandmother   . Uterine cancer Maternal Grandmother   . Vascular Disease Maternal Grandmother   . Stroke Maternal Grandfather   . Prostate cancer Paternal Grandfather   . Other Father     bypass surgery times 2    SH:  Married, non smoker  Review of Systems  All other systems reviewed and are negative.   PHYSICAL EXAMINATION:    BP 118/78 (BP Location: Right Arm, Patient Position: Sitting, Cuff Size: Normal)   Pulse 76   Resp 12   Ht 4' 10.5" (1.486 m)   Wt 116 lb 3.2 oz (52.7 kg)   LMP 08/25/2010   BMI 23.87 kg/m     General appearance: alert,  cooperative and appears stated age Abdomen: soft, non-tender; bowel sounds normal; no masses,  no organomegaly  Pelvic: External genitalia:  no lesions              Urethra:  normal appearing urethra with no masses, tenderness or lesions              Bartholins and Skenes: normal                 Vagina: normal appearing vagina with normal color and discharge, no lesions              Cervix: no lesions              Bimanual Exam:  Uterus:  normal size, contour, position, consistency, mobility, non-tender              Adnexa: no mass, fullness, tenderness              Anus:  normal sphincter tone, no lesions  Chaperone was present for exam.  Assessment: H/O PMP bleeding that has resolved with use of vaginal estrogen cream.  W/U had been negative. H/O migraines, recently advised to not use Maxalt any longer.    Plan: Pt is going to decrease vaginal estrogen and see if weekly or less usage will be adequate.  Will call when needs RF. Referral to Dr. Jannifer Franklin for second opinion regarding maxalt use for her migraines vs other therapeutic options.   ~20 minutes spent with patient >50% of time was in face to face discussion of above.

## 2016-03-12 ENCOUNTER — Encounter: Payer: Self-pay | Admitting: Obstetrics & Gynecology

## 2016-03-14 ENCOUNTER — Telehealth: Payer: Self-pay | Admitting: Obstetrics & Gynecology

## 2016-03-14 NOTE — Telephone Encounter (Signed)
Spoke with patient. Patient calling to give update to Dr. Sabra Heck in reference to 03/09/16 OV. Patient states she has inserted tampons on 2 different occasions and they have been clean each time. Patient reports no blood/bleeding. Patient states using premarin cream 2 times per week. Advised will forward to Dr. Sabra Heck for review. Patient is agreeable.    Dr. Sabra Heck,  Any additional follow-up? OK to close encounter?

## 2016-03-14 NOTE — Telephone Encounter (Signed)
Patient calling to give the nurse an update.

## 2016-03-15 NOTE — Telephone Encounter (Signed)
Spoke with patient. Advised as requested as seen below by Dr. Sabra Heck. Patient is agreeable.   Routing to provider for final review. Patient is agreeable to disposition. Will close encounter.

## 2016-03-15 NOTE — Telephone Encounter (Signed)
No additional follow up needed.  She does not need to repeat the test she described.  Ok to continue the vaginal estrogen 1-2 times weekly and return for AEX.

## 2016-03-27 DIAGNOSIS — M7062 Trochanteric bursitis, left hip: Secondary | ICD-10-CM | POA: Diagnosis not present

## 2016-04-06 ENCOUNTER — Encounter: Payer: Self-pay | Admitting: Neurology

## 2016-04-06 ENCOUNTER — Ambulatory Visit (INDEPENDENT_AMBULATORY_CARE_PROVIDER_SITE_OTHER): Payer: BLUE CROSS/BLUE SHIELD | Admitting: Neurology

## 2016-04-06 DIAGNOSIS — G43019 Migraine without aura, intractable, without status migrainosus: Secondary | ICD-10-CM | POA: Diagnosis not present

## 2016-04-06 HISTORY — DX: Migraine without aura, intractable, without status migrainosus: G43.019

## 2016-04-06 MED ORDER — NARATRIPTAN HCL 2.5 MG PO TABS: 2.5000 mg | ORAL_TABLET | Freq: Two times a day (BID) | ORAL | 3 refills | Status: DC | PRN

## 2016-04-06 MED ORDER — GABAPENTIN 100 MG PO CAPS: ORAL_CAPSULE | ORAL | 3 refills | Status: DC

## 2016-04-06 NOTE — Patient Instructions (Addendum)
At the onset of the headache take Amerge twice a day for at least 3 days. Neurontin (gabapentin) may result in drowsiness, ankle swelling, gait instability, or possibly dizziness. Please contact our office if significant side effects occur with this medication.  Migraine Headache A migraine headache is an intense, throbbing pain on one or both sides of your head. A migraine can last for 30 minutes to several hours. CAUSES  The exact cause of a migraine headache is not always known. However, a migraine may be caused when nerves in the brain become irritated and release chemicals that cause inflammation. This causes pain. Certain things may also trigger migraines, such as:  Alcohol.  Smoking.  Stress.  Menstruation.  Aged cheeses.  Foods or drinks that contain nitrates, glutamate, aspartame, or tyramine.  Lack of sleep.  Chocolate.  Caffeine.  Hunger.  Physical exertion.  Fatigue.  Medicines used to treat chest pain (nitroglycerine), birth control pills, estrogen, and some blood pressure medicines. SIGNS AND SYMPTOMS  Pain on one or both sides of your head.  Pulsating or throbbing pain.  Severe pain that prevents daily activities.  Pain that is aggravated by any physical activity.  Nausea, vomiting, or both.  Dizziness.  Pain with exposure to bright lights, loud noises, or activity.  General sensitivity to bright lights, loud noises, or smells. Before you get a migraine, you may get warning signs that a migraine is coming (aura). An aura may include:  Seeing flashing lights.  Seeing bright spots, halos, or zigzag lines.  Having tunnel vision or blurred vision.  Having feelings of numbness or tingling.  Having trouble talking.  Having muscle weakness. DIAGNOSIS  A migraine headache is often diagnosed based on:  Symptoms.  Physical exam.  A CT scan or MRI of your head. These imaging tests cannot diagnose migraines, but they can help rule out other  causes of headaches. TREATMENT Medicines may be given for pain and nausea. Medicines can also be given to help prevent recurrent migraines.  HOME CARE INSTRUCTIONS  Only take over-the-counter or prescription medicines for pain or discomfort as directed by your health care provider. The use of long-term narcotics is not recommended.  Lie down in a dark, quiet room when you have a migraine.  Keep a journal to find out what may trigger your migraine headaches. For example, write down:  What you eat and drink.  How much sleep you get.  Any change to your diet or medicines.  Limit alcohol consumption.  Quit smoking if you smoke.  Get 7-9 hours of sleep, or as recommended by your health care provider.  Limit stress.  Keep lights dim if bright lights bother you and make your migraines worse. SEEK IMMEDIATE MEDICAL CARE IF:   Your migraine becomes severe.  You have a fever.  You have a stiff neck.  You have vision loss.  You have muscular weakness or loss of muscle control.  You start losing your balance or have trouble walking.  You feel faint or pass out.  You have severe symptoms that are different from your first symptoms. MAKE SURE YOU:   Understand these instructions.  Will watch your condition.  Will get help right away if you are not doing well or get worse.   This information is not intended to replace advice given to you by your health care provider. Make sure you discuss any questions you have with your health care provider.   Document Released: 06/12/2005 Document Revised: 07/03/2014 Document Reviewed: 02/17/2013  Chartered certified accountant Patient Education [employer].

## 2016-04-06 NOTE — Progress Notes (Signed)
Reason for visit: Migraine headache  Referring physician: Dr. Marlyne Miles is a 60 y.o. female  History of present illness:  Kimberly Miles is a 60 year old right-handed white female with a history of migraine headaches since around 1988. The patient has been treated with a various number of medications without full benefit. She indicates that in the past her headaches have been associated with her menstrual cycle, occurring on average twice a month, but the headaches may last anywhere from 3-5 days. The patient may have significant nausea and vomiting with the headache, she may be incapacitated for one or 2 days a month associated with her headache. The longest she has gone without any headaches is 45 days. She reports that the headaches will begin on the top of the head, then spread to one side or the other, she may have some neck discomfort, crepitus in the neck and in the right TMJ. The patient denies any visual field changes, she does have photophobia and phonophobia. She may be sensitive to odors. She reports some slight cognitive clouding with the headache. She reports that her father and her paternal grandmother also had migraine. She does have some underlying neck discomfort and shoulder pain. She has been tried on Topamax, Paxil, Phenergan, Reglan, baclofen, and Maxalt in the past. She has also taken Imitrex. She has had trigger point injections. None of the above therapies have been extremely effective. She comes to this office for further evaluation.  Past Medical History:  Diagnosis Date  . Anxiety   . Arthritis   . Common migraine with intractable migraine 04/06/2016  . Diverticulosis   . Elevated LFTs    monitored by pcp per pt --  unknown etiology  . Family history of premature CAD 09/01/2013  . History of basal cell carcinoma excision    2004   &  2014  . History of colitis   . History of palpitations   . 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 IBS (irritable bowel syndrome)   . Migraine   . MTHFR gene mutation ([place])   . PMB (postmenopausal bleeding)   . Post-surgical hypothyroidism     Past Surgical History:  Procedure Laterality Date  . APPENDECTOMY  as child  . BREAST BIOPSY Left 1979   benign  . CARDIOVASCULAR STRESS TEST  09-16-2013   normal nuclear study w/ no ischemia/  normal LV function and wall motion , ef 84%  . CESAREAN SECTION  1977 and 1978   Bilateral Tubal Ligation w/ last c/s  . COLPOSCOPY  05/2009   CIN 1  . HYSTEROSCOPY W/D&C N/A 12/23/2015   Procedure: DILATATION AND CURETTAGE /HYSTEROSCOPY ;  Surgeon: Megan Salon, MD;  Location: [facility];  Service: Gynecology;  Laterality: N/A;  . NEGATIVE SLEEP STUDY  2014  per pt  . OOPHORECTOMY Right 1985   ruptured cyst   . THYROID LOBECTOMY Right 03-08-2011  . TRANSTHORACIC ECHOCARDIOGRAM  05-09-2011   ef 64%/  mild MR and TR    Family History  Problem Relation Age of Onset  . Hypertension Maternal Grandmother   . Uterine cancer Maternal Grandmother   . Vascular Disease Maternal Grandmother   . Lung cancer Maternal Grandmother   . Stroke Maternal Grandfather   . Prostate cancer Paternal Grandfather   . Lung cancer Mother 24  . Hypertension Mother 43  . Hyperlipidemia Mother  82  . Ehlers-Danlos syndrome Mother   . Kidney disease Mother   . Fibromyalgia Mother   . Other Father     bypass surgery times 2    Social history:  reports that she has never smoked. She has never used smokeless tobacco. She reports that she drinks alcohol. She reports that she does not use drugs.  Medications:  Prior to Admission medications   Medication Sig Start Date End Date Taking? Authorizing Provider  b complex vitamins tablet Take 1 tablet by mouth daily. Reported on 12/21/2015   Yes Historical Provider, MD  Cholecalciferol (VITAMIN D3) 5000 UNITS CAPS Take 1 capsule by mouth daily.   Yes  Historical Provider, MD  conjugated estrogens (PREMARIN) vaginal cream 1/2 gram vaginally twice weekly 01/07/16  Yes Megan Salon, MD  cyclobenzaprine (FLEXERIL) 5 MG tablet Take 1 tablet by mouth daily as needed.  05/15/14  Yes Historical Provider, MD  Homeopathic Products (LIVER SUPPORT SL) Take by mouth daily. Reported on 12/21/2015   Yes Historical Provider, MD  levothyroxine (SYNTHROID, LEVOTHROID) 75 MCG tablet TAKE 1 TABLET BY MOUTH EVERY MORNING BEFORE BREAKFAST 11/26/15  Yes Yvonne R Lowne Chase, DO  Melatonin 3 MG TABS Take by mouth at bedtime.   Yes Historical Provider, MD  UNABLE TO FIND Take 5 mg by mouth daily. Med Name: CBD   Yes Historical Provider, MD  traMADol (ULTRAM) 50 MG tablet Take 50 mg by mouth 3 (three) times daily as needed. 09/26/15   Historical Provider, MD  zolpidem (AMBIEN) 5 MG tablet Take 1 tablet (5 mg total) by mouth at bedtime as needed for sleep. Patient not taking: Reported on 04/06/2016 10/30/14   Ann Held, DO      Allergies  Allergen Reactions  . Codeine Nausea Only  . Demerol [Meperidine] Nausea And Vomiting  . Erythromycin Other (See Comments)    Stomach upset  . Sulfa Antibiotics Other (See Comments)    unknown reaction  . Ampicillin Rash    ROS:  Out of a complete 14 system review of symptoms, the patient complains only of the following symptoms, and all other reviewed systems are negative.  Fatigue Ringing in the years Eye pain Constipation Joint pain, muscle cramps, aching muscles Headache Vibration sensation  Blood pressure 118/79, pulse 72, height 4' 10.5" (1.486 m), weight 115 lb (52.2 kg), last menstrual period 08/25/2010.  Physical Exam  General: The patient is alert and cooperative at the time of the examination.  Eyes: Pupils are equal, round, and reactive to light. Discs are flat bilaterally.  Neck: The neck is supple, no carotid bruits are noted.  Respiratory: The respiratory examination is  clear.  Cardiovascular: The cardiovascular examination reveals a regular rate and rhythm, no obvious murmurs or rubs are noted.  Neuromuscular: Range of movement of the cervical spine is full.  Skin: Extremities are without significant edema.  Neurologic Exam  Mental status: The patient is alert and oriented x 3 at the time of the examination. The patient has apparent normal recent and remote memory, with an apparently normal attention span and concentration ability.  Cranial nerves: Facial symmetry is present. There is good sensation of the face to pinprick and soft touch bilaterally. The strength of the facial muscles and the muscles to head turning and shoulder shrug are normal bilaterally. Speech is well enunciated, no aphasia or dysarthria is noted. Extraocular movements are full. Visual fields are full. The tongue is midline, and the patient has symmetric elevation of  the soft palate. No obvious hearing deficits are noted.  Motor: The motor testing reveals 5 over 5 strength of all 4 extremities. Good symmetric motor tone is noted throughout.  Sensory: Sensory testing is intact to pinprick, soft touch, vibration sensation, and position sense on all 4 extremities. No evidence of extinction is noted.  Coordination: Cerebellar testing reveals good finger-nose-finger and heel-to-shin bilaterally.  Gait and station: Gait is normal. Tandem gait is normal. Romberg is negative. No drift is seen.  Reflexes: Deep tendon reflexes are symmetric and normal bilaterally. Toes are downgoing bilaterally.   Assessment/Plan:  1. Intractable migraine headache  The patient has had a long duration of intractable prolonged headache. The patient will be given a trial on gabapentin, and she will be placed on Amerge to take 1 tablet twice a day for 3 days once the headache begins. She will call for dose adjustments, and she will follow-up in 4 months.  Jill Alexanders MD 04/06/2016 11:55 AM  [facility] [address] [place], [place] [address]  Phone [phone] Fax [phone]

## 2016-05-25 DIAGNOSIS — M542 Cervicalgia: Secondary | ICD-10-CM | POA: Diagnosis not present

## 2016-07-21 ENCOUNTER — Ambulatory Visit: Payer: BLUE CROSS/BLUE SHIELD | Admitting: Certified Nurse Midwife

## 2016-07-27 DIAGNOSIS — H2513 Age-related nuclear cataract, bilateral: Secondary | ICD-10-CM | POA: Diagnosis not present

## 2016-07-27 DIAGNOSIS — H25013 Cortical age-related cataract, bilateral: Secondary | ICD-10-CM | POA: Diagnosis not present

## 2016-07-27 DIAGNOSIS — H43811 Vitreous degeneration, right eye: Secondary | ICD-10-CM | POA: Diagnosis not present

## 2016-07-27 DIAGNOSIS — D3132 Benign neoplasm of left choroid: Secondary | ICD-10-CM | POA: Diagnosis not present

## 2016-08-14 ENCOUNTER — Ambulatory Visit: Payer: BLUE CROSS/BLUE SHIELD | Admitting: Neurology

## 2016-08-22 ENCOUNTER — Encounter: Payer: Self-pay | Admitting: Family Medicine

## 2016-08-23 ENCOUNTER — Encounter: Payer: Self-pay | Admitting: Family Medicine

## 2016-08-23 DIAGNOSIS — M542 Cervicalgia: Secondary | ICD-10-CM | POA: Diagnosis not present

## 2016-08-23 NOTE — Progress Notes (Unsigned)
Interpretation:  Od: Normal, Stable prior to Studies. Continue on current treatment regimen. Continue to monitor. 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 Continue current treatment regimen. Continue to monitor.

## 2016-08-28 ENCOUNTER — Telehealth: Payer: Self-pay | Admitting: *Deleted

## 2016-08-28 NOTE — Telephone Encounter (Signed)
Called and spoke to pt. Offered 12pm opening tomorrow with CW,MD. She declined. She is unable to make that. She will keep appt for 11/13/16 but still would like to be called if something else opens up.

## 2016-09-06 ENCOUNTER — Telehealth: Payer: Self-pay | Admitting: *Deleted

## 2016-09-06 NOTE — Telephone Encounter (Addendum)
Called and LVM for pt advising Dr Jannifer Franklin has some openings today if she wanted to come in. She is still on wait list.   *Right now there is a 12pm. Skype me to make sure before scheduling if she calls, thank you

## 2016-11-13 ENCOUNTER — Ambulatory Visit (INDEPENDENT_AMBULATORY_CARE_PROVIDER_SITE_OTHER): Payer: BLUE CROSS/BLUE SHIELD | Admitting: Neurology

## 2016-11-13 ENCOUNTER — Encounter: Payer: Self-pay | Admitting: Neurology

## 2016-11-13 VITALS — BP 125/79 | HR 69 | Ht 58.5 in | Wt 120.5 lb

## 2016-11-13 DIAGNOSIS — R413 Other amnesia: Secondary | ICD-10-CM | POA: Diagnosis not present

## 2016-11-13 DIAGNOSIS — G43019 Migraine without aura, intractable, without status migrainosus: Secondary | ICD-10-CM

## 2016-11-13 NOTE — Progress Notes (Signed)
Reason for visit: Migraine headache  Kimberly Miles is an 61 y.o. female  History of present illness:  Kimberly Miles is a 61 year old right-handed white female with a history of migraine headache. The patient will have at least 2 headaches a month, but the headaches may last from 3-5 days at a time. The patient was given gabapentin when last seen but she never took the medication for fear of side effects. The patient has not had any change in headache frequency or severity. The patient has Amerge to take which has offered some benefit. The patient is interested in getting on the new CGRP medication that is to be released in the near future. The patient has been on Topamax previously which she did not tolerate. The patient has also been on baclofen without benefit. The patient also complains of some problems with focusing and with memory since 1990, she does not think there has been much of a decline in memory over time, but she cannot keep track of things well. The patient also has a total body vibration sensation that will come and go. She reports some word finding problems. She returns to this office for an evaluation.  Past Medical History:  Diagnosis Date  . Anxiety   . Arthritis   . Common migraine with intractable migraine 04/06/2016  . Diverticulosis   . Elevated LFTs    monitored by pcp per pt --  unknown etiology  . Family history of premature CAD 09/01/2013  . History of basal cell carcinoma excision    2004   &  2014  . History of colitis   . History of palpitations   . 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 IBS (irritable bowel syndrome)   . Migraine   . MTHFR gene mutation ([place])   . PMB (postmenopausal bleeding)   . Post-surgical hypothyroidism     Past Surgical History:  Procedure Laterality Date  . APPENDECTOMY  as child  . BREAST BIOPSY Left 1979   benign  . CARDIOVASCULAR STRESS TEST   09-16-2013   normal nuclear study w/ no ischemia/  normal LV function and wall motion , ef 84%  . CESAREAN SECTION  1977 and 1978   Bilateral Tubal Ligation w/ last c/s  . COLPOSCOPY  05/2009   CIN 1  . HYSTEROSCOPY W/D&C N/A 12/23/2015   Procedure: DILATATION AND CURETTAGE /HYSTEROSCOPY ;  Surgeon: Megan Salon, MD;  Location: [facility];  Service: Gynecology;  Laterality: N/A;  . NEGATIVE SLEEP STUDY  2014  per pt  . OOPHORECTOMY Right 1985   ruptured cyst   . THYROID LOBECTOMY Right 03-08-2011  . TRANSTHORACIC ECHOCARDIOGRAM  05-09-2011   ef 64%/  mild MR and TR    Family History  Problem Relation Age of Onset  . Hypertension Maternal Grandmother   . Uterine cancer Maternal Grandmother   . Vascular Disease Maternal Grandmother   . Lung cancer Maternal Grandmother   . Stroke Maternal Grandfather   . Prostate cancer Paternal Grandfather   . Migraines Paternal Grandmother   . Lung cancer Mother 62  . Hypertension Mother 71  . Hyperlipidemia Mother 71  . Ehlers-Danlos syndrome Mother   . Kidney disease Mother   . Fibromyalgia Mother   . Other Father        bypass surgery times 2  . Migraines Father     Social history:  reports that she has never smoked. She has never used smokeless tobacco. She reports that she drinks alcohol. She reports that she does not use drugs.    Allergies  Allergen Reactions  . Codeine Nausea Only  . Demerol [Meperidine] Nausea And Vomiting  . Erythromycin Other (See Comments)    Stomach upset  . Sulfa Antibiotics Other (See Comments)    unknown reaction  . Ampicillin Rash    Medications:  Prior to Admission medications   Medication Sig Start Date End Date Taking? Authorizing Provider  Cholecalciferol (VITAMIN D3) 5000 UNITS CAPS Take 1 capsule by mouth daily.   Yes [provider]  conjugated estrogens (PREMARIN) vaginal cream 1/2 gram vaginally twice weekly 01/07/16  Yes Megan Salon, MD  cyclobenzaprine  (FLEXERIL) 5 MG tablet Take 1 tablet by mouth daily as needed.  05/15/14  Yes [provider]  Homeopathic Products (LIVER SUPPORT SL) Take by mouth daily. Reported on 12/21/2015   Yes [provider]  levothyroxine (SYNTHROID, LEVOTHROID) 75 MCG tablet TAKE 1 TABLET BY MOUTH EVERY MORNING BEFORE BREAKFAST 11/26/15  Yes Ann Held, DO  Melatonin 3 MG TABS Take 1 mg by mouth at bedtime.    Yes [provider]  naratriptan (AMERGE) 2.5 MG tablet Take 1 tablet (2.5 mg total) by mouth 2 (two) times daily as needed for migraine. 04/06/16  Yes Kathrynn Ducking, MD  traMADol (ULTRAM) 50 MG tablet Take 50 mg by mouth 3 (three) times daily as needed. 09/26/15  Yes [provider]  zolpidem (AMBIEN) 5 MG tablet Take 1 tablet (5 mg total) by mouth at bedtime as needed for sleep. 10/30/14  Yes Roma Schanz R, DO    ROS:  Out of a complete 14 system review of symptoms, the patient complains only of the following symptoms, and all other reviewed systems are negative.  Weight gain Constipation Back pain, muscle cramps Decreased concentration  Blood pressure 125/79, pulse 69, height 4' 10.5" (1.486 m), weight 120 lb 8 oz (54.7 kg), last menstrual period 08/25/2010.  Physical Exam  General: The patient is alert and cooperative at the time of the examination.  Skin: No significant peripheral edema is noted.   Neurologic Exam  Mental status: The patient is alert and oriented x 3 at the time of the examination. The patient has apparent normal recent and remote memory, with an apparently normal attention span and concentration ability. Mini-Mental Status Examination done today shows a total score of 30/30.   Cranial nerves: Facial symmetry is present. Speech is normal, no aphasia or dysarthria is noted. Extraocular movements are full. Visual fields are full.  Motor: The patient has good strength in all 4 extremities.  Sensory examination: Soft touch  sensation is symmetric on the face, arms, and legs.  Coordination: The patient has good finger-nose-finger and heel-to-shin bilaterally.  Gait and station: The patient has a normal gait. Tandem gait is normal. Romberg is negative. No drift is seen.  Reflexes: Deep tendon reflexes are symmetric.   Assessment/Plan:  1. Migraine headache  2. Reported memory disturbance  3. Back pain, muscle pain  The patient will try gabapentin as this may help migraine as well as her or muscular pain. She reports some problems with memory, we will check MRI the brain. The patient will call us for any dose adjustments of the medication, she will follow-up in 4 months.  Jill Alexanders MD 11/13/2016 4:17 PM  [facility] [place]  [place], [place] [address]  Phone [phone] Fax [phone]

## 2016-11-13 NOTE — Patient Instructions (Addendum)
   We will start on the gabapentin 100 mg taking one tablet twice a day for 1 week, then take one in the morning and two in the evening. Call for any dose adjustments.  We will check MRI of the brain.

## 2016-11-14 ENCOUNTER — Telehealth: Payer: Self-pay | Admitting: Neurology

## 2016-11-14 MED ORDER — NARATRIPTAN HCL 2.5 MG PO TABS: 2.5000 mg | ORAL_TABLET | Freq: Two times a day (BID) | ORAL | 3 refills | Status: DC | PRN

## 2016-11-14 NOTE — Telephone Encounter (Signed)
Patient called office requesting refill for naratriptan (AMERGE) 2.5 MG tablet.  Pharmacy- [facility].

## 2016-11-14 NOTE — Addendum Note (Signed)
Addended by: Hope Pigeon on: 11/14/2016 09:36 AM   Modules accepted: Orders

## 2016-11-14 NOTE — Telephone Encounter (Signed)
E-scribed rx refills to pt pharmacy as requested.

## 2016-11-15 DIAGNOSIS — Z1231 Encounter for screening mammogram for malignant neoplasm of breast: Secondary | ICD-10-CM | POA: Diagnosis not present

## 2016-11-21 DIAGNOSIS — R921 Mammographic calcification found on diagnostic imaging of breast: Secondary | ICD-10-CM | POA: Diagnosis not present

## 2016-11-22 ENCOUNTER — Other Ambulatory Visit: Payer: BLUE CROSS/BLUE SHIELD

## 2016-11-22 DIAGNOSIS — M542 Cervicalgia: Secondary | ICD-10-CM | POA: Diagnosis not present

## 2016-11-24 ENCOUNTER — Ambulatory Visit (INDEPENDENT_AMBULATORY_CARE_PROVIDER_SITE_OTHER): Payer: BLUE CROSS/BLUE SHIELD | Admitting: Medical

## 2016-11-24 VITALS — BP 127/72 | HR 79 | Temp 98.2°F | Resp 16 | Ht <= 58 in | Wt 116.6 lb

## 2016-11-24 DIAGNOSIS — K0889 Other specified disorders of teeth and supporting structures: Secondary | ICD-10-CM

## 2016-11-24 DIAGNOSIS — J3489 Other specified disorders of nose and nasal sinuses: Secondary | ICD-10-CM | POA: Diagnosis not present

## 2016-11-24 DIAGNOSIS — H9203 Otalgia, bilateral: Secondary | ICD-10-CM | POA: Diagnosis not present

## 2016-11-24 DIAGNOSIS — J029 Acute pharyngitis, unspecified: Secondary | ICD-10-CM

## 2016-11-24 DIAGNOSIS — R067 Sneezing: Secondary | ICD-10-CM | POA: Diagnosis not present

## 2016-11-24 MED ORDER — FLUTICASONE PROPIONATE 50 MCG/ACT NA SUSP: 2.0000 | Freq: Every day | NASAL | 1 refills | Status: DC

## 2016-11-24 MED ORDER — BENZONATATE 100 MG PO CAPS: 100.0000 mg | ORAL_CAPSULE | Freq: Three times a day (TID) | ORAL | 0 refills | Status: DC | PRN

## 2016-11-24 MED ORDER — AZITHROMYCIN 250 MG PO TABS: ORAL_TABLET | ORAL | 0 refills | Status: DC

## 2016-11-24 NOTE — Patient Instructions (Addendum)
For nasal congestion recently and sneezing rx flonase.  For cough rx benzonatate.  For possible throat infection and sinus infection rx azithromycin.  Rapid strep test negative but sending out throat culture.  Follow up in 7 days or as needed

## 2016-11-24 NOTE — Progress Notes (Signed)
Subjective:    Patient ID: Kimberly Miles, female    DOB: 29-Aug-1955, 61 y.o.   MRN: [id number]  HPI  Pt in stated about 9 days ago laryngitis, nasal congestion, mild ha, mild st, and faint sinus pressure. Pt states st is not improving. She points to submandibular node as source of pain. Also reports some left side lower jaw are pain but also states dental procedure about 10 days.   Review of Systems  Constitutional: Positive for fatigue. Negative for chills and fever.       Mild fatigue  HENT: Positive for congestion, sinus pain, sinus pressure and sore throat. Negative for postnasal drip and trouble swallowing.        Faint ear pressure recently.   Respiratory: Positive for cough. Negative for choking, chest tightness, shortness of breath and wheezing.   Cardiovascular: Negative for chest pain and palpitations.  Gastrointestinal: Negative for abdominal distention, abdominal pain, diarrhea and nausea.  Musculoskeletal: Negative for back pain and myalgias.  Neurological: Positive for headaches. Negative for dizziness.       Faint ha early on.  Hematological: Negative for adenopathy. Does not bruise/bleed easily.  Psychiatric/Behavioral: Negative for behavioral problems, confusion and decreased concentration.     Past Medical History:  Diagnosis Date  . Anxiety   . Arthritis   . Common migraine with intractable migraine 04/06/2016  . Diverticulosis   . Elevated LFTs    monitored by pcp per pt --  unknown etiology  . Family history of premature CAD 09/01/2013  . History of basal cell carcinoma excision    2004   &  2014  . History of colitis   . History of palpitations   . 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 IBS (irritable bowel syndrome)   . Migraine   . MTHFR gene mutation ([place])   . PMB (postmenopausal bleeding)   . Post-surgical hypothyroidism      Social History   Social History  .  Marital status: Married    Spouse name: N/A  . Number of children: 2  . Years of education: 12   Occupational History  . Retired    Social History Main Topics  . Smoking status: Never Smoker  . Smokeless tobacco: Never Used  . Alcohol use 0.0 oz/week  . Drug use: No  . Sexual activity: Yes    Partners: Male    Birth control/ protection: Post-menopausal     Comment: BTL done w/ last C/S   Other Topics Concern  . Not on file   Social History Narrative   Lives at home w/ her husband   Right-handed   Caffeine: 1/4 cup per day    Past Surgical History:  Procedure Laterality Date  . APPENDECTOMY  as child  . BREAST BIOPSY Left 1979   benign  . CARDIOVASCULAR STRESS TEST  09-16-2013   normal nuclear study w/ no ischemia/  normal LV function and wall motion , ef 84%  . CESAREAN SECTION  1977 and 1978   Bilateral Tubal Ligation w/ last c/s  . COLPOSCOPY  05/2009   CIN 1  . HYSTEROSCOPY W/D&C N/A 12/23/2015   Procedure: DILATATION AND CURETTAGE /HYSTEROSCOPY ;  Surgeon: Megan Salon, MD;  Location: [facility];  Service: Gynecology;  Laterality: N/A;  . NEGATIVE SLEEP STUDY  2014  per pt  . OOPHORECTOMY Right 1985   ruptured cyst   .  THYROID LOBECTOMY Right 03-08-2011  . TRANSTHORACIC ECHOCARDIOGRAM  05-09-2011   ef 64%/  mild MR and TR    Family History  Problem Relation Age of Onset  . Hypertension Maternal Grandmother   . Uterine cancer Maternal Grandmother   . Vascular Disease Maternal Grandmother   . Lung cancer Maternal Grandmother   . Stroke Maternal Grandfather   . Prostate cancer Paternal Grandfather   . Migraines Paternal Grandmother   . Lung cancer Mother 72  . Hypertension Mother 31  . Hyperlipidemia Mother 59  . Ehlers-Danlos syndrome Mother   . Kidney disease Mother   . Fibromyalgia Mother   . Other Father        bypass surgery times 2  . Migraines Father     Allergies  Allergen Reactions  . Codeine Nausea Only  . Demerol  [Meperidine] Nausea And Vomiting  . Erythromycin Other (See Comments)    Stomach upset  . Sulfa Antibiotics Other (See Comments)    unknown reaction  . Ampicillin Rash    Current Outpatient Prescriptions on File Prior to Visit  Medication Sig Dispense Refill  . Cholecalciferol (VITAMIN D3) 5000 UNITS CAPS Take 1 capsule by mouth daily.    Marland Kitchen conjugated estrogens (PREMARIN) vaginal cream 1/2 gram vaginally twice weekly 30 g 1  . cyclobenzaprine (FLEXERIL) 5 MG tablet Take 1 tablet by mouth daily as needed.     . Homeopathic Products (LIVER SUPPORT SL) Take by mouth daily. Reported on 12/21/2015    . levothyroxine (SYNTHROID, LEVOTHROID) 75 MCG tablet TAKE 1 TABLET BY MOUTH EVERY MORNING BEFORE BREAKFAST 90 tablet 3  . Melatonin 3 MG TABS Take 1 mg by mouth at bedtime.     . naratriptan (AMERGE) 2.5 MG tablet Take 1 tablet (2.5 mg total) by mouth 2 (two) times daily as needed for migraine. 10 tablet 3  . traMADol (ULTRAM) 50 MG tablet Take 50 mg by mouth 3 (three) times daily as needed.  0  . zolpidem (AMBIEN) 5 MG tablet Take 1 tablet (5 mg total) by mouth at bedtime as needed for sleep. 30 tablet 0   No current facility-administered medications on file prior to visit.     BP 127/72 (BP Location: Left Arm, Patient Position: Sitting, Cuff Size: Normal)   Pulse 79   Temp 98.2 F (36.8 C) (Oral)   Resp 16   Ht 4\' 10"  (1.473 m)   Wt 116 lb 9.6 oz (52.9 kg)   LMP 08/25/2010   SpO2 98%   BMI 24.37 kg/m      Objective:   Physical Exam  General  Mental Status - Alert. General Appearance - Well groomed. Not in acute distress.  Skin Rashes- No Rashes.  HEENT Head- Normal. Ear Auditory Canal - Left- Normal. Right - Normal.Tympanic Membrane- Left- Normal. Right- Normal. Eye Sclera/Conjunctiva- Left- Normal. Right- Normal. Nose & Sinuses Nasal Mucosa- Left-  Boggy and Congested. Right-  Boggy and  Congested.Bilateral maxillary and frontal sinus pressure. Mouth & Throat Lips:  Upper Lip- Normal: no dryness, cracking, pallor, cyanosis, or vesicular eruption. Lower Lip-Normal: no dryness, cracking, pallor, cyanosis or vesicular eruption. Buccal Mucosa- Bilateral- No Aphthous ulcers. Oropharynx- No Discharge or Erythema. Tonsils: Characteristics- Bilateral- mild Erythema or Congestion. Size/Enlargement- Bilateral- 1+ enlargement. Discharge- bilateral-None.  Neck Neck- Supple. No Masses.   Chest and Lung Exam Auscultation: Breath Sounds:-Clear even and unlabored.  Cardiovascular Auscultation:Rythm- Regular, rate and rhythm. Murmurs & Other Heart Sounds:Ausculatation of the heart reveal- No Murmurs.  Lymphatic Head &  Neck General Head & Neck Lymphatics: Bilateral: Description- fiant enlarged sub mandibular nodes.       Assessment & Plan:  For nasal congestion recently and sneezing some rx flonase.  For cough rx benzonatate.  For possible throat infection and sinus infection rx azithromycin.  Rapid strep test negative but sending out throat culture.  Follow up in 7 days or as needed  Kiriana Worthington, Percell Miller, [employer]

## 2016-11-26 ENCOUNTER — Encounter: Payer: Self-pay | Admitting: Medical

## 2016-11-26 LAB — CULTURE, GROUP A STREP

## 2016-11-27 ENCOUNTER — Telehealth: Payer: Self-pay | Admitting: Neurology

## 2016-11-27 NOTE — Telephone Encounter (Signed)
I called the patient. The MRI will not see the maxillary area very well, he will see the nerves that supply the jaws, she has are seen a dentist regarding her pain issues and she has gotten plain x-rays.

## 2016-11-27 NOTE — Telephone Encounter (Signed)
I called the patient to remind her of her MRI appointment this up coming Wednesday. She informed me that she is having dental issues with having pain and nerve issues in the bone and face. She wanted to know since she is having this MRI will it show anything in the jaw area since she is having this pain?

## 2016-11-29 ENCOUNTER — Ambulatory Visit (INDEPENDENT_AMBULATORY_CARE_PROVIDER_SITE_OTHER): Payer: BLUE CROSS/BLUE SHIELD

## 2016-11-29 DIAGNOSIS — G43019 Migraine without aura, intractable, without status migrainosus: Secondary | ICD-10-CM | POA: Diagnosis not present

## 2016-11-29 DIAGNOSIS — R413 Other amnesia: Secondary | ICD-10-CM

## 2016-12-01 ENCOUNTER — Telehealth: Payer: Self-pay | Admitting: Neurology

## 2016-12-01 NOTE — Telephone Encounter (Signed)
I called patient. The patient has a relatively unremarkable MRI the brain, the patient does have chronic changes mainly involving the left maxillary sinus, if many of her headaches around this area some of her headaches could be sinus headaches.   MRI brain 11/30/16:  IMPRESSION:  Unremarkable MRI brain (without). No acute findings. Incidental chronic sinusitis noted.

## 2016-12-15 ENCOUNTER — Encounter: Payer: Self-pay | Admitting: Obstetrics & Gynecology

## 2017-01-18 ENCOUNTER — Ambulatory Visit (INDEPENDENT_AMBULATORY_CARE_PROVIDER_SITE_OTHER): Payer: BLUE CROSS/BLUE SHIELD | Admitting: Podiatry

## 2017-01-18 ENCOUNTER — Encounter: Payer: Self-pay | Admitting: Podiatry

## 2017-01-18 ENCOUNTER — Ambulatory Visit (INDEPENDENT_AMBULATORY_CARE_PROVIDER_SITE_OTHER): Payer: BLUE CROSS/BLUE SHIELD

## 2017-01-18 VITALS — BP 103/72 | HR 72 | Resp 16

## 2017-01-18 DIAGNOSIS — M722 Plantar fascial fibromatosis: Secondary | ICD-10-CM | POA: Diagnosis not present

## 2017-01-18 DIAGNOSIS — M76821 Posterior tibial tendinitis, right leg: Secondary | ICD-10-CM

## 2017-01-18 MED ORDER — MELOXICAM 15 MG PO TABS: 15.0000 mg | ORAL_TABLET | Freq: Every day | ORAL | 3 refills | Status: DC

## 2017-01-18 MED ORDER — METHYLPREDNISOLONE 4 MG PO TBPK: ORAL_TABLET | ORAL | 0 refills | Status: DC

## 2017-01-18 NOTE — Patient Instructions (Signed)

## 2017-01-18 NOTE — Progress Notes (Signed)
   Subjective:    Patient ID: Kimberly Miles, female    DOB: Feb 13, 1956, 61 y.o.   MRN: [id number]  HPI: Kimberly Miles presents today with a chief complaint of painful heels bilaterally. She also has pain to the medial aspect of the bilateral foot. Right seems to be worse than the left.    Review of Systems  All other systems reviewed and are negative.      Objective:   Physical Exam: Vital signs are stable alert and oriented 3. Pulses are palpable. Neurologic sensorium is intact. Deep tendon reflexes are intact. Muscle strength +5 over 5 dorsiflexion plantar flexors and inverters everters all into the musculature is intact. She has tenderness on palpation of the posterior tibial tendon as it courses beneath the medial malleolus extending to the navicular tuberosity particularly on the right foot. There appears to be fluctuance within the tendon sheath. She also has pain on palpation medial calcaneal tubercle of the right heel. 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 No open lesions or wounds are noted on cutaneous evaluation.        Assessment & Plan:  Assessment: Plantar fasciitis and posterior tibial tendinitis right.  Plan: Discussed etiology pathology conservative versus surgical therapies. At this point I injected the right heel today with Kenalog and local anesthetic. Placed her in a orthotic device which she was fitted for today. I will follow-up with her was an orthotic comes in. A Medrol Dosepak and meloxicam were also dispenses prescriptions.

## 2017-02-08 ENCOUNTER — Other Ambulatory Visit: Payer: BLUE CROSS/BLUE SHIELD | Admitting: Orthotics

## 2017-02-13 ENCOUNTER — Ambulatory Visit: Payer: BLUE CROSS/BLUE SHIELD | Admitting: Podiatry

## 2017-02-16 DIAGNOSIS — L853 Xerosis cutis: Secondary | ICD-10-CM | POA: Diagnosis not present

## 2017-02-16 DIAGNOSIS — L820 Inflamed seborrheic keratosis: Secondary | ICD-10-CM | POA: Diagnosis not present

## 2017-02-16 DIAGNOSIS — L821 Other seborrheic keratosis: Secondary | ICD-10-CM | POA: Diagnosis not present

## 2017-02-16 DIAGNOSIS — D2261 Melanocytic nevi of right upper limb, including shoulder: Secondary | ICD-10-CM | POA: Diagnosis not present

## 2017-02-16 DIAGNOSIS — Z85828 Personal history of other malignant neoplasm of skin: Secondary | ICD-10-CM | POA: Diagnosis not present

## 2017-02-21 ENCOUNTER — Ambulatory Visit (INDEPENDENT_AMBULATORY_CARE_PROVIDER_SITE_OTHER): Payer: BLUE CROSS/BLUE SHIELD | Admitting: Family Medicine

## 2017-02-21 ENCOUNTER — Encounter: Payer: Self-pay | Admitting: Family Medicine

## 2017-02-21 VITALS — BP 104/68 | HR 68 | Temp 98.1°F | Ht 59.0 in | Wt 113.0 lb

## 2017-02-21 DIAGNOSIS — N3001 Acute cystitis with hematuria: Secondary | ICD-10-CM | POA: Diagnosis not present

## 2017-02-21 DIAGNOSIS — R319 Hematuria, unspecified: Secondary | ICD-10-CM | POA: Diagnosis not present

## 2017-02-21 DIAGNOSIS — R300 Dysuria: Secondary | ICD-10-CM | POA: Diagnosis not present

## 2017-02-21 LAB — POC URINALSYSI DIPSTICK (AUTOMATED)
Bilirubin, UA: NEGATIVE
Glucose, UA: NEGATIVE
Ketones, UA: NEGATIVE
LEUKOCYTES UA: NEGATIVE
NITRITE UA: NEGATIVE
PH UA: 6 (ref 5.0–8.0)
PROTEIN UA: NEGATIVE
Spec Grav, UA: 1.01 (ref 1.010–1.025)
UROBILINOGEN UA: 0.2 U/dL

## 2017-02-21 MED ORDER — NITROFURANTOIN MONOHYD MACRO 100 MG PO CAPS: 100.0000 mg | ORAL_CAPSULE | Freq: Two times a day (BID) | ORAL | 0 refills | Status: DC

## 2017-02-21 NOTE — Progress Notes (Signed)
Chief Complaint  Patient presents with  . Dysuria    for 3 days  . Hematuria    Kimberly Miles is a 61 y.o. female here for possible UTI.  Duration: 3 days. Symptoms: hematuria and dysuria Denies: urinary frequency, urinary hesitancy and fever, vaginal discharge Hx of recurrent UTI? No Denies new sexual partners.  ROS:  Constitutional: denies fever GU: As noted in HPI MSK: Denies back pain Abd: Denies constipation or abdominal pain  Past Medical History:  Diagnosis Date  . Anxiety   . Arthritis   . Common migraine with intractable migraine 04/06/2016  . Diverticulosis   . Elevated LFTs    monitored by pcp per pt --  unknown etiology  . Family history of premature CAD 09/01/2013  . History of basal cell carcinoma excision    2004   &  2014  . History of colitis   . History of palpitations   . 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 IBS (irritable bowel syndrome)   . Migraine   . MTHFR gene mutation ([place])   . PMB (postmenopausal bleeding)   . Post-surgical hypothyroidism    Family History  Problem Relation Age of Onset  . Hypertension Maternal Grandmother   . Uterine cancer Maternal Grandmother   . Vascular Disease Maternal Grandmother   . Lung cancer Maternal Grandmother   . Stroke Maternal Grandfather   . Prostate cancer Paternal Grandfather   . Migraines Paternal Grandmother   . Lung cancer Mother 60  . Hypertension Mother 52  . Hyperlipidemia Mother 19  . Ehlers-Danlos syndrome Mother   . Kidney disease Mother   . Fibromyalgia Mother   . Other Father        bypass surgery times 2  . Migraines Father      BP 104/68 (BP Location: Left Arm, Patient Position: Sitting, Cuff Size: Normal)   Pulse 68   Temp 98.1 F (36.7 C) (Oral)   Ht 4\' 11"  (1.499 m)   Wt 113 lb (51.3 kg)   LMP 08/25/2010   SpO2 98%   BMI 22.82 kg/m  General: Awake, alert, appears stated age 23:  MMM Heart: RRR, no murmurs Lungs: CTAB, normal respiratory effort, no accessory muscle usage Abd: BS+, soft, Mild lower abd TTP, ND, no masses or organomegaly MSK: No CVA tenderness, neg Lloyd's sign Psych: Age appropriate judgment and insight  Dysuria - Plan: POCT Urinalysis Dipstick (Automated), Urine Culture  Hematuria, unspecified type - Plan: POCT Urinalysis Dipstick (Automated), Urine Culture  Orders as above. Will tx given symptoms, will send MyChart message if culture neg.  F/u prn. The patient voiced understanding and agreement to the plan.  [place], DO 02/21/17 10:33 AM

## 2017-02-21 NOTE — Patient Instructions (Signed)
I will send you a MyChart message in 2 days letting you know whether to continue medicine or to stop.  Let us know if you need anything.

## 2017-02-21 NOTE — Progress Notes (Signed)
Pre visit review using our clinic review tool, if applicable. No additional management support is needed unless otherwise documented below in the visit note. 

## 2017-02-22 DIAGNOSIS — M542 Cervicalgia: Secondary | ICD-10-CM | POA: Diagnosis not present

## 2017-02-23 LAB — URINE CULTURE

## 2017-02-27 ENCOUNTER — Ambulatory Visit (INDEPENDENT_AMBULATORY_CARE_PROVIDER_SITE_OTHER): Payer: BLUE CROSS/BLUE SHIELD | Admitting: Obstetrics & Gynecology

## 2017-02-27 ENCOUNTER — Other Ambulatory Visit (HOSPITAL_COMMUNITY)
Admission: RE | Admit: 2017-02-27 | Discharge: 2017-02-27 | Disposition: A | Discharge status: Home / Self Care | Payer: BLUE CROSS/BLUE SHIELD | Source: Ambulatory Visit | Attending: Obstetrics & Gynecology | Admitting: Obstetrics & Gynecology

## 2017-02-27 ENCOUNTER — Encounter: Payer: Self-pay | Admitting: Obstetrics & Gynecology

## 2017-02-27 VITALS — BP 110/64 | HR 66 | Resp 14 | Ht 58.25 in | Wt 111.0 lb

## 2017-02-27 DIAGNOSIS — Z124 Encounter for screening for malignant neoplasm of cervix: Secondary | ICD-10-CM | POA: Diagnosis not present

## 2017-02-27 DIAGNOSIS — N3090 Cystitis, unspecified without hematuria: Secondary | ICD-10-CM

## 2017-02-27 DIAGNOSIS — Z01419 Encounter for gynecological examination (general) (routine) without abnormal findings: Secondary | ICD-10-CM | POA: Diagnosis not present

## 2017-02-27 DIAGNOSIS — R1031 Right lower quadrant pain: Secondary | ICD-10-CM | POA: Diagnosis not present

## 2017-02-27 DIAGNOSIS — Z0000 Encounter for general adult medical examination without abnormal findings: Secondary | ICD-10-CM | POA: Diagnosis not present

## 2017-02-27 LAB — POCT URINALYSIS DIPSTICK
BILIRUBIN UA: NEGATIVE
Glucose, UA: NEGATIVE
Ketones, UA: NEGATIVE
Leukocytes, UA: NEGATIVE
NITRITE UA: NEGATIVE
PH UA: 5 (ref 5.0–8.0)
PROTEIN UA: NEGATIVE
UROBILINOGEN UA: 0.2 U/dL

## 2017-02-27 NOTE — Progress Notes (Signed)
Patient scheduled while in office for PUS on 03/08/17 at 12:30pm with consult to follow at 1pm with Dr. Sabra Heck. Patient is agreeable to date and time.

## 2017-02-27 NOTE — Progress Notes (Signed)
61 y.o. G2P2 MarriedCaucasianF here for annual exam.  Doing well.  Followed by Dr. Jannifer Franklin for chronic migraines.  MRI in June showed chronic sinusitis.  Pt denies any symptoms.  She does not want to anyone for this or be treated for this at this time.  Having issues with plantar fasciitis.    Denies vaginal bleeding.   Had cystitis last week.  Had "atypical symptoms" for her--more spasm symptoms.  She almost always this after intercourse.      Patient's last menstrual period was 08/25/2010.          Sexually active: Yes.    The current method of family planning is tubal ligation.    Exercising: Yes.    walking 3x a week  Smoker:  no  Health Maintenance: Pap:  07/21/15 Neg   07/11/13 Neg. HR HPV:neg  History of abnormal Pap:  yes MMG:  11/15/16 Diagnostic Right BIRADS3:probably benign. F/u 6 months Colonoscopy:  03/2005 normal.  Declines repeating this.  Willing to do cologuard.   BMD:   11/03/15 Osteoporosis  TDaP:  2015 Pneumonia vaccine(s):  2015 Zostavax:   never Hep C testing: 05/04/14 Neg  Screening Labs: discuss with provider, Hb today: same, Urine today: trace RBC's (just finished treatment for UTI)   reports that she has never smoked. She has never used smokeless tobacco. She reports that she drinks alcohol. She reports that she does not use drugs.  Past Medical History:  Diagnosis Date  . Anxiety   . Arthritis   . Common migraine with intractable migraine 04/06/2016  . Diverticulosis   . Elevated LFTs    monitored by pcp per pt --  unknown etiology  . Family history of premature CAD 09/01/2013  . History of basal cell carcinoma excision    2004   &  2014  . History of colitis   . History of palpitations   . 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 IBS (irritable bowel syndrome)   . Migraine   . MTHFR gene mutation ([place])   . PMB (postmenopausal bleeding)   . Post-surgical hypothyroidism      Past Surgical History:  Procedure Laterality Date  . APPENDECTOMY  as child  . BREAST BIOPSY Left 1979   benign  . CARDIOVASCULAR STRESS TEST  09-16-2013   normal nuclear study w/ no ischemia/  normal LV function and wall motion , ef 84%  . CESAREAN SECTION  1977 and 1978   Bilateral Tubal Ligation w/ last c/s  . COLPOSCOPY  05/2009   CIN 1  . HYSTEROSCOPY W/D&C N/A 12/23/2015   Procedure: DILATATION AND CURETTAGE /HYSTEROSCOPY ;  Surgeon: Megan Salon, MD;  Location[facility];  Service: Gynecology;  Laterality: N/A;  . NEGATIVE SLEEP STUDY  2014  per pt  . OOPHORECTOMY Right 1985   ruptured cyst   . THYROID LOBECTOMY Right 03-08-2011  . TRANSTHORACIC ECHOCARDIOGRAM  05-09-2011   ef 64%/  mild MR and TR    Current Outpatient Prescriptions  Medication Sig Dispense Refill  . Cholecalciferol (VITAMIN D3) 5000 UNITS CAPS Take 1 capsule by mouth daily.    Marland Kitchen conjugated estrogens (PREMARIN) vaginal cream 1/2 gram vaginally twice weekly 30 g 1  . cyclobenzaprine (FLEXERIL) 5 MG tablet Take 1 tablet by mouth daily as needed.     . fluticasone (FLONASE) 50 MCG/ACT nasal spray Place 2 sprays into both nostrils  daily. (Patient taking differently: Place 2 sprays into both nostrils as needed. ) 16 g 1  . levothyroxine (SYNTHROID, LEVOTHROID) 75 MCG tablet TAKE 1 TABLET BY MOUTH EVERY MORNING BEFORE BREAKFAST 90 tablet 3  . Melatonin 3 MG TABS Take 1 mg by mouth at bedtime.     . naratriptan (AMERGE) 2.5 MG tablet Take 1 tablet (2.5 mg total) by mouth 2 (two) times daily as needed for migraine. 10 tablet 3  . traMADol (ULTRAM) 50 MG tablet Take 50 mg by mouth 3 (three) times daily as needed.  0  . zolpidem (AMBIEN) 5 MG tablet Take 1 tablet (5 mg total) by mouth at bedtime as needed for sleep. 30 tablet 0   No current facility-administered medications for this visit.     Family History  Problem Relation Age of Onset  . Hypertension Maternal Grandmother   . Uterine  cancer Maternal Grandmother   . Vascular Disease Maternal Grandmother   . Lung cancer Maternal Grandmother   . Stroke Maternal Grandfather   . Prostate cancer Paternal Grandfather   . Migraines Paternal Grandmother   . Lung cancer Mother 64  . Hypertension Mother 22  . Hyperlipidemia Mother 44  . Ehlers-Danlos syndrome Mother   . Kidney disease Mother   . Fibromyalgia Mother   . Other Father        bypass surgery times 2  . Migraines Father   . Osteoporosis Father     ROS:  Pertinent items are noted in HPI.  Otherwise, a comprehensive ROS was negative.  Exam:   BP 110/64 (BP Location: Right Arm, Patient Position: Sitting, Cuff Size: Normal)   Pulse 66   Resp 14   Ht 4' 10.25" (1.48 m)   Wt 111 lb (50.3 kg)   LMP 08/25/2010   BMI 23.00 kg/m      Height: 4' 10.25" (148 cm)  Ht Readings from Last 3 Encounters:  02/27/17 4' 10.25" (1.48 m)  02/21/17 4\' 11"  (1.499 m)  11/24/16 4\' 10"  (1.473 m)    General appearance: alert, cooperative and appears stated age Head: Normocephalic, without obvious abnormality, atraumatic Neck: no adenopathy, supple, symmetrical, trachea midline and thyroid normal to inspection and palpation Lungs: clear to auscultation bilaterally Breasts: normal appearance, no masses or tenderness Heart: regular rate and rhythm Abdomen: soft, non-tender; bowel sounds normal; no masses,  no organomegaly Extremities: extremities normal, atraumatic, no cyanosis or edema Skin: Skin color, texture, turgor normal. No rashes or lesions Lymph nodes: Cervical, supraclavicular, and axillary nodes normal. No abnormal inguinal nodes palpated Neurologic: Grossly normal   Pelvic: External genitalia:  no lesions              Urethra:  normal appearing urethra with no masses, tenderness or lesions              Bartholins and Skenes: normal                 Vagina: normal appearing vagina with normal color and discharge, no lesions              Cervix: no lesions               Pap taken: Yes.   Bimanual Exam:  Uterus:  normal size, contour, position, consistency, mobility, non-tender              Adnexa: normal adnexa and no mass, fullness, tenderness               Rectovaginal:  Confirms               Anus:  normal sphincter tone, no lesions  Chaperone was present for exam.  A:  Well Woman with normal exam PMP, no HRT Vaginal atrophic changes Hypothyroidism Recent cystitis RLQ pain  P:   Mammogram guidelines reviewed.  She is due follow up in November.  She will call to schedule. pap smear and HR HPV obtained today Urine culture and micro obtained today Does not need RF for premarin cream today Declines lab work today.  Reports she will call Dr. Etter Sjogren to schedule appt and lab work. Declines any additional vaccines. Declines colonoscopy.  Cologuard ordered today. Desires PUS for additional evaluation.  H/o RSO in 1985 due to ruptured cyst. Return annually or prn

## 2017-02-28 ENCOUNTER — Other Ambulatory Visit: Payer: Self-pay | Admitting: *Deleted

## 2017-02-28 DIAGNOSIS — R1031 Right lower quadrant pain: Secondary | ICD-10-CM

## 2017-02-28 LAB — URINALYSIS, MICROSCOPIC ONLY
Casts: NONE SEEN /lpf
Epithelial Cells (non renal): NONE SEEN /hpf (ref 0–10)
RBC, UA: NONE SEEN /hpf (ref 0–?)
WBC, UA: NONE SEEN /hpf (ref 0–?)

## 2017-02-28 LAB — URINE CULTURE: ORGANISM ID, BACTERIA: NO GROWTH

## 2017-03-01 LAB — CYTOLOGY - PAP: HPV (WINDOPATH): NOT DETECTED

## 2017-03-08 ENCOUNTER — Ambulatory Visit (INDEPENDENT_AMBULATORY_CARE_PROVIDER_SITE_OTHER): Payer: BLUE CROSS/BLUE SHIELD

## 2017-03-08 ENCOUNTER — Ambulatory Visit (INDEPENDENT_AMBULATORY_CARE_PROVIDER_SITE_OTHER): Payer: BLUE CROSS/BLUE SHIELD | Admitting: Obstetrics & Gynecology

## 2017-03-08 ENCOUNTER — Encounter: Payer: Self-pay | Admitting: Obstetrics & Gynecology

## 2017-03-08 VITALS — BP 108/54 | HR 64 | Resp 14 | Wt 111.0 lb

## 2017-03-08 DIAGNOSIS — R1031 Right lower quadrant pain: Secondary | ICD-10-CM

## 2017-03-08 NOTE — Progress Notes (Signed)
61 y.o. G2P2 Married Caucasian female here for pelvic ultrasound due to RLQ pain.  She has declined colonoscopy for evaluation and cologuard has been ordered.  This has not bee completed at this time.  Denies vaginal bleeding or discharge.  Patient's last menstrual period was 08/25/2010.  Contraception: PMP  Findings:  UTERUS: 5.0 x 3.9 x 3.0cm EMS: 1.34mm ADNEXA: Left ovary: 1.0 x 0.9 x 0.5cm       Right ovary: surgically absent CUL DE SAC: no free fluid  Discussion:  Findings reviewed with pt.  D/w pt colonoscopy again but she declines.  States she hasn't received the cologuard yet.  This will be refaxed and she knows to call in one week if she has not received this at that time.  Questions answered..  Assessment:  RLQ pain H/O RSO Otherwise normal PUS  Plan:  cologuard will be done.  If pain persists, pt does know that I really encourage her to consider proceeding with colonoscopy.  ~15 minutes spent with patient >50% of time was in face to face discussion of above.

## 2017-03-22 ENCOUNTER — Encounter: Payer: Self-pay | Admitting: Medical

## 2017-03-22 ENCOUNTER — Ambulatory Visit (INDEPENDENT_AMBULATORY_CARE_PROVIDER_SITE_OTHER): Payer: BLUE CROSS/BLUE SHIELD | Admitting: Medical

## 2017-03-22 VITALS — BP 106/69 | HR 68 | Temp 98.2°F | Resp 16 | Ht 59.0 in | Wt 113.0 lb

## 2017-03-22 DIAGNOSIS — R002 Palpitations: Secondary | ICD-10-CM | POA: Diagnosis not present

## 2017-03-22 DIAGNOSIS — R5383 Other fatigue: Secondary | ICD-10-CM

## 2017-03-22 DIAGNOSIS — E039 Hypothyroidism, unspecified: Secondary | ICD-10-CM

## 2017-03-22 LAB — CBC WITH DIFFERENTIAL/PLATELET
BASOS PCT: 0.5 % (ref 0.0–3.0)
Basophils Absolute: 0 10*3/uL (ref 0.0–0.1)
EOS PCT: 0.7 % (ref 0.0–5.0)
Eosinophils Absolute: 0 10*3/uL (ref 0.0–0.7)
HEMATOCRIT: 43.3 % (ref 36.0–46.0)
Hemoglobin: 14.4 g/dL (ref 12.0–15.0)
LYMPHS PCT: 25.7 % (ref 12.0–46.0)
Lymphs Abs: 1.5 10*3/uL (ref 0.7–4.0)
MCHC: 33.2 g/dL (ref 30.0–36.0)
MCV: 97.8 fl (ref 78.0–100.0)
MONOS PCT: 6.2 % (ref 3.0–12.0)
Monocytes Absolute: 0.4 10*3/uL (ref 0.1–1.0)
NEUTROS ABS: 4 10*3/uL (ref 1.4–7.7)
Neutrophils Relative %: 66.9 % (ref 43.0–77.0)
Platelets: 210 10*3/uL (ref 150.0–400.0)
RBC: 4.43 Mil/uL (ref 3.87–5.11)
RDW: 12.8 % (ref 11.5–15.5)
WBC: 5.9 10*3/uL (ref 4.0–10.5)

## 2017-03-22 LAB — COMPREHENSIVE METABOLIC PANEL
ALBUMIN: 4 g/dL (ref 3.5–5.2)
ALK PHOS: 72 U/L (ref 39–117)
ALT: 47 U/L — ABNORMAL HIGH (ref 0–35)
AST: 28 U/L (ref 0–37)
BUN: 10 mg/dL (ref 6–23)
CALCIUM: 8.9 mg/dL (ref 8.4–10.5)
CHLORIDE: 105 meq/L (ref 96–112)
CO2: 30 mEq/L (ref 19–32)
Creatinine, Ser: 0.69 mg/dL (ref 0.40–1.20)
GFR: 91.16 mL/min (ref >60.00)
Glucose, Bld: 97 mg/dL (ref 70–99)
Potassium: 4.1 mEq/L (ref 3.5–5.1)
Sodium: 140 mEq/L (ref 135–145)
Total Bilirubin: 0.4 mg/dL (ref 0.2–1.2)
Total Protein: 6.1 g/dL (ref 6.0–8.3)

## 2017-03-22 LAB — VITAMIN B12: VITAMIN B 12: 498 pg/mL (ref 211–911)

## 2017-03-22 NOTE — Patient Instructions (Addendum)
For your recent fatigue will get CBC, CMP, TSH, thyroid panel, B12 and B1 level. Also include vitamin D. We will let you know the results of those when they are in.  For your history of palpitations and recent slight palpitations, I do think it would be a good idea to refer you back to your former cardiologist. They might want to do another Holter monitor or consider medications if palpitations are frequent.  If you have any severe increase in stress or your mood is being effective a stress please let us know.  Follow-up date to be determined after lab review.  Your EKG today did not show any arrhythmia or palpitation.

## 2017-03-22 NOTE — Progress Notes (Signed)
Subjective:    Patient ID: Kimberly Miles, female    DOB: 03-19-1956, 61 y.o.   MRN: [id number]  HPI   Pt in for evaluation.  She wants to follow up on her thyroid function. She wants to make sure she is on right dose.  May thyroid studies normal. On 75 mcg synthroid.   Feeling of tinge unit buzzing sensation in her body. Feels wired and shaky. Like she had a lot of caffeine. Pt states years back when she first got on thyroid medication she felt like this. At beginning was taking too much thyroid med. In past for a while was on armour thyroid.  Also feels fatigued. Not sleeping that well recently. Some stress recently indicated some marital stress.  Note regarding patient's slight palpitation yesterday that on review of prior Holter she does have history of Holter that reported bigeminy trigeminy and quadreminy. Patient is not on a beta blocker. Last 3 days she did stop caffeine.   On discussion palpitation occurred 3 times past week and each event lasted 3-5 seconds.    Review of Systems  Constitutional: Positive for fatigue. Negative for chills and fever.  Respiratory: Negative for cough, chest tightness, shortness of breath and wheezing.        Prior cxr looked good. No lung disease.  Cardiovascular: Negative for chest pain and palpitations.       States random rare and occasional palpitation for one or two seconds.   Yesterday had brief palpitation. Pt has stopped coffe for past 2-3 days. When does drink it is only 1 cup a day.  Gastrointestinal: Negative for abdominal pain.  Musculoskeletal: Negative for back pain and gait problem.  Neurological: Negative for dizziness, syncope, weakness and headaches.  Hematological: Negative for adenopathy. Does not bruise/bleed easily.  Psychiatric/Behavioral: Negative for behavioral problems, confusion, self-injury, sleep disturbance and suicidal ideas.    Past Medical History:  Diagnosis Date  . Anxiety   . Arthritis   .  Common migraine with intractable migraine 04/06/2016  . Diverticulosis   . Elevated LFTs    monitored by pcp per pt --  unknown etiology  . Family history of premature CAD 09/01/2013  . History of basal cell carcinoma excision    2004   &  2014  . History of colitis   . History of palpitations   . 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 IBS (irritable bowel syndrome)   . Migraine   . MTHFR gene mutation ([place])   . PMB (postmenopausal bleeding)   . Post-surgical hypothyroidism      Social History   Social History  . Marital status: Married    Spouse name: N/A  . Number of children: 2  . Years of education: 12   Occupational History  . Retired    Social History Main Topics  . Smoking status: Never Smoker  . Smokeless tobacco: Never Used  . Alcohol use 0.0 oz/week  . Drug use: No  . Sexual activity: Yes    Partners: Male    Birth control/ protection: Post-menopausal     Comment: BTL done w/ last C/S   Other Topics Concern  . Not on file   Social History Narrative   Lives at home w/ her husband   Right-handed   Caffeine: 1/4 cup per day    Past Surgical History:  Procedure Laterality Date  . APPENDECTOMY  as  child  . BREAST BIOPSY Left 1979   benign  . CARDIOVASCULAR STRESS TEST  09-16-2013   normal nuclear study w/ no ischemia/  normal LV function and wall motion , ef 84%  . CESAREAN SECTION  1977 and 1978   Bilateral Tubal Ligation w/ last c/s  . COLPOSCOPY  05/2009   CIN 1  . HYSTEROSCOPY W/D&C N/A 12/23/2015   Procedure: DILATATION AND CURETTAGE /HYSTEROSCOPY ;  Surgeon: Megan Salon, MD;  Location: [facility];  Service: Gynecology;  Laterality: N/A;  . NEGATIVE SLEEP STUDY  2014  per pt  . OOPHORECTOMY Right 1985   ruptured cyst   . THYROID LOBECTOMY Right 03-08-2011  . TRANSTHORACIC ECHOCARDIOGRAM  05-09-2011   ef 64%/  mild MR and TR    Family History    Problem Relation Age of Onset  . Hypertension Maternal Grandmother   . Uterine cancer Maternal Grandmother   . Vascular Disease Maternal Grandmother   . Lung cancer Maternal Grandmother   . Stroke Maternal Grandfather   . Prostate cancer Paternal Grandfather   . Migraines Paternal Grandmother   . Lung cancer Mother 30  . Hypertension Mother 80  . Hyperlipidemia Mother 56  . Ehlers-Danlos syndrome Mother   . Kidney disease Mother   . Fibromyalgia Mother   . Other Father        bypass surgery times 2  . Migraines Father   . Osteoporosis Father     Allergies  Allergen Reactions  . Codeine Nausea Only  . Demerol [Meperidine] Nausea And Vomiting  . Erythromycin Other (See Comments)    Stomach upset  . Sulfa Antibiotics Other (See Comments)    unknown reaction  . Ampicillin Rash    Current Outpatient Prescriptions on File Prior to Visit  Medication Sig Dispense Refill  . Cholecalciferol (VITAMIN D3) 5000 UNITS CAPS Take 1 capsule by mouth daily.    Marland Kitchen conjugated estrogens (PREMARIN) vaginal cream 1/2 gram vaginally twice weekly 30 g 1  . cyclobenzaprine (FLEXERIL) 5 MG tablet Take 1 tablet by mouth daily as needed.     Marland Kitchen levothyroxine (SYNTHROID, LEVOTHROID) 75 MCG tablet TAKE 1 TABLET BY MOUTH EVERY MORNING BEFORE BREAKFAST 90 tablet 3  . Melatonin 3 MG TABS Take 1 mg by mouth at bedtime.     . naratriptan (AMERGE) 2.5 MG tablet Take 1 tablet (2.5 mg total) by mouth 2 (two) times daily as needed for migraine. 10 tablet 3  . traMADol (ULTRAM) 50 MG tablet Take 50 mg by mouth 3 (three) times daily as needed.  0  . zolpidem (AMBIEN) 5 MG tablet Take 1 tablet (5 mg total) by mouth at bedtime as needed for sleep. 30 tablet 0   No current facility-administered medications on file prior to visit.     BP 106/69   Pulse 68   Temp 98.2 F (36.8 C) (Oral)   Resp 16   Ht 4\' 11"  (1.499 m)   Wt 113 lb (51.3 kg)   LMP 08/25/2010   SpO2 100%   BMI 22.82 kg/m       Objective:    Physical Exam  General Mental Status- Alert. General Appearance- Not in acute distress.   Skin General: Color- Normal Color. Moisture- Normal Moisture.  Neck Carotid Arteries- Normal color. Moisture- Normal Moisture. No carotid bruits. No JVD.  Chest and Lung Exam Auscultation: Breath Sounds:-Normal.  Cardiovascular Auscultation:Rythm- Regular. Murmurs & Other Heart Sounds:Auscultation of the heart reveals- No Murmurs.  Abdomen Inspection:-Inspeection  Normal. Palpation/Percussion:Note:No mass. Palpation and Percussion of the abdomen reveal- Non Tender, Non Distended + BS, no rebound or guarding.    Neurologic Cranial Nerve exam:- CN III-XII intact(No nystagmus), symmetric smile. Strength:- 5/5 equal and symmetric strength both upper and lower extremities.      Assessment & Plan:  For your recent fatigue will get CBC, CMP, TSH, thyroid panel, B12 and B1 level. Also include vitamin D. We will let you know the results of those when they are in.  For your history of palpitations and recent slight palpitations, I do think it would be a good idea to refer you back to your former cardiologist. They might want to do another Holter monitor or consider medications if palpitations are frequent.  If you have any severe increase in stress or your mood is being effective a stress please let us know.  Follow-up date to be determined after lab review.  Your EKG showed sinus rhythm. No PVC or arrhythmias seen. The EKG machine read low voltage pulmonary disease. I reviewed patient's chest x-ray report and it looked normal. Patient did not report any cough wheezing or shortness of breath.  On discussion about referral to cardiologist for possible Holter, she declined referral today. He does state that if she had recurrent more frequent palpitations she would let me know and I'll refer back to her former cardiologist.   Mackie Pai, PA-C

## 2017-03-26 ENCOUNTER — Encounter: Payer: Self-pay | Admitting: Medical

## 2017-03-26 MED ORDER — LEVOTHYROXINE SODIUM 75 MCG PO TABS: 75.0000 ug | ORAL_TABLET | Freq: Every day | ORAL | 1 refills | Status: DC

## 2017-03-27 LAB — VITAMIN D 1,25 DIHYDROXY
VITAMIN D3 1, 25 (OH): 48 pg/mL
Vitamin D 1, 25 (OH)2 Total: 48 pg/mL (ref 18–72)
Vitamin D2 1, 25 (OH)2: <8 pg/mL

## 2017-03-27 LAB — THYROID PANEL WITH TSH
Free Thyroxine Index: 2.9 (ref 1.4–3.8)
T3 Uptake: 28 % (ref 22–35)
T4 TOTAL: 10.4 ug/dL (ref 5.1–11.9)
TSH: 1.08 mIU/L (ref 0.40–4.50)

## 2017-03-27 LAB — VITAMIN B1: Vitamin B1 (Thiamine): 25 nmol/L (ref 8–30)

## 2017-04-13 DIAGNOSIS — Z1212 Encounter for screening for malignant neoplasm of rectum: Secondary | ICD-10-CM | POA: Diagnosis not present

## 2017-04-13 DIAGNOSIS — Z1211 Encounter for screening for malignant neoplasm of colon: Secondary | ICD-10-CM | POA: Diagnosis not present

## 2017-04-13 LAB — COLOGUARD: Cologuard: NEGATIVE

## 2017-04-23 ENCOUNTER — Ambulatory Visit: Payer: BLUE CROSS/BLUE SHIELD | Admitting: Neurology

## 2017-04-26 ENCOUNTER — Telehealth: Payer: Self-pay | Admitting: *Deleted

## 2017-04-26 ENCOUNTER — Other Ambulatory Visit: Payer: Self-pay | Admitting: *Deleted

## 2017-04-26 DIAGNOSIS — Z1211 Encounter for screening for malignant neoplasm of colon: Secondary | ICD-10-CM

## 2017-04-26 NOTE — Telephone Encounter (Signed)
Patient notified of negative cologuard results and verbalized understanding.   Patient agreeable to disposition. Will close encounter.

## 2017-05-01 ENCOUNTER — Encounter: Payer: Self-pay | Admitting: Obstetrics & Gynecology

## 2017-05-25 DIAGNOSIS — M542 Cervicalgia: Secondary | ICD-10-CM | POA: Diagnosis not present

## 2017-06-27 ENCOUNTER — Telehealth: Payer: Self-pay | Admitting: *Deleted

## 2017-06-27 NOTE — Telephone Encounter (Signed)
[employer] Spoke with Kimberly Miles. She states patient had appt 05/24/17 but canceled and has not called back to reschedule. Has appt for BMD and screening MMG 11/16/17.  Called patient and states she has questions about diagnostic MMG. States she prefers to do [place] instead but radiologist wouldn't do one without Dr. Ammie Ferrier approval. Has other questions about diagnostic MMG vs. [place]  Made appt for patient for 07/12/17 @2 :45pm with Dr. Sabra Heck to go over questions and concerns.   Dr. Lestine Box Routed to [facility].

## 2017-06-27 NOTE — Telephone Encounter (Signed)
Patient in 04 recall for 04/2017. Patient needs F/U imaging. Please contact patient regarding scheduling -eh

## 2017-07-12 ENCOUNTER — Ambulatory Visit: Payer: BLUE CROSS/BLUE SHIELD | Admitting: Obstetrics & Gynecology

## 2017-07-17 NOTE — Telephone Encounter (Signed)
Spoke with [facility] who states they prefer the patient has both diagnostic imaging and an ultrasound performed, but if the patient refuses a diagnostic ultrasound only can be performed if Dr.Miller agrees. Spoke with patient who states that she has decided to proceed with diagnostic mammogram and ultrasound and she will call to schedule. Offered to assist patient with scheduling, but patient declines.

## 2017-07-17 NOTE — Telephone Encounter (Signed)
Pt did not come for appt.  Can you call [facility] and see if the radiologist would just do diagnostic ultrasound on this pt for follow up?  Not sure if they can see the calcification on ultrasound?  Thanks.

## 2017-07-30 DIAGNOSIS — R922 Inconclusive mammogram: Secondary | ICD-10-CM | POA: Diagnosis not present

## 2017-07-30 DIAGNOSIS — R921 Mammographic calcification found on diagnostic imaging of breast: Secondary | ICD-10-CM | POA: Diagnosis not present

## 2017-08-20 ENCOUNTER — Encounter: Payer: Self-pay | Admitting: Obstetrics & Gynecology

## 2017-08-23 DIAGNOSIS — M542 Cervicalgia: Secondary | ICD-10-CM | POA: Diagnosis not present

## 2017-09-03 DIAGNOSIS — H01003 Unspecified blepharitis right eye, unspecified eyelid: Secondary | ICD-10-CM | POA: Diagnosis not present

## 2017-09-03 DIAGNOSIS — H2513 Age-related nuclear cataract, bilateral: Secondary | ICD-10-CM | POA: Diagnosis not present

## 2017-09-03 DIAGNOSIS — H25013 Cortical age-related cataract, bilateral: Secondary | ICD-10-CM | POA: Diagnosis not present

## 2017-09-03 DIAGNOSIS — H524 Presbyopia: Secondary | ICD-10-CM | POA: Diagnosis not present

## 2017-09-03 DIAGNOSIS — D3132 Benign neoplasm of left choroid: Secondary | ICD-10-CM | POA: Diagnosis not present

## 2017-09-22 ENCOUNTER — Telehealth: Payer: Self-pay | Admitting: Medical

## 2017-09-25 NOTE — Telephone Encounter (Signed)
Pt is due for appointment please call and schedule appointment.

## 2017-09-25 NOTE — Telephone Encounter (Signed)
Pt wants to know why she needs to come in if she has a 90 day supply. She asking that you call her back with more details as to why she needs a visit. She thought she would see him on an annual basis.

## 2017-09-25 NOTE — Telephone Encounter (Signed)
Pt scheduled tomorrow at 8:45am

## 2017-09-26 ENCOUNTER — Ambulatory Visit: Payer: BLUE CROSS/BLUE SHIELD | Admitting: Medical

## 2017-09-26 ENCOUNTER — Ambulatory Visit (HOSPITAL_BASED_OUTPATIENT_CLINIC_OR_DEPARTMENT_OTHER)
Admission: RE | Admit: 2017-09-26 | Discharge: 2017-09-26 | Disposition: A | Discharge status: Home / Self Care | Payer: BLUE CROSS/BLUE SHIELD | Source: Ambulatory Visit | Attending: Medical | Admitting: Medical

## 2017-09-26 ENCOUNTER — Encounter: Payer: Self-pay | Admitting: Medical

## 2017-09-26 ENCOUNTER — Other Ambulatory Visit: Payer: Self-pay | Admitting: Medical

## 2017-09-26 VITALS — BP 121/63 | HR 68 | Temp 97.5°F | Resp 16 | Ht 59.0 in | Wt 105.4 lb

## 2017-09-26 DIAGNOSIS — Z9889 Other specified postprocedural states: Secondary | ICD-10-CM | POA: Diagnosis not present

## 2017-09-26 DIAGNOSIS — M8580 Other specified disorders of bone density and structure, unspecified site: Secondary | ICD-10-CM

## 2017-09-26 DIAGNOSIS — G43809 Other migraine, not intractable, without status migrainosus: Secondary | ICD-10-CM | POA: Diagnosis not present

## 2017-09-26 DIAGNOSIS — R221 Localized swelling, mass and lump, neck: Secondary | ICD-10-CM

## 2017-09-26 DIAGNOSIS — E042 Nontoxic multinodular goiter: Secondary | ICD-10-CM | POA: Diagnosis not present

## 2017-09-26 DIAGNOSIS — E039 Hypothyroidism, unspecified: Secondary | ICD-10-CM | POA: Diagnosis not present

## 2017-09-26 DIAGNOSIS — E785 Hyperlipidemia, unspecified: Secondary | ICD-10-CM | POA: Diagnosis not present

## 2017-09-26 LAB — LIPID PANEL
CHOL/HDL RATIO: 3
Cholesterol: 246 mg/dL — ABNORMAL HIGH (ref 0–200)
HDL: 79.1 mg/dL (ref >39.00)
LDL Cholesterol: 153 mg/dL — ABNORMAL HIGH (ref 0–99)
NonHDL: 167.27
TRIGLYCERIDES: 69 mg/dL (ref 0.0–149.0)
VLDL: 13.8 mg/dL (ref 0.0–40.0)

## 2017-09-26 LAB — T4, FREE: FREE T4: 0.88 ng/dL (ref 0.60–1.60)

## 2017-09-26 LAB — TSH: TSH: 2.21 u[IU]/mL (ref 0.35–4.50)

## 2017-09-26 LAB — COMPREHENSIVE METABOLIC PANEL
ALK PHOS: 86 U/L (ref 39–117)
ALT: 37 U/L — AB (ref 0–35)
AST: 24 U/L (ref 0–37)
Albumin: 4.1 g/dL (ref 3.5–5.2)
BUN: 12 mg/dL (ref 6–23)
CALCIUM: 9.2 mg/dL (ref 8.4–10.5)
CO2: 28 meq/L (ref 19–32)
CREATININE: 0.55 mg/dL (ref 0.40–1.20)
Chloride: 101 mEq/L (ref 96–112)
GFR: 118.24 mL/min (ref >60.00)
Glucose, Bld: 77 mg/dL (ref 70–99)
Potassium: 4.3 mEq/L (ref 3.5–5.1)
Sodium: 138 mEq/L (ref 135–145)
Total Bilirubin: 0.5 mg/dL (ref 0.2–1.2)
Total Protein: 6.6 g/dL (ref 6.0–8.3)

## 2017-09-26 LAB — VITAMIN D 25 HYDROXY (VIT D DEFICIENCY, FRACTURES): VITD: 63.93 ng/mL (ref 30.00–100.00)

## 2017-09-26 MED ORDER — BUTALBITAL-ASPIRIN-CAFFEINE 50-325-40 MG PO CAPS: 1.0000 | ORAL_CAPSULE | Freq: Four times a day (QID) | ORAL | 0 refills | Status: DC | PRN

## 2017-09-26 MED ORDER — ONDANSETRON 4 MG PO TBDP: 4.0000 mg | ORAL_TABLET | Freq: Three times a day (TID) | ORAL | 0 refills | Status: DC | PRN

## 2017-09-26 MED ORDER — NARATRIPTAN HCL 2.5 MG PO TABS: 2.5000 mg | ORAL_TABLET | Freq: Two times a day (BID) | ORAL | 3 refills | Status: DC | PRN

## 2017-09-26 NOTE — Patient Instructions (Addendum)
For your history of migraines, I did refill your Amerge.  Also I am making Zofran available for nausea.  In addition you do mention that sometimes the following day after using Amerge headache sometimes reoccurs.  In that event I am making Fiorinal available.  Please keep in mind also that if headache is persisting for long duration as sometimes you report then we could see you in the office and give you Toradol injection.  For possible small lump/fullness to the left side thyroid area, I did place order for soft tissue neck ultrasound.  For history of hyperlipidemia, I placed metabolic panel and lipid panel today.  Also with those labs including TSH and T4.  With your history of osteopenia, I placed vitamin D order.  Follow-up date to be determined after lab review.  Note also you mention you did not plan to follow-up with your neurologist.  Will need to follow you closely and keep [place] updated on how you respond to additional medications for migraines.  Sometimes with him headache history if features of headache change then we may need to do imaging studies or refer you back to your neurologist.

## 2017-09-26 NOTE — Progress Notes (Signed)
i

## 2017-09-26 NOTE — Progress Notes (Signed)
Subjective:    Patient ID: Kimberly Miles, female    DOB: [birth year], 62 y.o.   MRN: [id number]  HPI  Pt told by someone in office she needed repeat test for thyroid.  Pt states also needs to refill of her migraine medications. About 2 migraine ha a month. Takes amerge. Pt missed appointment in October. She in past was offered gabapentin but declined.  Pt did not expect to have to come in so she wants to make most of visit and get routine labs.  Pt does have history of high cholesterol  Pt states on left side of neck. She states when she tilts her head back or turns feels like maybe mass or lump pushing against trachea. Pt had part of her thyroid removed in past. She not sure which side was remove.    Review of Systems  Constitutional: Negative for chills, fatigue and fever.  Respiratory: Negative for cough, chest tightness, shortness of breath and wheezing.   Cardiovascular: Negative for chest pain and palpitations.  Gastrointestinal: Negative for abdominal pain, nausea and vomiting.  Genitourinary: Negative for difficulty urinating, dysuria and frequency.  Musculoskeletal: Negative for back pain.       See hpi and neck exam.  Skin: Negative for color change and rash.  Neurological: Negative for dizziness, speech difficulty, weakness and light-headedness.  Hematological: Negative for adenopathy. Does not bruise/bleed easily.  Psychiatric/Behavioral: Negative for behavioral problems, decreased concentration, dysphoric mood and self-injury. The patient is not hyperactive.     Past Medical History:  Diagnosis Date  . Anxiety   . Arthritis   . Common migraine with intractable migraine 04/06/2016  . Diverticulosis   . Elevated LFTs    monitored by pcp per pt --  unknown etiology  . Family history of premature CAD 09/01/2013  . History of basal cell carcinoma excision    2004   &  2014  . History of colitis   . History of palpitations   . 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 IBS (irritable bowel syndrome)   . Migraine   . MTHFR gene mutation ([place])   . PMB (postmenopausal bleeding)   . Post-surgical hypothyroidism      Social History   Socioeconomic History  . Marital status: Married    Spouse name: Not on file  . Number of children: 2  . Years of education: 35  . Highest education level: Not on file  Occupational History  . Occupation: Retired  Scientific laboratory technician  . Financial resource strain: Not on file  . Food insecurity:    Worry: Not on file    Inability: Not on file  . Transportation needs:    Medical: Not on file    Non-medical: Not on file  Tobacco Use  . Smoking status: Never Smoker  . Smokeless tobacco: Never Used  Substance and Sexual Activity  . Alcohol use: Yes    Alcohol/week: 0.0 oz  . Drug use: No  . Sexual activity: Yes    Partners: Male    Birth control/protection: Post-menopausal    Comment: BTL done w/ last C/S  Lifestyle  . Physical activity:    Days per week: Not on file    Minutes per session: Not on file  . Stress: Not on file  Relationships  . Social connections:    Talks on phone: Not on file    Gets together: Not  on file    Attends religious service: Not on file    Active member of club or organization: Not on file    Attends meetings of clubs or organizations: Not on file    Relationship status: Not on file  . Intimate partner violence:    Fear of current or ex partner: Not on file    Emotionally abused: Not on file    Physically abused: Not on file    Forced sexual activity: Not on file  Other Topics Concern  . Not on file  Social History Narrative   Lives at home w/ her husband   Right-handed   Caffeine: 1/4 cup per day    Past Surgical History:  Procedure Laterality Date  . APPENDECTOMY  as child  . BREAST BIOPSY Left 1979   benign  . CARDIOVASCULAR STRESS TEST  09-16-2013   normal nuclear study w/ no ischemia/   normal LV function and wall motion , ef 84%  . CESAREAN SECTION  1977 and 1978   Bilateral Tubal Ligation w/ last c/s  . COLPOSCOPY  05/2009   CIN 1  . HYSTEROSCOPY W/D&C N/A 12/23/2015   Procedure: DILATATION AND CURETTAGE /HYSTEROSCOPY ;  Surgeon: Megan Salon, MD;  Location: [facility];  Service: Gynecology;  Laterality: N/A;  . NEGATIVE SLEEP STUDY  2014  per pt  . OOPHORECTOMY Right 1985   ruptured cyst   . THYROID LOBECTOMY Right 03-08-2011  . TRANSTHORACIC ECHOCARDIOGRAM  05-09-2011   ef 64%/  mild MR and TR    Family History  Problem Relation Age of Onset  . Hypertension Maternal Grandmother   . Uterine cancer Maternal Grandmother   . Vascular Disease Maternal Grandmother   . Lung cancer Maternal Grandmother   . Stroke Maternal Grandfather   . Prostate cancer Paternal Grandfather   . Migraines Paternal Grandmother   . Lung cancer Mother 66  . Hypertension Mother 54  . Hyperlipidemia Mother 61  . Ehlers-Danlos syndrome Mother   . Kidney disease Mother   . Fibromyalgia Mother   . Other Father        bypass surgery times 2  . Migraines Father   . Osteoporosis Father     Allergies  Allergen Reactions  . Codeine Nausea Only  . Demerol [Meperidine] Nausea And Vomiting  . Erythromycin Other (See Comments)    Stomach upset  . Sulfa Antibiotics Other (See Comments)    unknown reaction  . Ampicillin Rash    Current Outpatient Medications on File Prior to Visit  Medication Sig Dispense Refill  . Cholecalciferol (VITAMIN D3) 5000 UNITS CAPS Take 1 capsule by mouth daily.    . cyclobenzaprine (FLEXERIL) 5 MG tablet Take 1 tablet by mouth daily as needed.     Marland Kitchen levothyroxine (SYNTHROID, LEVOTHROID) 75 MCG tablet TAKE 1 TABLET (75 MCG TOTAL) BY MOUTH DAILY BEFORE BREAKFAST. 90 tablet 1  . Melatonin 3 MG TABS Take 1 mg by mouth at bedtime.     . naratriptan (AMERGE) 2.5 MG tablet Take 1 tablet (2.5 mg total) by mouth 2 (two) times daily as needed for  migraine. 10 tablet 3  . traMADol (ULTRAM) 50 MG tablet Take 50 mg by mouth 3 (three) times daily as needed.  0  . zolpidem (AMBIEN) 5 MG tablet Take 1 tablet (5 mg total) by mouth at bedtime as needed for sleep. 30 tablet 0  . conjugated estrogens (PREMARIN) vaginal cream 1/2 gram vaginally twice weekly (Patient not taking:  Reported on 09/26/2017) 30 g 1   No current facility-administered medications on file prior to visit.     BP 121/63   Pulse 68   Temp (!) 97.5 F (36.4 C) (Oral)   Resp 16   Ht 4\' 11"  (1.499 m)   Wt 105 lb 6.4 oz (47.8 kg)   LMP 08/25/2010   SpO2 100%   BMI 21.29 kg/m       Objective:   Physical Exam  General Mental Status- Alert. General Appearance- Not in acute distress.   Skin General: Color- Normal Color. Moisture- Normal Moisture.  Neck Carotid Arteries- Normal color. Moisture- Normal Moisture. No carotid bruits. No JVD. Left side of neck. Level of thyroid mild fullness. Maybe small lump.  Chest and Lung Exam Auscultation: Breath Sounds:-Normal.  Cardiovascular Auscultation:Rythm- Regular. Murmurs & Other Heart Sounds:Auscultation of the heart reveals- No Murmurs.  Abdomen Inspection:-Inspeection Normal. Palpation/Percussion:Note:No mass. Palpation and Percussion of the abdomen reveal- Non Tender, Non Distended + BS, no rebound or guarding.    Neurologic Cranial Nerve exam:- CN III-XII intact(No nystagmus), symmetric smile. Drift Test:- No drift. Romberg Exam:- Negative.  Heal to Toe Gait exam:-Normal. Finger to Nose:- Normal/Intact Strength:- 5/5 equal and symmetric strength both upper and lower extremities.      Assessment & Plan:  For your history of migraines, I did refill your Amerge.  Also I am making Zofran available for nausea.  In addition you do mention that sometimes the following day after using Amerge headache sometimes reoccurs.  In that event I am making Fiorinal available.  Please keep in mind also that if headache is  persisting for long duration as sometimes you report then we could see you in the office and give you Toradol injection.  For possible small lump/fullness to the left side thyroid area, I did place order for soft tissue neck ultrasound.  For history of hyperlipidemia, I placed metabolic panel and lipid panel today.  Also with those labs including TSH and T4.  With your history of osteopenia, I placed vitamin D order.  Follow-up date to be determined after lab review.  Note also you mention you did not plan to follow-up with your neurologist.  Will need to follow you closely and keep [place] updated on how you respond to additional medications for migraines.  Sometimes with him headache history if features of headache change then we may need to do imaging studies or refer you back to your neurologist.  Mackie Pai, PA-C

## 2017-10-03 ENCOUNTER — Ambulatory Visit: Payer: BLUE CROSS/BLUE SHIELD | Admitting: Family Medicine

## 2017-10-03 ENCOUNTER — Encounter: Payer: Self-pay | Admitting: Family Medicine

## 2017-10-03 VITALS — BP 104/62 | HR 98 | Temp 98.0°F | Ht 59.0 in | Wt 105.2 lb

## 2017-10-03 DIAGNOSIS — B029 Zoster without complications: Secondary | ICD-10-CM | POA: Diagnosis not present

## 2017-10-03 MED ORDER — VALACYCLOVIR HCL 500 MG PO TABS: 1000.0000 mg | ORAL_TABLET | Freq: Three times a day (TID) | ORAL | 0 refills | Status: DC

## 2017-10-03 NOTE — Progress Notes (Signed)
Pre visit review using our clinic review tool, if applicable. No additional management support is needed unless otherwise documented below in the visit note. 

## 2017-10-03 NOTE — Progress Notes (Signed)
Chief Complaint  Patient presents with  . bumps under right arm    Kimberly Miles is a 62 y.o. female here for a skin complaint.  Duration: 4 days Location: R axillae area Pruritic? Yes Painful? Yes Drainage? No New soaps/lotions/topicals/detergents? No Other associated symptoms: Radiation of sensation (difficult to describe) down R arm Therapies tried thus far: Colloidal silver, Lavendar  ROS:  Const: No fevers Skin: As noted in HPI  Past Medical History:  Diagnosis Date  . Anxiety   . Arthritis   . Common migraine with intractable migraine 04/06/2016  . Diverticulosis   . Elevated LFTs    monitored by pcp per pt --  unknown etiology  . Family history of premature CAD 09/01/2013  . History of basal cell carcinoma excision    2004   &  2014  . History of colitis   . History of palpitations   . 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 IBS (irritable bowel syndrome)   . Migraine   . MTHFR gene mutation ([place])   . PMB (postmenopausal bleeding)   . Post-surgical hypothyroidism    BP 104/62 (BP Location: Left Arm, Patient Position: Sitting, Cuff Size: Normal)   Pulse 98   Temp 98 F (36.7 C) (Oral)   Ht 4\' 11"  (1.499 m)   Wt 105 lb 4 oz (47.7 kg)   LMP 08/25/2010   SpO2 98%   BMI 21.26 kg/m  Gen: awake, alert, appearing stated age Lungs: No accessory muscle use Skin: See below. No drainage, fluctuance, excoriation Psych: Age appropriate judgment and insight        Herpes zoster without complication - Plan: valACYclovir (VALTREX) 500 MG tablet  Orders as above. 1 g tid for 7 d. Neurontin tid for pain, also could try Tylenol or ibuprofen. F/u prn. The patient voiced understanding and agreement to the plan.  [place], DO 10/03/17 4:40 PM

## 2017-10-03 NOTE — Patient Instructions (Addendum)
Low FODMAP approach may be helpful for your dietary clean up.  Tylenol and ibuprofen can also be helpful.   Gabapentin 300-400 mg every 8 hours can be helpful for symptoms.  Take anti viral medicine for a total of 7 days.  Let us know if you need anything.

## 2017-10-09 DIAGNOSIS — N390 Urinary tract infection, site not specified: Secondary | ICD-10-CM | POA: Diagnosis not present

## 2017-11-09 ENCOUNTER — Encounter: Payer: Self-pay | Admitting: Family Medicine

## 2017-11-09 NOTE — Progress Notes (Signed)
See eye exam scanned in to chart. Eye exam 09/03/2017.

## 2017-11-16 DIAGNOSIS — Z1231 Encounter for screening mammogram for malignant neoplasm of breast: Secondary | ICD-10-CM | POA: Diagnosis not present

## 2017-11-16 DIAGNOSIS — M8588 Other specified disorders of bone density and structure, other site: Secondary | ICD-10-CM | POA: Diagnosis not present

## 2017-11-16 DIAGNOSIS — M810 Age-related osteoporosis without current pathological fracture: Secondary | ICD-10-CM | POA: Diagnosis not present

## 2017-11-22 DIAGNOSIS — M542 Cervicalgia: Secondary | ICD-10-CM | POA: Diagnosis not present

## 2017-12-04 ENCOUNTER — Encounter: Payer: Self-pay | Admitting: Obstetrics & Gynecology

## 2018-01-31 ENCOUNTER — Telehealth: Payer: Self-pay | Admitting: *Deleted

## 2018-01-31 NOTE — Telephone Encounter (Signed)
Refill request for meloxicam. Dr. Milinda Pointer states pt needs an appt prior to future refills. Return fax denying.

## 2018-02-22 DIAGNOSIS — M542 Cervicalgia: Secondary | ICD-10-CM | POA: Diagnosis not present

## 2018-05-07 ENCOUNTER — Ambulatory Visit (INDEPENDENT_AMBULATORY_CARE_PROVIDER_SITE_OTHER): Payer: BLUE CROSS/BLUE SHIELD

## 2018-05-07 ENCOUNTER — Encounter: Payer: Self-pay | Admitting: Podiatry

## 2018-05-07 ENCOUNTER — Ambulatory Visit: Payer: BLUE CROSS/BLUE SHIELD | Admitting: Podiatry

## 2018-05-07 DIAGNOSIS — M7752 Other enthesopathy of left foot: Secondary | ICD-10-CM

## 2018-05-07 DIAGNOSIS — M7661 Achilles tendinitis, right leg: Secondary | ICD-10-CM | POA: Diagnosis not present

## 2018-05-07 DIAGNOSIS — M779 Enthesopathy, unspecified: Secondary | ICD-10-CM | POA: Diagnosis not present

## 2018-05-07 DIAGNOSIS — M778 Other enthesopathies, not elsewhere classified: Secondary | ICD-10-CM

## 2018-05-07 MED ORDER — METHYLPREDNISOLONE 4 MG PO TBPK: ORAL_TABLET | ORAL | 0 refills | Status: DC

## 2018-05-07 MED ORDER — MELOXICAM 15 MG PO TABS: 15.0000 mg | ORAL_TABLET | Freq: Every day | ORAL | 3 refills | Status: DC

## 2018-05-08 NOTE — Progress Notes (Signed)
Kimberly Miles presents today for follow-up of pain to the dorsal and plantar forefoot she states that it is tender swollen times the past few months feels like there is a knot on the bottom.  Tried meloxicam with some help.  This is on the left foot she is also noticed some posterior heel pain on the right particularly in the mornings.  Objective: Vital signs are stable she is alert and oriented x3.  Pulses are palpable.  Neurologic sensorium is intact.  She has pain on range of motion of the second metatarsophalangeal joint of the left foot.  Tolerated endrange dorsiflexion but not plantarflexion.  Has pain on palpation of the plantar aspect of the entire plate.  This is located primarily in the second metatarsal phalangeal joint.  Radiographs taken today do not demonstrate any type of major abnormality in this area.  She has mild tenderness on palpation of the right Achilles but minimally so.  Assessment: Insertional Achilles tendinitis right.  Left capsulitis second metatarsal phalangeal joint.  Plan: Discussed etiology pathology conservative surgical therapies at this point I requested she start with a Medrol Dosepak to be followed by meloxicam.  Also injected the second metatarsal phalangeal joint area with 2 mg of dexamethasone and local anesthetic.  She tolerated procedure well without complications.  I also discussed appropriate shoe gear stretching exercise ice therapy sugar modifications.

## 2018-05-22 DIAGNOSIS — D1801 Hemangioma of skin and subcutaneous tissue: Secondary | ICD-10-CM | POA: Diagnosis not present

## 2018-05-22 DIAGNOSIS — L821 Other seborrheic keratosis: Secondary | ICD-10-CM | POA: Diagnosis not present

## 2018-05-22 DIAGNOSIS — D225 Melanocytic nevi of trunk: Secondary | ICD-10-CM | POA: Diagnosis not present

## 2018-05-22 DIAGNOSIS — Z85828 Personal history of other malignant neoplasm of skin: Secondary | ICD-10-CM | POA: Diagnosis not present

## 2018-06-04 ENCOUNTER — Ambulatory Visit: Payer: BLUE CROSS/BLUE SHIELD | Admitting: Podiatry

## 2018-06-06 ENCOUNTER — Ambulatory Visit: Payer: BLUE CROSS/BLUE SHIELD | Admitting: Podiatry

## 2018-06-06 DIAGNOSIS — M7752 Other enthesopathy of left foot: Secondary | ICD-10-CM

## 2018-06-06 DIAGNOSIS — M778 Other enthesopathies, not elsewhere classified: Secondary | ICD-10-CM

## 2018-06-06 DIAGNOSIS — M779 Enthesopathy, unspecified: Principal | ICD-10-CM

## 2018-06-06 DIAGNOSIS — M7661 Achilles tendinitis, right leg: Secondary | ICD-10-CM

## 2018-06-06 NOTE — Progress Notes (Signed)
She presents today chief complaint of pain to her left foot states it was feeling great until she went shopping yesterday she had a major flareup and she is going to [place] in a couple of weeks and does not want that to her and she refers to the second metatarsal phalangeal joint of the left foot.  Objective: Vital signs are stable she is alert oriented x3 she has pain on end range of motion the second metatarsal phalangeal joint of the left foot.  No plantar pain but most of it is dorsally.  Assessment: Capsulitis second metatarsal phalangeal joint left foot.  Plan: After sterile Betadine skin prep I injected 2 mg of dexamethasone with local anesthetic into the joint directly with 30-gauge needle.  Tolerated procedure well without complications instructed her to follow-up with me after her return from [place] if necessary.

## 2018-06-07 ENCOUNTER — Encounter: Payer: Self-pay | Admitting: Obstetrics & Gynecology

## 2018-06-07 ENCOUNTER — Ambulatory Visit (INDEPENDENT_AMBULATORY_CARE_PROVIDER_SITE_OTHER): Payer: BLUE CROSS/BLUE SHIELD | Admitting: Obstetrics & Gynecology

## 2018-06-07 ENCOUNTER — Other Ambulatory Visit: Payer: Self-pay

## 2018-06-07 VITALS — BP 116/80 | HR 64 | Resp 16 | Ht 58.5 in | Wt 108.0 lb

## 2018-06-07 DIAGNOSIS — E031 Congenital hypothyroidism without goiter: Secondary | ICD-10-CM

## 2018-06-07 DIAGNOSIS — Z01419 Encounter for gynecological examination (general) (routine) without abnormal findings: Secondary | ICD-10-CM

## 2018-06-07 NOTE — Progress Notes (Signed)
62 y.o. G2P2 Married Declined female here for annual exam.  Doing well but having some issues with fluid between a toe joint.  She has gotten a couple of injections.    Denies vaginal bleeding.     Going to [place] after Christmas.    Denies vaginal bleeding.    Does not typically receive a flu shot.  Doesn't typically receive vaccines.    Patient's last menstrual period was 08/25/2010.          Sexually active: Yes.    The current method of family planning is post menopausal status.    Exercising: No Smoker:  No   Health Maintenance: Pap:  02/27/17 Neg. HR HPV:neg  07/21/15 Neg History of abnormal Pap:  yes MMG:  11/16/17 BIRADS1:Neg Colonoscopy:  2006 normal. Cologuard 04/13/17 Neg.  Repeat in 2021. BMD:   11/03/15 Osteoporosis  TDaP:  2015 Pneumonia vaccine(s):  2015 Shingrix:   No Hep C testing: 05/04/14 Neg  Screening Labs: TSH?   reports that she has never smoked. She has never used smokeless tobacco. She reports current alcohol use. She reports that she does not use drugs.  Past Medical History:  Diagnosis Date  . Anxiety   . Arthritis   . Common migraine with intractable migraine 04/06/2016  . Diverticulosis   . Elevated LFTs    monitored by pcp per pt --  unknown etiology  . Family history of premature CAD 09/01/2013  . History of basal cell carcinoma excision    2004   &  2014  . History of colitis   . History of palpitations   . 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 IBS (irritable bowel syndrome)   . Migraine   . MTHFR gene mutation ([place])   . PMB (postmenopausal bleeding)   . Post-surgical hypothyroidism     Past Surgical History:  Procedure Laterality Date  . APPENDECTOMY  as child  . BREAST BIOPSY Left 1979   benign  . CARDIOVASCULAR STRESS TEST  09-16-2013   normal nuclear study w/ no ischemia/  normal LV function and wall motion , ef 84%  . CESAREAN SECTION  1977 and 1978    Bilateral Tubal Ligation w/ last c/s  . COLPOSCOPY  05/2009   CIN 1  . HYSTEROSCOPY W/D&C N/A 12/23/2015   Procedure: DILATATION AND CURETTAGE /HYSTEROSCOPY ;  Surgeon: Megan Salon, MD;  Location: [facility];  Service: Gynecology;  Laterality: N/A;  . NEGATIVE SLEEP STUDY  2014  per pt  . OOPHORECTOMY Right 1985   ruptured cyst   . THYROID LOBECTOMY Right 03-08-2011  . TRANSTHORACIC ECHOCARDIOGRAM  05-09-2011   ef 64%/  mild MR and TR    Current Outpatient Medications  Medication Sig Dispense Refill  . butalbital-aspirin-caffeine (FIORINAL) 50-325-40 MG capsule Take 1 capsule by mouth every 6 (six) hours as needed for headache. 8 capsule 0  . Cholecalciferol (VITAMIN D3) 5000 UNITS CAPS Take 1 capsule by mouth daily.    . cyclobenzaprine (FLEXERIL) 5 MG tablet Take 1 tablet by mouth daily as needed.     . Melatonin 3 MG TABS Take 1 mg by mouth at bedtime.     . meloxicam (MOBIC) 15 MG tablet Take 1 tablet (15 mg total) by mouth daily. 30 tablet 3  . naratriptan (AMERGE) 2.5 MG tablet Take 1 tablet (2.5 mg total) by mouth 2 (two) times daily as  needed for migraine. 10 tablet 3  . zolpidem (AMBIEN) 5 MG tablet Take 1 tablet (5 mg total) by mouth at bedtime as needed for sleep. 30 tablet 0   No current facility-administered medications for this visit.     Family History  Problem Relation Age of Onset  . Hypertension Maternal Grandmother   . Uterine cancer Maternal Grandmother   . Vascular Disease Maternal Grandmother   . Lung cancer Maternal Grandmother   . Stroke Maternal Grandfather   . Prostate cancer Paternal Grandfather   . Migraines Paternal Grandmother   . Lung cancer Mother 61  . Hypertension Mother 24  . Hyperlipidemia Mother 58  . Ehlers-Danlos syndrome Mother   . Kidney disease Mother   . Fibromyalgia Mother   . Other Father        bypass surgery times 2  . Migraines Father   . Osteoporosis Father     Review of Systems  All other systems  reviewed and are negative.   Exam:   BP 116/80 (BP Location: Left Arm, Patient Position: Sitting, Cuff Size: Normal)   Pulse 64   Resp 16   Ht 4' 10.5" (1.486 m)   Wt 108 lb (49 kg)   LMP 08/25/2010   BMI 22.19 kg/m   Height: 4' 10.5" (148.6 cm)  Ht Readings from Last 3 Encounters:  06/07/18 4' 10.5" (1.486 m)  10/03/17 4\' 11"  (1.499 m)  09/26/17 4\' 11"  (1.499 m)    General appearance: alert, cooperative and appears stated age Head: Normocephalic, without obvious abnormality, atraumatic Neck: no adenopathy, supple, symmetrical, trachea midline and thyroid normal to inspection and palpation Lungs: clear to auscultation bilaterally Breasts: normal appearance, no masses or tenderness Heart: regular rate and rhythm Abdomen: soft, non-tender; bowel sounds normal; no masses,  no organomegaly Extremities: extremities normal, atraumatic, no cyanosis or edema Skin: Skin color, texture, turgor normal. No rashes or lesions Lymph nodes: Cervical, supraclavicular, and axillary nodes normal. No abnormal inguinal nodes palpated Neurologic: Grossly normal   Pelvic: External genitalia:  no lesions              Urethra:  normal appearing urethra with no masses, tenderness or lesions              Bartholins and Skenes: normal                 Vagina: atrophic vaginal changes, no lesions              Cervix: no lesions              Pap taken: No. Bimanual Exam:  Uterus:  normal size, contour, position, consistency, mobility, non-tender              Adnexa: normal adnexa and no mass, fullness, tenderness               Rectovaginal: Confirms               Anus:  normal sphincter tone, no lesions  Chaperone was present for exam.  A:  Well Woman with normal exam PMP, no HRT H/O vaginal atrophic changes Hypothyroidism  P:   Mammogram guidelines reviewed.  Doing yearly. pap smear with neg HR HPV 2018 Lab work done with Dr. Etter Sjogren Declines any new vaccines Cologuard due 2021 BMD will be  obtained to review for pt return annually or prn

## 2018-06-08 LAB — THYROID PANEL WITH TSH
FREE THYROXINE INDEX: 1.4 (ref 1.2–4.9)
T3 UPTAKE RATIO: 20 % — AB (ref 24–39)
T4 TOTAL: 7.1 ug/dL (ref 4.5–12.0)
TSH: 3.04 u[IU]/mL (ref 0.450–4.500)

## 2018-06-12 ENCOUNTER — Telehealth: Payer: Self-pay | Admitting: Obstetrics & Gynecology

## 2018-06-12 NOTE — Telephone Encounter (Signed)
Copy of [facility] BMD report dated 11/16/17 received and to Dr. Sabra Heck to review.   Call returned to patient, advised BMD report received and to Dr. Sabra Heck to review. Advised our office will return call once reviewed. Patient verbalizes understanding.

## 2018-06-12 NOTE — Telephone Encounter (Signed)
Patient calling regarding bone density results.

## 2018-06-25 NOTE — Telephone Encounter (Signed)
Call to [facility], requested copy of 11/16/17 BMD be faxed to [facility].

## 2018-06-25 NOTE — Telephone Encounter (Signed)
BMD received and to Dr. Sabra Heck for review.

## 2018-06-25 NOTE — Telephone Encounter (Signed)
I do not have this BMD and have reviewed all outside labs.  Can you please call for it again?  Thanks.

## 2018-07-01 NOTE — Telephone Encounter (Signed)
Spoke with patient, advised of BMD results per Dr. Sabra Heck. Patient request copy of BMD results be mailed to address on file. Patient declines OV for consult at this time. Patient aware to return call to office if desire OV to further discuss. Patient verbalizes understanding and is agreeable.   Copy of BMD results placed on [place] Mail.  Copy of results to scan.   Routing to provider for final review. Patient is agreeable to disposition. Will close encounter.

## 2018-07-19 ENCOUNTER — Other Ambulatory Visit: Payer: Self-pay | Admitting: Family Medicine

## 2018-07-19 MED ORDER — NARATRIPTAN HCL 2.5 MG PO TABS: 2.5000 mg | ORAL_TABLET | Freq: Two times a day (BID) | ORAL | 2 refills | Status: DC | PRN

## 2018-07-19 NOTE — Telephone Encounter (Signed)
Copied from [place] [id number]. Topic: Quick Communication - Rx Refill/Question >> Jul 19, 2018  1:31 PM Margot Ables wrote: Medication: naratriptan (AMERGE) 2.5 MG tablet  - pt is out of medication  Has the patient contacted their pharmacy? Yes - pt states pharmacy has been requesting since Tuesday "electronically" and today sent "manually" as they have not received response Preferred Pharmacy (with phone number or street name): [id number] - [place], [place] - [address] [phone] (Phone) [phone] (Fax)

## 2018-07-19 NOTE — Telephone Encounter (Signed)
Requested Prescriptions  Pending Prescriptions Disp Refills  . naratriptan (AMERGE) 2.5 MG tablet 10 tablet 2    Sig: Take 1 tablet (2.5 mg total) by mouth 2 (two) times daily as needed for migraine.     Neurology:  Migraine Therapy - Triptan Passed - 07/19/2018  1:35 PM      Passed - Last BP in normal range    BP Readings from Last 1 Encounters:  06/07/18 116/80         Passed - Valid encounter within last 12 months    Recent Outpatient Visits          9 months ago Herpes zoster without complication   [facility] [place], [place]   9 months ago Hypothyroidism, unspecified type   [facility] [place], [place]   1 year ago Other fatigue   [facility] [place], [place]   1 year ago Acute cystitis with hematuria   [facility] at [facility], [place], [place]   1 year ago Pharyngitis, unspecified etiology   [facility] at [place], [place]

## 2018-09-06 DIAGNOSIS — H43811 Vitreous degeneration, right eye: Secondary | ICD-10-CM | POA: Diagnosis not present

## 2018-09-06 DIAGNOSIS — D3132 Benign neoplasm of left choroid: Secondary | ICD-10-CM | POA: Diagnosis not present

## 2018-09-06 DIAGNOSIS — H2513 Age-related nuclear cataract, bilateral: Secondary | ICD-10-CM | POA: Diagnosis not present

## 2018-09-06 DIAGNOSIS — H25013 Cortical age-related cataract, bilateral: Secondary | ICD-10-CM | POA: Diagnosis not present

## 2018-09-10 ENCOUNTER — Telehealth: Payer: Self-pay | Admitting: *Deleted

## 2018-09-10 NOTE — Telephone Encounter (Signed)
Received Comprehensive Eye Exam Report from [facility]; forwarded to provider/SLS 03/17

## 2018-11-07 ENCOUNTER — Telehealth: Payer: Self-pay | Admitting: Family Medicine

## 2018-11-07 NOTE — Telephone Encounter (Signed)
Copied from [place] [id number]. Topic: General - Inquiry >> Nov 07, 2018  4:06 PM Mathis Bud wrote: Reason for CRM: Patient called stating she has new insurance (medicate) Patience wants to switch (naratriptan (AMERGE) 2.5 MG tablet ) migraine medication due to with the new insurance this medication is too expensive.  She would like PCP or nurse to call back to discuss new migraine options.  Patient also switched pharmacy due to same reason, new pharmacy is : Insurance claims handler : [address], [place] [address] [phone] Patient call back # [phone]

## 2018-11-07 NOTE — Telephone Encounter (Signed)
Copied from [place] [id number]. Topic: General - Inquiry >> Nov 07, 2018  4:06 PM Mathis Bud wrote: Reason for CRM: Patient called stating she has new insurance (medicate) Patience wants to switch (naratriptan (AMERGE) 2.5 MG tablet ) migraine medication due to with the new insurance this medication is too expensive.  She would like PCP or nurse to call back to discuss new migraine options.  Patient also switched pharmacy due to same reason, new pharmacy is : Insurance claims handler : [address] [place] [address] [phone] Patient call back # [phone]

## 2018-11-08 NOTE — Telephone Encounter (Signed)
Duplicate telephone note.  

## 2018-11-08 NOTE — Telephone Encounter (Signed)
Spoke with patient and set up app with [place] to discuss new medication

## 2018-11-11 ENCOUNTER — Other Ambulatory Visit: Payer: Self-pay

## 2018-11-11 ENCOUNTER — Ambulatory Visit (INDEPENDENT_AMBULATORY_CARE_PROVIDER_SITE_OTHER): Payer: Medicare Other | Admitting: Family Medicine

## 2018-11-11 ENCOUNTER — Encounter: Payer: Self-pay | Admitting: Family Medicine

## 2018-11-11 DIAGNOSIS — G43009 Migraine without aura, not intractable, without status migrainosus: Secondary | ICD-10-CM

## 2018-11-11 DIAGNOSIS — E039 Hypothyroidism, unspecified: Secondary | ICD-10-CM | POA: Insufficient documentation

## 2018-11-11 MED ORDER — RIZATRIPTAN BENZOATE 10 MG PO TABS: 10.0000 mg | ORAL_TABLET | ORAL | 1 refills | Status: DC | PRN

## 2018-11-11 NOTE — Assessment & Plan Note (Addendum)
Stable off meds Lab Results  Component Value Date   TSH 3.040 06/07/2018   T4TOTAL 7.1 06/07/2018  will check at med wellness

## 2018-11-11 NOTE — Progress Notes (Signed)
Virtual Visit via Video Note  I connected with Kimberly Miles on 11/11/18 at 10:00 AM EDT by a video enabled telemedicine application and verified that I am speaking with the correct person using two identifiers.  Location: Patient: home Provider: home    I discussed the limitations of evaluation and management by telemedicine and the availability of in person appointments. The patient expressed understanding and agreed to proceed.  History of Present Illness: Pt is home and has no complaints.   She needs to change her migraine med due to change in insurance.  She will schedule welcome to medicare visit  She had stopped her thyroid med because she wanted to see if she could do without it-- last labs were normal.  She is feeling tired but still wants to hold off    Past Medical History:  Diagnosis Date  . Anxiety   . Arthritis   . Common migraine with intractable migraine 04/06/2016  . Diverticulosis   . Elevated LFTs    monitored by pcp per pt --  unknown etiology  . Family history of premature CAD 09/01/2013  . History of basal cell carcinoma excision    2004   &  2014  . History of colitis   . History of palpitations   . 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 IBS (irritable bowel syndrome)   . Migraine   . MTHFR gene mutation ([place])   . PMB (postmenopausal bleeding)   . Post-surgical hypothyroidism    Current Outpatient Medications on File Prior to Visit  Medication Sig Dispense Refill  . butalbital-aspirin-caffeine (FIORINAL) 50-325-40 MG capsule Take 1 capsule by mouth every 6 (six) hours as needed for headache. 8 capsule 0  . Cholecalciferol (VITAMIN D3) 5000 UNITS CAPS Take 1 capsule by mouth daily.    . cyclobenzaprine (FLEXERIL) 5 MG tablet Take 1 tablet by mouth daily as needed.     . Melatonin 3 MG TABS Take 1 mg by mouth at bedtime.     Marland Kitchen zolpidem (AMBIEN) 5 MG tablet Take 1 tablet (5 mg  total) by mouth at bedtime as needed for sleep. 30 tablet 0   No current facility-administered medications on file prior to visit.     Observations/Objective: Unable to get vitals  Pt in NAD rr normal  Neatly dressed   Assessment and Plan: 1. Migraine without aura and without status migrainosus, not intractable Change to maxalt   - rizatriptan (MAXALT) 10 MG tablet; Take 1 tablet (10 mg total) by mouth as needed for migraine. May repeat in 2 hours if needed  Dispense: 12 tablet; Refill: 1  2. Hypothyroidism, unspecified type Stable off meds Check labs during med wellness   Follow Up Instructions:    I discussed the assessment and treatment plan with the patient. The patient was provided an opportunity to ask questions and all were answered. The patient agreed with the plan and demonstrated an understanding of the instructions.   The patient was advised to call back or seek an in-person evaluation if the symptoms worsen or if the condition fails to improve as anticipated.  I provided 25 minutes of non-face-to-face time during this encounter.   Ann Held, DO

## 2018-11-25 DIAGNOSIS — C801 Malignant (primary) neoplasm, unspecified: Secondary | ICD-10-CM

## 2018-11-25 HISTORY — DX: Malignant (primary) neoplasm, unspecified: C80.1

## 2018-11-29 ENCOUNTER — Other Ambulatory Visit: Payer: Self-pay | Admitting: Obstetrics & Gynecology

## 2018-11-29 ENCOUNTER — Ambulatory Visit (INDEPENDENT_AMBULATORY_CARE_PROVIDER_SITE_OTHER): Payer: Medicare Other | Admitting: Obstetrics & Gynecology

## 2018-11-29 ENCOUNTER — Other Ambulatory Visit: Payer: Self-pay

## 2018-11-29 ENCOUNTER — Encounter: Payer: Self-pay | Admitting: Obstetrics & Gynecology

## 2018-11-29 ENCOUNTER — Telehealth: Payer: Self-pay | Admitting: Obstetrics & Gynecology

## 2018-11-29 VITALS — BP 112/78 | HR 84 | Temp 97.9°F | Ht 58.5 in | Wt 111.0 lb

## 2018-11-29 DIAGNOSIS — R234 Changes in skin texture: Secondary | ICD-10-CM | POA: Diagnosis not present

## 2018-11-29 DIAGNOSIS — N6310 Unspecified lump in the right breast, unspecified quadrant: Secondary | ICD-10-CM

## 2018-11-29 NOTE — Progress Notes (Signed)
Cancelled Dx MMG at [facility] for 12-13-18 and Scheduled Dx MMG w/US at [facility] 12-05-18 9:10am. Patient notified.

## 2018-11-29 NOTE — Progress Notes (Signed)
GYNECOLOGY  VISIT  CC:   Breast problem  HPI: 63 y.o. G2P2 Married Declined female here for complaint of dimpling in right breast.  Went for MMG today at [facility].  Was advised imaging could not be done until seen by provider.  She came to our office.  Denies recent trauma, skin changes, masses, nipple discharge or pain.  Last MMG 11/06/17.  She does have grade C breast density.  GYNECOLOGIC HISTORY: Patient's last menstrual period was 08/25/2010. Contraception: PMP Menopausal hormone therapy: none  Patient Active Problem List   Diagnosis Date Noted  . Hypothyroid 11/11/2018  . Memory change 11/13/2016  . Common migraine with intractable migraine 04/06/2016  . H/O partial thyroidectomy 01/14/2014  . Family history of premature CAD 09/01/2013  . History of migraine headaches 02/21/2013  . Hyperlipidemia 02/21/2013  . Thyroid function study abnormality 02/21/2013    Past Medical History:  Diagnosis Date  . Anxiety   . Arthritis   . Common migraine with intractable migraine 04/06/2016  . Diverticulosis   . Elevated LFTs    monitored by pcp per pt --  unknown etiology  . Family history of premature CAD 09/01/2013  . History of basal cell carcinoma excision    2004   &  2014  . History of colitis   . History of palpitations   . 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 IBS (irritable bowel syndrome)   . Migraine   . MTHFR gene mutation ([place])   . PMB (postmenopausal bleeding)   . Post-surgical hypothyroidism     Past Surgical History:  Procedure Laterality Date  . APPENDECTOMY  as child  . BREAST BIOPSY Left 1979   benign  . CARDIOVASCULAR STRESS TEST  09-16-2013   normal nuclear study w/ no ischemia/  normal LV function and wall motion , ef 84%  . CESAREAN SECTION  1977 and 1978   Bilateral Tubal Ligation w/ last c/s  . COLPOSCOPY  05/2009   CIN 1  . HYSTEROSCOPY W/D&C N/A 12/23/2015   Procedure: DILATATION AND CURETTAGE /HYSTEROSCOPY ;  Surgeon: Megan Salon, MD;  Location: [facility];  Service: Gynecology;  Laterality: N/A;  . NEGATIVE SLEEP STUDY  2014  per pt  . OOPHORECTOMY Right 1985   ruptured cyst   . THYROID LOBECTOMY Right 03-08-2011  . TRANSTHORACIC ECHOCARDIOGRAM  05-09-2011   ef 64%/  mild MR and TR    MEDS:   Current Outpatient Medications on File Prior to Visit  Medication Sig Dispense Refill  . butalbital-aspirin-caffeine (FIORINAL) 50-325-40 MG capsule Take 1 capsule by mouth every 6 (six) hours as needed for headache. 8 capsule 0  . Cholecalciferol (VITAMIN D3) 5000 UNITS CAPS Take 1 capsule by mouth daily.    . cyclobenzaprine (FLEXERIL) 5 MG tablet Take 1 tablet by mouth daily as needed.     Marland Kitchen levothyroxine (SYNTHROID) 75 MCG tablet Take 75 mcg by mouth daily before breakfast.    . Melatonin 3 MG TABS Take 1 mg by mouth at bedtime.     . rizatriptan (MAXALT) 10 MG tablet Take 1 tablet (10 mg total) by mouth as needed for migraine. May repeat in 2 hours if needed 12 tablet 1  . zolpidem (AMBIEN) 5 MG tablet Take 1 tablet (5 mg total) by mouth at bedtime as needed for sleep. (Patient not taking: Reported on 11/29/2018) 30 tablet 0   No  current facility-administered medications on file prior to visit.     ALLERGIES: Codeine; Demerol [meperidine]; Erythromycin; Sulfa antibiotics; and Ampicillin  Family History  Problem Relation Age of Onset  . Hypertension Maternal Grandmother   . Uterine cancer Maternal Grandmother   . Vascular Disease Maternal Grandmother   . Lung cancer Maternal Grandmother   . Stroke Maternal Grandfather   . Prostate cancer Paternal Grandfather   . Migraines Paternal Grandmother   . Lung cancer Mother 64  . Hypertension Mother 47  . Hyperlipidemia Mother 35  . Ehlers-Danlos syndrome Mother   . Kidney disease Mother   . Fibromyalgia Mother   . Other Father        bypass surgery times 2  . Migraines Father    . Osteoporosis Father     SH:  Married, non smoker  Review of Systems  All other systems reviewed and are negative.   PHYSICAL EXAMINATION:    BP 112/78   Pulse 84   Temp 97.9 F (36.6 C) (Temporal)   Ht 4' 10.5" (1.486 m)   Wt 111 lb (50.3 kg)   LMP 08/25/2010   BMI 22.80 kg/m     Physical Exam  Constitutional: She is oriented to person, place, and time. She appears well-developed and well-nourished.  Respiratory: Right breast exhibits skin change. Right breast exhibits no inverted nipple, no mass, no nipple discharge and no tenderness. Left breast exhibits no inverted nipple, no mass, no nipple discharge, no skin change and no tenderness. Breasts are symmetrical.    Lymphadenopathy:    She has no axillary adenopathy.       Right: No supraclavicular adenopathy present.       Left: No supraclavicular adenopathy present.  Neurological: She is alert and oriented to person, place, and time.  Skin: Skin is warm and dry.  Psychiatric: She has a normal mood and affect.    Chaperone was present for exam.  Assessment: Right breast dimple/skin change with ridge beneath  Plan: Diagnostic MMG scheduled at [place] for 12/05/2018.

## 2018-11-29 NOTE — Telephone Encounter (Signed)
Spoke with patient. She is at [facility] for screening MMG. Patient states has indentation in [address] Gory will not do screening. Advised patient to come to office for breast check with Dr.Miller now.

## 2018-11-29 NOTE — Telephone Encounter (Signed)
Patient is having a breast problem and was unable to get imaging at [facility] and was told to see Dr.Miller. Patient is still at [facility].

## 2018-11-29 NOTE — Progress Notes (Signed)
Scheduled Bil.Dx MMG w/poss. [place] 12-13-2018 8:30am at [facility]. Patient made aware. Advised this was first available appointment. They spoke with [facility] office to verify no appointments sooner than 2 weeks.

## 2018-12-05 ENCOUNTER — Ambulatory Visit
Admission: RE | Admit: 2018-12-05 | Discharge: 2018-12-05 | Disposition: A | Discharge status: Home / Self Care | Payer: Medicare Other | Source: Ambulatory Visit | Attending: Obstetrics & Gynecology | Admitting: Obstetrics & Gynecology

## 2018-12-05 ENCOUNTER — Other Ambulatory Visit: Payer: Self-pay | Admitting: Obstetrics & Gynecology

## 2018-12-05 ENCOUNTER — Other Ambulatory Visit: Payer: Self-pay

## 2018-12-05 DIAGNOSIS — R928 Other abnormal and inconclusive findings on diagnostic imaging of breast: Secondary | ICD-10-CM | POA: Diagnosis not present

## 2018-12-05 DIAGNOSIS — N6310 Unspecified lump in the right breast, unspecified quadrant: Secondary | ICD-10-CM

## 2018-12-05 DIAGNOSIS — N6323 Unspecified lump in the left breast, lower outer quadrant: Secondary | ICD-10-CM | POA: Diagnosis not present

## 2018-12-09 ENCOUNTER — Other Ambulatory Visit: Payer: Self-pay

## 2018-12-09 ENCOUNTER — Ambulatory Visit
Admission: RE | Admit: 2018-12-09 | Discharge: 2018-12-09 | Disposition: A | Discharge status: Home / Self Care | Payer: Medicare Other | Source: Ambulatory Visit | Attending: Obstetrics & Gynecology | Admitting: Obstetrics & Gynecology

## 2018-12-09 DIAGNOSIS — N6314 Unspecified lump in the right breast, lower inner quadrant: Secondary | ICD-10-CM | POA: Diagnosis not present

## 2018-12-09 DIAGNOSIS — N6310 Unspecified lump in the right breast, unspecified quadrant: Secondary | ICD-10-CM

## 2018-12-09 DIAGNOSIS — C50311 Malignant neoplasm of lower-inner quadrant of right female breast: Secondary | ICD-10-CM | POA: Diagnosis not present

## 2018-12-12 ENCOUNTER — Encounter: Payer: Self-pay | Admitting: General Surgery

## 2018-12-16 DIAGNOSIS — Z90721 Acquired absence of ovaries, unilateral: Secondary | ICD-10-CM | POA: Diagnosis not present

## 2018-12-16 DIAGNOSIS — Z9079 Acquired absence of other genital organ(s): Secondary | ICD-10-CM | POA: Diagnosis not present

## 2018-12-16 DIAGNOSIS — Z9049 Acquired absence of other specified parts of digestive tract: Secondary | ICD-10-CM | POA: Diagnosis not present

## 2018-12-16 DIAGNOSIS — Z98891 History of uterine scar from previous surgery: Secondary | ICD-10-CM | POA: Diagnosis not present

## 2018-12-16 DIAGNOSIS — Z9889 Other specified postprocedural states: Secondary | ICD-10-CM | POA: Diagnosis not present

## 2018-12-16 DIAGNOSIS — K589 Irritable bowel syndrome without diarrhea: Secondary | ICD-10-CM | POA: Diagnosis not present

## 2018-12-16 DIAGNOSIS — C50311 Malignant neoplasm of lower-inner quadrant of right female breast: Secondary | ICD-10-CM | POA: Diagnosis not present

## 2018-12-17 ENCOUNTER — Other Ambulatory Visit: Payer: Self-pay | Admitting: General Surgery

## 2018-12-17 ENCOUNTER — Telehealth: Payer: Self-pay | Admitting: Hematology and Oncology

## 2018-12-17 DIAGNOSIS — C50311 Malignant neoplasm of lower-inner quadrant of right female breast: Secondary | ICD-10-CM

## 2018-12-17 NOTE — Telephone Encounter (Signed)
Received an urgent referral from Dr. Dalbert Batman at [place] for new dx of breast cancer. Pt has been cld and scheduled to see [name] on 6/30 at 345pm. Kimberly Miles has agreed to the appt date and time.

## 2018-12-18 ENCOUNTER — Other Ambulatory Visit: Payer: Self-pay

## 2018-12-18 ENCOUNTER — Ambulatory Visit
Admission: RE | Admit: 2018-12-18 | Discharge: 2018-12-18 | Disposition: A | Discharge status: Home / Self Care | Payer: Medicare Other | Source: Ambulatory Visit | Attending: General Surgery | Admitting: General Surgery

## 2018-12-18 DIAGNOSIS — C50311 Malignant neoplasm of lower-inner quadrant of right female breast: Secondary | ICD-10-CM

## 2018-12-18 DIAGNOSIS — C50911 Malignant neoplasm of unspecified site of right female breast: Secondary | ICD-10-CM | POA: Diagnosis not present

## 2018-12-18 MED ORDER — GADOBUTROL 1 MMOL/ML IV SOLN
5.0000 mL | Freq: Once | INTRAVENOUS | Status: AC | PRN
  Administered 2018-12-18: 5 mL via INTRAVENOUS

## 2018-12-20 ENCOUNTER — Other Ambulatory Visit: Payer: Self-pay | Admitting: General Surgery

## 2018-12-20 DIAGNOSIS — R9389 Abnormal findings on diagnostic imaging of other specified body structures: Secondary | ICD-10-CM

## 2018-12-23 NOTE — Progress Notes (Signed)
Location of Breast Cancer: Right Breast  Histology per Pathology Report:  12/09/18 Diagnosis Breast, right, needle core biopsy, 5:30 o'clock - INVASIVE DUCTAL CARCINOMA  Receptor Status: ER(100%), PR (50%), Her2-neu (NEG), Ki-(50%)  Did patient present with symptoms or was this found on screening mammography?: She presented to her GYN on 11/29/18 reporting dimpling to her right breast.   Past/Anticipated interventions by surgeon, if any: Dr. Dalbert Batman surgery not scheduled yet.    Past/Anticipated interventions by medical oncology, if any:  12/24/18 [name]  Lymphedema issues, if any:  N/A  Pain issues, if any:  No  SAFETY ISSUES:  Prior radiation? No  Pacemaker/ICD? No  Possible current pregnancy? No  Is the patient on methotrexate? No  Current Complaints / other details:      Kimberly Miles, Stephani Police, RN 12/23/2018,9:24 AM

## 2018-12-23 NOTE — Progress Notes (Signed)
[facility] NOTE  Patient Care Team: Carollee Herter, Alferd Apa, DO as PCP - General (Family Medicine) Orie Rout, MD as Referring Physician (Specialist) Elveria Rising, MD as Consulting Physician (Obstetrics and Gynecology) Izora Gala, MD as Consulting Physician (Otolaryngology) Megan Salon, MD as Consulting Physician (Gynecology)  CHIEF COMPLAINTS/PURPOSE OF CONSULTATION:  Newly diagnosed breast cancer  HISTORY OF PRESENTING ILLNESS:  Kimberly Miles 63 y.o. female is here because of recent diagnosis of invasive ductal carcinoma of the right breast. The cancer was detected on a diagnostic mammogram and [place] on 12/05/18 after the patient palpated a lump in the right breast for 2 months. It showed a mass in the right breast at the 5:30 position measuring 2.1cm, no right axillary adenopathy, and no evidence of malignancy in the left breast. Breast biopsy on 12/09/18 showed invasive ductal carcinoma, HER2 negative by FISH, ER 100%, PR 50%, Ki67 50%. Breast MRI on 12/18/18 showed the 2.3cm mass in the right breast with contiguous non-mass enhancement and questionable skin involvement. Additionally, there was an irregular mass in the left breast measuring 15m. There was no evidence of metastatic lymphoadenopathy. She presents to the clinic today for initial evaluation and consultation to discuss treatment options.   I reviewed her records extensively and collaborated the history with the patient.  SUMMARY OF ONCOLOGIC HISTORY: 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, estrogen receptor positive ([place]  12/24/2018 Initial Diagnosis   Palpable lump in the right breast, 2.1 cm, biopsy revealed grade 2 invasive ductal carcinoma that was ER 100%, PR 50%, Ki-67 50%, HER-2 negative, 2+ by IHC FISH ratio 1.27, T2N0 stage IB   12/24/2018 Cancer Staging   Staging form: Breast, AJCC 8th Edition - Clinical: Stage IB (cT2, cN0, cM0, G2, ER+, PR+,  HER2-) - Signed by SEppie Gibson MD on 12/24/2018     MEDICAL HISTORY:  Past Medical History:  Diagnosis Date  . Anxiety   . Arthritis   . Common migraine with intractable migraine 04/06/2016  . Diverticulosis   . Elevated LFTs    monitored by pcp per pt --  unknown etiology  . Family history of premature CAD 09/01/2013  . History of basal cell carcinoma excision    2004   &  2014  . History of colitis   . History of palpitations   . 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 IBS (irritable bowel syndrome)   . Migraine   . MTHFR gene mutation ([place]   . PMB (postmenopausal bleeding)   . Post-surgical hypothyroidism     SURGICAL HISTORY: Past Surgical History:  Procedure Laterality Date  . APPENDECTOMY  as child  . BREAST BIOPSY Left 1979   benign  . BREAST EXCISIONAL BIOPSY Left   . CARDIOVASCULAR STRESS TEST  09-16-2013   normal nuclear study w/ no ischemia/  normal LV function and wall motion , ef 84%  . CESAREAN SECTION  1977 and 1978   Bilateral Tubal Ligation w/ last c/s  . COLPOSCOPY  05/2009   CIN 1  . HYSTEROSCOPY W/D&C N/A 12/23/2015   Procedure: DILATATION AND CURETTAGE /HYSTEROSCOPY ;  Surgeon: MMegan Salon MD;  [facility]  Service: Gynecology;  Laterality: N/A;  . NEGATIVE SLEEP STUDY  2014  per pt  . OOPHORECTOMY Right 1985   ruptured cyst   . THYROID LOBECTOMY Right 03-08-2011  .  TRANSTHORACIC ECHOCARDIOGRAM  05-09-2011   ef 64%/  mild MR and TR    SOCIAL HISTORY: Social History   Socioeconomic History  . Marital status: Married    Spouse name: Not on file  . Number of children: 2  . Years of education: 49  . Highest education level: Not on file  Occupational History  . Occupation: Retired  Scientific laboratory technician  . Financial resource strain: Not on file  . Food insecurity    Worry: Not on file    Inability: Not on file  . Transportation needs     Medical: No    Non-medical: No  Tobacco Use  . Smoking status: Never Smoker  . Smokeless tobacco: Never Used  Substance and Sexual Activity  . Alcohol use: Yes    Alcohol/week: 0.0 standard drinks    Comment: rare  . Drug use: No  . Sexual activity: Yes    Partners: Male    Birth control/protection: Post-menopausal    Comment: BTL done w/ last C/S  Lifestyle  . Physical activity    Days per week: Not on file    Minutes per session: Not on file  . Stress: Not on file  Relationships  . Social Herbalist on phone: Not on file    Gets together: Not on file    Attends religious service: Not on file    Active member of club or organization: Not on file    Attends meetings of clubs or organizations: Not on file    Relationship status: Not on file  . Intimate partner violence    Fear of current or ex partner: No    Emotionally abused: No    Physically abused: No    Forced sexual activity: No  Other Topics Concern  . Not on file  Social History Narrative   Lives at home w/ her husband   Right-handed   Caffeine: 1/4 cup per day    FAMILY HISTORY: Family History  Problem Relation Age of Onset  . Hypertension Maternal Grandmother   . Uterine cancer Maternal Grandmother   . Vascular Disease Maternal Grandmother   . Lung cancer Maternal Grandmother   . Stroke Maternal Grandfather   . Prostate cancer Paternal Grandfather   . Migraines Paternal Grandmother   . Lung cancer Mother 55  . Hypertension Mother 24  . Hyperlipidemia Mother 39  . Ehlers-Danlos syndrome Mother   . Kidney disease Mother   . Fibromyalgia Mother   . Other Father        bypass surgery times 2  . Migraines Father   . Osteoporosis Father     ALLERGIES:  is allergic to codeine; demerol [meperidine]; erythromycin; sulfa antibiotics; and ampicillin.  MEDICATIONS:  Current Outpatient Medications  Medication Sig Dispense Refill  . anastrozole (ARIMIDEX) 1 MG tablet Take 1 tablet (1 mg total)  by mouth daily. 90 tablet 3  . Ascorbic Acid (VITAMIN C) 1000 MG tablet Take 1,000 mg by mouth daily. 2 tablet daily    . butalbital-aspirin-caffeine (FIORINAL) 50-325-40 MG capsule Take 1 capsule by mouth every 6 (six) hours as needed for headache. 8 capsule 0  . Cholecalciferol (VITAMIN D3) 5000 UNITS CAPS Take 1 capsule by mouth daily.    . cyclobenzaprine (FLEXERIL) 5 MG tablet Take 1 tablet by mouth daily as needed.     . iodine-sodium iodide 2-2.4 % solution Apply topically once.    Marland Kitchen levothyroxine (SYNTHROID) 75 MCG tablet Take 75 mcg by mouth daily  before breakfast.    . Melatonin 3 MG TABS Take 3 mg by mouth at bedtime.     . rizatriptan (MAXALT) 10 MG tablet Take 1 tablet (10 mg total) by mouth as needed for migraine. May repeat in 2 hours if needed 12 tablet 1  . zinc gluconate 50 MG tablet Take 50 mg by mouth daily.    Marland Kitchen zolpidem (AMBIEN) 5 MG tablet Take 1 tablet (5 mg total) by mouth at bedtime as needed for sleep. (Patient not taking: Reported on 11/29/2018) 30 tablet 0   No current facility-administered medications for this visit.     REVIEW OF SYSTEMS:   Constitutional: Denies fevers, chills or abnormal night sweats Eyes: Denies blurriness of vision, double vision or watery eyes Ears, nose, mouth, throat, and face: Denies mucositis or sore throat Respiratory: Denies cough, dyspnea or wheezes Cardiovascular: Denies palpitation, chest discomfort or lower extremity swelling Gastrointestinal:  Denies nausea, heartburn or change in bowel habits Skin: Denies abnormal skin rashes Lymphatics: Denies new lymphadenopathy or easy bruising Neurological:Denies numbness, tingling or new weaknesses Behavioral/Psych: Mood is stable, no new changes  Breast: palpable right breast lump with skin dimpling All other systems were reviewed with the patient and are negative.  PHYSICAL EXAMINATION: ECOG PERFORMANCE STATUS: 1 - Symptomatic but completely ambulatory  Vitals:   12/24/18 1536   BP: 120/62  Pulse: 67  Resp: 18  Temp: 98.4 F (36.9 C)  SpO2: 100%   Filed Weights   12/24/18 1536  Weight: 108 lb 4.8 oz (49.1 kg)    Physical exam not done due to COVID-19 precautions LABORATORY DATA:  I have reviewed the data as listed Lab Results  Component Value Date   WBC 5.9 03/22/2017   HGB 14.4 03/22/2017   HCT 43.3 03/22/2017   MCV 97.8 03/22/2017   PLT 210.0 03/22/2017   Lab Results  Component Value Date   NA 138 09/26/2017   K 4.3 09/26/2017   CL 101 09/26/2017   CO2 28 09/26/2017    RADIOGRAPHIC STUDIES: I have personally reviewed the radiological reports and agreed with the findings in the report.  ASSESSMENT AND PLAN:  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, estrogen receptor positive ([place]) 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%, Ki-67 50%, HER-2 negative, 2+ by IHC FISH ratio 1.27, T2N0 stage IB  Breast MRI: Spiculated mass in the lower right breast 2.3 x 2.3 x 1.3 cm with linear non-mass enhancement extending from posterior inferior margin all the way to the skin surface, 5 mm irregular mass UOQ left breast  Pathology and radiology counseling:Discussed with the patient, the details of pathology including the type of breast cancer,the clinical staging, the significance of ER, PR and HER-2/neu receptors and the implications for treatment. After reviewing the pathology in detail, we proceeded to discuss the different treatment options between surgery, radiation, chemotherapy, antiestrogen therapies.  Recommendations: MRI biopsy of the left breast lesion Oncotype DX has been sent by Dr. Dalbert Batman.  We will await the results. 1. Breast conserving surgery followed by 2. Adjuvant radiation therapy followed by 3. Adjuvant antiestrogen therapy with anastrozole 1 mg daily.  Oncotype counseling: I discussed Oncotype DX test. I explained to the patient  that this is a 21 gene panel to evaluate patient tumors DNA to calculate recurrence score. This would help determine whether patient has high risk or intermediate risk or low risk breast cancer. She  understands that if her tumor was found to be high risk, she would benefit from systemic chemotherapy. If low risk, no need of chemotherapy. If she was found to be intermediate risk, we would need to evaluate the score as well as other risk factors and determine if an abbreviated chemotherapy may be of benefit.  Patient is not interested in chemotherapy but she would like to know her prognosis. She agreed to starting neoadjuvant antiestrogen therapy with anastrozole. She apparently has osteoporosis and I instructed her that she needs to start bisphosphonate therapy. She has 1 bad tooth and needs to see a dentist.  I told her that we will start bisphosphonate after she gets clearance from dentist.  I will call her with results of Oncotype DX. Return to clinic after surgery to discuss final pathology report follow-up adjuvant treatment plan.  All questions were answered. The patient knows to call the clinic with any problems, questions or concerns.   Rulon Eisenmenger, MD 12/24/2018    I, Molly Dorshimer, am acting as scribe for Nicholas Lose, MD.  I have reviewed the above documentation for accuracy and completeness, and I agree with the above.

## 2018-12-24 ENCOUNTER — Ambulatory Visit
Admission: RE | Admit: 2018-12-24 | Discharge: 2018-12-24 | Disposition: A | Discharge status: Home / Self Care | Payer: Medicare Other | Source: Ambulatory Visit | Attending: Radiation Oncology | Admitting: Radiation Oncology

## 2018-12-24 ENCOUNTER — Other Ambulatory Visit: Payer: Self-pay

## 2018-12-24 ENCOUNTER — Encounter: Payer: Self-pay | Admitting: Radiation Oncology

## 2018-12-24 ENCOUNTER — Inpatient Hospital Stay: Payer: Medicare Other | Attending: Hematology and Oncology | Admitting: Hematology and Oncology

## 2018-12-24 ENCOUNTER — Ambulatory Visit
Admission: RE | Admit: 2018-12-24 | Discharge: 2018-12-24 | Disposition: A | Discharge status: Home / Self Care | Payer: Medicare Other | Source: Ambulatory Visit | Attending: Hematology and Oncology | Admitting: Hematology and Oncology

## 2018-12-24 DIAGNOSIS — Z808 Family history of malignant neoplasm of other organs or systems: Secondary | ICD-10-CM | POA: Diagnosis not present

## 2018-12-24 DIAGNOSIS — Z170 Estrogen receptor positive status [ER+]: Secondary | ICD-10-CM | POA: Insufficient documentation

## 2018-12-24 DIAGNOSIS — C50311 Malignant neoplasm of lower-inner quadrant of right female breast: Secondary | ICD-10-CM | POA: Insufficient documentation

## 2018-12-24 DIAGNOSIS — N6320 Unspecified lump in the left breast, unspecified quadrant: Secondary | ICD-10-CM | POA: Diagnosis not present

## 2018-12-24 DIAGNOSIS — Z801 Family history of malignant neoplasm of trachea, bronchus and lung: Secondary | ICD-10-CM

## 2018-12-24 DIAGNOSIS — Z86018 Personal history of other benign neoplasm: Secondary | ICD-10-CM | POA: Diagnosis not present

## 2018-12-24 DIAGNOSIS — Z8042 Family history of malignant neoplasm of prostate: Secondary | ICD-10-CM | POA: Insufficient documentation

## 2018-12-24 MED ORDER — ANASTROZOLE 1 MG PO TABS: 1.0000 mg | ORAL_TABLET | Freq: Every day | ORAL | 3 refills | Status: DC

## 2018-12-24 NOTE — Assessment & Plan Note (Signed)
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%, Ki-67 50%, HER-2 negative, 2+ by IHC FISH ratio 1.27, T2N0 stage IB  Breast MRI: Spiculated mass in the lower right breast 2.3 x 2.3 x 1.3 cm with linear non-mass enhancement extending from posterior inferior margin all the way to the skin surface, 5 mm irregular mass UOQ left breast  Pathology and radiology counseling:Discussed with the patient, the details of pathology including the type of breast cancer,the clinical staging, the significance of ER, PR and HER-2/neu receptors and the implications for treatment. After reviewing the pathology in detail, we proceeded to discuss the different treatment options between surgery, radiation, chemotherapy, antiestrogen therapies.  Recommendations: MRI biopsy of the left breast lesion 1. Breast conserving surgery followed by 2. Oncotype DX testing to determine if chemotherapy would be of any benefit followed by 3. Adjuvant radiation therapy followed by 4. Adjuvant antiestrogen therapy  Oncotype counseling: I discussed Oncotype DX test. I explained to the patient that this is a 21 gene panel to evaluate patient tumors DNA to calculate recurrence score. This would help determine whether patient has high risk or intermediate risk or low risk breast cancer. She understands that if her tumor was found to be high risk, she would benefit from systemic chemotherapy. If low risk, no need of chemotherapy. If she was found to be intermediate risk, we would need to evaluate the score as well as other risk factors and determine if an abbreviated chemotherapy may be of benefit.  Return to clinic after surgery to discuss final pathology report and then determine if Oncotype DX testing will need to be sent.

## 2018-12-24 NOTE — Progress Notes (Addendum)
Radiation Oncology         [phone] ________________________________  Initial  WEB EX Consultation (to reduce patient risk during the pandemic)  Name: Kimberly Miles MRN: [id number]  Date: 12/24/2018  DOB: May 08, 1956  TW:[id number] Kimberly Miles, Alferd Apa, DO  Fanny Skates, MD   REFERRING PHYSICIAN: Fanny Skates, MD  DIAGNOSIS:    ICD-10-CM   1. 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 female who presented to her gynecologist on 11/29/18 with dimpling in right breast. Bilateral diagnostic mammogram and ultrasound on the date of 12/05/18 showed a suspicious 2.1 cm right breast mass at the 5:30 position, 3 cm from the nipple, corresponding with the patient's palpable lump. No suspicious right axillary lymphadenopathy. No mammographic evidence of malignancy on the left. Biopsy on date of 12/09/18 showed invasive ductal carcinoma.  ER status: 100%, PR status: 50%, Her2 status: negative; Grade 2.   Bilateral breast MRI from 12/18/18 showed the known biopsy-proven invasive carcinoma within the lower right breast, 6:00 axis region, measuring 2.3 cm. Additional contiguous linear non-mass enhancement extending from the posterior-inferior margin of the known carcinoma to the inferior skin surface with questionable skin involvement. Additional irregular mass within the upper-outer quadrant of the left breast, at anterior depth, measuring 5 mm, suspicious for contralateral disease. No evidence of metastatic lymphadenopathy. She anticipates another biopsy soon for the 7m  lesion.    The patient gets annual mammograms; there was no abnormality reported last year. She has a history of left breast biopsy for benign disease at age 63  She is considering the option of neoadjuvant chemotherapy followed by lumpectomy, versus lumpectomy upfront, versus mastectomy.  She has a pending appointment with Dr. TIran Planas  She is most interested in breast conservation.  Oncotype results are pending.  She has an appointment today with Dr. GLindi Adie  PREVIOUS RADIATION THERAPY: No  PAST MEDICAL HISTORY:  has a past medical history of Anxiety, Arthritis, Common migraine with intractable migraine (04/06/2016), Diverticulosis, Elevated LFTs, Family history of premature CAD (09/01/2013), History of basal cell carcinoma excision, History of colitis, History of palpitations, History of thyroid nodule, IBS (irritable bowel syndrome), Migraine, MTHFR gene mutation ([place], PMB (postmenopausal bleeding), and Post-surgical hypothyroidism.    PAST SURGICAL HISTORY: Past Surgical History:  Procedure Laterality Date   APPENDECTOMY  as child   BREAST BIOPSY Left 1979   benign   BREAST EXCISIONAL BIOPSY Left    CARDIOVASCULAR STRESS TEST  09-16-2013   normal nuclear study w/ no ischemia/  normal LV function and wall motion , ef 84%   CESAREAN SECTION  1977 and 1978   Bilateral Tubal Ligation w/ last c/s   COLPOSCOPY  05/2009   CIN 1   HYSTEROSCOPY W/D&C N/A 12/23/2015   Procedure: DILATATION AND CURETTAGE /HYSTEROSCOPY ;  Surgeon: MMegan Salon MD;  [facility]  Service: Gynecology;  Laterality: N/A;   NEGATIVE SLEEP STUDY  2014  per pt   OOPHORECTOMY Right 1985   ruptured cyst    THYROID LOBECTOMY Right 03-08-2011   TRANSTHORACIC ECHOCARDIOGRAM  05-09-2011  ef 64%/  mild MR and TR    FAMILY HISTORY: family history includes Ehlers-Danlos syndrome in her mother; Fibromyalgia in her mother; Hyperlipidemia (age of onset: 7) in her mother; Hypertension  in her maternal grandmother; Hypertension (age of onset: 34) in her mother; Kidney disease in her mother; Lung cancer in her maternal grandmother; Lung cancer (age of onset: 51) in her mother; Migraines in her father and paternal grandmother; Osteoporosis in her father; Other in her father; Prostate cancer in her paternal grandfather; Stroke in her maternal grandfather; Uterine cancer in her maternal grandmother; Vascular Disease in her maternal grandmother.  SOCIAL HISTORY:  reports that she has never smoked. She has never used smokeless tobacco. She reports current alcohol use. She reports that she does not use drugs.  ALLERGIES: Codeine, Demerol [meperidine], Erythromycin, Sulfa antibiotics, and Ampicillin  MEDICATIONS:  Current Outpatient Medications  Medication Sig Dispense Refill   Ascorbic Acid (VITAMIN C) 1000 MG tablet Take 1,000 mg by mouth daily. 2 tablet daily     butalbital-aspirin-caffeine (FIORINAL) 50-325-40 MG capsule Take 1 capsule by mouth every 6 (six) hours as needed for headache. 8 capsule 0   Cholecalciferol (VITAMIN D3) 5000 UNITS CAPS Take 1 capsule by mouth daily.     cyclobenzaprine (FLEXERIL) 5 MG tablet Take 1 tablet by mouth daily as needed.      iodine-sodium iodide 2-2.4 % solution Apply topically once.     levothyroxine (SYNTHROID) 75 MCG tablet Take 75 mcg by mouth daily before breakfast.     Melatonin 3 MG TABS Take 3 mg by mouth at bedtime.      rizatriptan (MAXALT) 10 MG tablet Take 1 tablet (10 mg total) by mouth as needed for migraine. May repeat in 2 hours if needed 12 tablet 1   zinc gluconate 50 MG tablet Take 50 mg by mouth daily.     zolpidem (AMBIEN) 5 MG tablet Take 1 tablet (5 mg total) by mouth at bedtime as needed for sleep. (Patient not taking: Reported on 11/29/2018) 30 tablet 0   No current facility-administered medications for this encounter.     REVIEW OF SYSTEMS: As above   PHYSICAL EXAM:  vitals were not taken for this visit.     General: Alert and oriented, in no acute distress Psychiatric: Judgment and insight are intact. Affect is appropriate.  ECOG = 0  0 - Asymptomatic (Fully active, able to carry on all predisease activities without restriction)  1 - Symptomatic but completely ambulatory (Restricted in physically strenuous activity but ambulatory and able to carry out work of a light or sedentary nature. For example, light housework, office work)  2 - Symptomatic, <50% in bed during the day (Ambulatory and capable of all self care but unable to carry out any work activities. Up and about more than 50% of waking hours)  3 - Symptomatic, >50% in bed, but not bedbound (Capable of only limited self-care, confined to bed or chair 50% or more of waking hours)  4 - Bedbound (Completely disabled. Cannot carry on any self-care. Totally confined to bed or chair)  5 - Death   Eustace Pen MM, Creech RH, Tormey DC, et al. [phone]). "Toxicity and response criteria of the [facility]". [place] Oncol. 5 (6): 649-55   LABORATORY DATA:  Lab Results  Component Value Date   WBC 5.9 03/22/2017   HGB 14.4 03/22/2017   HCT 43.3 03/22/2017   MCV 97.8 03/22/2017   PLT 210.0 03/22/2017   CMP  Component Value Date/Time   NA 138 09/26/2017 1030   K 4.3 09/26/2017 1030   CL 101 09/26/2017 1030   CO2 28 09/26/2017 1030   GLUCOSE 77 09/26/2017 1030   BUN 12 09/26/2017 1030   CREATININE 0.55 09/26/2017 1030   CREATININE 0.67 11/01/2015 1100   CALCIUM 9.2 09/26/2017 1030   PROT 6.6 09/26/2017 1030   ALBUMIN 4.1 09/26/2017 1030   AST 24 09/26/2017 1030   ALT 37 (H) 09/26/2017 1030   ALKPHOS 86 09/26/2017 1030   BILITOT 0.5 09/26/2017 1030   GFRNONAA >60 03/02/2011 1102   GFRAA >60 03/02/2011 1102         RADIOGRAPHY: Mr Breast Bilateral W Wo Contrast Inc Cad  Result Date: 12/18/2018 CLINICAL DATA:  Recent diagnosis of invasive ductal carcinoma of the RIGHT breast, 5:30 o'clock. Associated  skin retraction. History of LEFT breast cyst removal in 1980. LABS:  Creatinine 0.7, GFR 91 EXAM: BILATERAL BREAST MRI WITH AND WITHOUT CONTRAST TECHNIQUE: Multiplanar, multisequence MR images of both breasts were obtained prior to and following the intravenous administration of 5 ml of Gadavist Three-dimensional MR images were rendered by post-processing of the original MR data on an independent workstation. The three-dimensional MR images were interpreted, and findings are reported in the following complete MRI report for this study. Three dimensional images were evaluated at the independent DynaCad workstation COMPARISON:  No previous breast MRI. Comparison is made to diagnostic mammogram and ultrasound dated 12/05/2018, ultrasound biopsy dated 12/09/2018 and post procedure mammogram dated 12/09/2018 FINDINGS: Breast composition: b. Scattered fibroglandular tissue. Background parenchymal enhancement: Minimal Right breast: Spiculated mass within the lower RIGHT breast, 6 o'clock axis region, at middle depth, measuring 2.3 x 2.3 x 1.3 cm, with associated biopsy clip artifact, corresponding with patient's known biopsy-proven invasive ductal carcinoma (series 9, image 113). Contiguous linear non-mass enhancement from the posterior-inferior margin of the known carcinoma to the inferior skin surface with questionable skin involvement (series 9, images 119-121). No additional suspicious enhancing masses, non-mass enhancement or secondary signs of malignancy within the RIGHT breast. Left breast: 5 mm irregular mass within the upper-outer quadrant of the LEFT breast, at anterior depth, with sub threshold kinetics (series 9, image 70). Lymph nodes: No abnormal appearing lymph nodes. Ancillary findings:  None. IMPRESSION: 1. Known biopsy-proven invasive carcinoma within the lower RIGHT breast, 6 o'clock axis region, measuring 2.3 cm, with associated biopsy clip. Additional contiguous linear non-mass enhancement extending  from the posterior-inferior margin of the known carcinoma to the inferior skin surface with questionable skin involvement. 2. Additional irregular mass within the upper-outer quadrant of the LEFT breast, at anterior depth, measuring 5 mm, with subthreshold kinetics but suspicious for contralateral disease. MRI-guided biopsy is recommended. 3. No evidence of metastatic lymphadenopathy. RECOMMENDATION: MRI-guided biopsy for the irregular mass within the upper-outer quadrant of the LEFT breast, at anterior depth, measuring 5 mm, suspicious for contralateral disease. BI-RADS CATEGORY  4: Suspicious. Electronically Signed   By: Franki Cabot M.D.   On: 12/18/2018 16:14   US Breast Ltd Uni Right Inc Axilla  Result Date: 12/05/2018 CLINICAL DATA:  63 year old female with a right breast palpable lump for approximately 2 months. EXAM: DIGITAL DIAGNOSTIC BILATERAL MAMMOGRAM WITH CAD AND TOMO ULTRASOUND BILATERAL BREAST COMPARISON:  Previous exam(s). ACR Breast Density Category b: There are scattered areas of fibroglandular density. FINDINGS: A radiopaque BB was placed at the site of the patient's palpable lump in the lower inner right breast. Focal distortion is seen deep to the radiopaque BB.  No additional suspicious findings are identified within the remainder of the right breast or within the left breast. Mammographic images were processed with CAD. On physical exam, I am able to palpate a firm, fixed lump in the lower inner right breast. It spans approximately 3 cm to palpation. Targeted ultrasound is performed, showing an irregular hypoechoic mass with spiculated margins at the 5:30 position 3 cm from the nipple. It measures 2.1 x 2.0 x 1.8 cm. Note is made of a feeding vessel. Evaluation of the right axilla demonstrates no suspicious lymphadenopathy. IMPRESSION: 1. Suspicious right breast mass corresponding with the patient's palpable lump. Recommendation is for ultrasound-guided biopsy. 2. No suspicious right  axillary lymphadenopathy. 3. No mammographic evidence of malignancy on the left. RECOMMENDATION: Ultrasound-guided biopsy of the right breast. I have discussed the findings and recommendations with the patient. Results were also provided in writing at the conclusion of the visit. If applicable, a reminder letter will be sent to the patient regarding the next appointment. BI-RADS CATEGORY  5: Highly suggestive of malignancy. Electronically Signed   By: Kristopher Oppenheim M.D.   On: 12/05/2018 09:46   Mm Diag Breast Tomo Bilateral  Result Date: 12/05/2018 CLINICAL DATA:  63 year old female with a right breast palpable lump for approximately 2 months. EXAM: DIGITAL DIAGNOSTIC BILATERAL MAMMOGRAM WITH CAD AND TOMO ULTRASOUND BILATERAL BREAST COMPARISON:  Previous exam(s). ACR Breast Density Category b: There are scattered areas of fibroglandular density. FINDINGS: A radiopaque BB was placed at the site of the patient's palpable lump in the lower inner right breast. Focal distortion is seen deep to the radiopaque BB. No additional suspicious findings are identified within the remainder of the right breast or within the left breast. Mammographic images were processed with CAD. On physical exam, I am able to palpate a firm, fixed lump in the lower inner right breast. It spans approximately 3 cm to palpation. Targeted ultrasound is performed, showing an irregular hypoechoic mass with spiculated margins at the 5:30 position 3 cm from the nipple. It measures 2.1 x 2.0 x 1.8 cm. Note is made of a feeding vessel. Evaluation of the right axilla demonstrates no suspicious lymphadenopathy. IMPRESSION: 1. Suspicious right breast mass corresponding with the patient's palpable lump. Recommendation is for ultrasound-guided biopsy. 2. No suspicious right axillary lymphadenopathy. 3. No mammographic evidence of malignancy on the left. RECOMMENDATION: Ultrasound-guided biopsy of the right breast. I have discussed the findings and  recommendations with the patient. Results were also provided in writing at the conclusion of the visit. If applicable, a reminder letter will be sent to the patient regarding the next appointment. BI-RADS CATEGORY  5: Highly suggestive of malignancy. Electronically Signed   By: Kristopher Oppenheim M.D.   On: 12/05/2018 09:46   Mm Clip Placement Right  Result Date: 12/09/2018 CLINICAL DATA:  Status post ultrasound-guided core needle biopsy of a 2.1 cm mass in the 5:30 o'clock position of the right breast EXAM: DIAGNOSTIC RIGHT MAMMOGRAM POST ULTRASOUND BIOPSY COMPARISON:  Previous exam(s). FINDINGS: Mammographic images were obtained following ultrasound guided biopsy of the recently demonstrated 2.1 cm mass in the 5:30 o'clock position of the right breast. These demonstrate a ribbon shaped biopsy marker clip at the lateral edge of the irregular biopsied mass in the 5:30 o'clock position of the right breast. This is located 1 cm lateral to the center of the biopsied mass. IMPRESSION: Ribbon shaped biopsy marker clip 1 cm lateral to the center of the irregular biopsied mass in the 5:30 o'clock position of  the right breast. Final Assessment: Post Procedure Mammograms for Marker Placement Electronically Signed   By: Claudie Revering M.D.   On: 12/09/2018 15:57   [place] Rt Breast Bx W Loc Dev 1st Lesion Img Bx Spec US Guide  Addendum Date: 12/11/2018   ADDENDUM REPORT: 12/11/2018 08:20 ADDENDUM: Pathology revealed GRADE II INVASIVE DUCTAL CARCINOMA, of the RIGHT breast, 5:30 o'clock. This was found to be concordant by Dr. Claudie Revering. Pathology results were discussed with the patient by telephone. The patient reported doing well after the biopsy with tenderness at the site. Post biopsy instructions and care were reviewed and questions were answered. The patient was encouraged to call [facility] for any additional concerns. Surgical consultation has been arranged with Dr. Fanny Skates at [facility] on December 16, 2018. Pathology results reported by Stacie Acres, RN on 12/11/2018. Electronically Signed   By: Claudie Revering M.D.   On: 12/11/2018 08:20   Result Date: 12/11/2018 CLINICAL DATA:  2.1 cm mass in the 5:30 o'clock position of the right breast with recent imaging features highly suspicious for malignancy. EXAM: ULTRASOUND GUIDED RIGHT BREAST CORE NEEDLE BIOPSY COMPARISON:  Previous exam(s). FINDINGS: I met with the patient and we discussed the procedure of ultrasound-guided biopsy, including benefits and alternatives. We discussed the high likelihood of a successful procedure. We discussed the risks of the procedure, including infection, bleeding, tissue injury, clip migration, and inadequate sampling. Informed written consent was given. The usual time-out protocol was performed immediately prior to the procedure. Lesion quadrant: Lower inner quadrant Using sterile technique and 1% Lidocaine as local anesthetic, under direct ultrasound visualization, a 12 gauge spring-loaded device was used to perform biopsy of the recently demonstrated 2.1 cm mass in the 5:30 o'clock position of the right breast using an inferomedial approach. At the conclusion of the procedure a ribbon shaped tissue marker clip was deployed into the biopsy cavity. Follow up 2 view mammogram was performed and dictated separately. IMPRESSION: Ultrasound guided biopsy of a 2.1 cm mass in the 5:30 o'clock position of the right breast. No apparent complications. Electronically Signed: By: Claudie Revering M.D. On: 12/09/2018 15:33      IMPRESSION/PLAN: Right Breast Cancer  It was a pleasure meeting the patient today.  She had many good questions today that I answered to the best of my ability. We discussed the risks, benefits, and side effects of radiotherapy.  We discussed the differences in brachytherapy and whole breast radiotherapy and I explained why I would recommend whole breast radiotherapy for her.  Assuming she  pursues breast conservation, I recommend radiotherapy to the right breast to reduce her risk of locoregional recurrence by 2/3.  We discussed that radiation would take approximately 4 weeks to complete and that I would give the patient a few weeks to heal following surgery before starting treatment planning.  We spoke about acute effects including skin irritation and fatigue as well as much less common late effects including internal organ injury or irritation. We spoke about the latest technology that is used to minimize the risk of late effects for patients undergoing radiotherapy to the breast or chest wall. No guarantees of treatment were given. The patient is enthusiastic about proceeding with treatment. I look forward to participating in the patient's care.  I will await her referral back to me for postoperative follow-up and eventual CT simulation/treatment planning.  Of note I advised her to stop her vitamin C supplements during the radiation course out  of caution, to allow oxidative damage to occur to the cancer cells.  She can continue to eat foods that are rich naturally in vitamin C and other antioxidants.   This encounter was provided by telemedicine platform Webex.  The patient has given verbal consent for this type of encounter and has been advised to only accept a meeting of this type in a secure network environment. The time spent during this encounter was 47 minutes. The attendants for this meeting include Eppie Gibson  and Laureen Ochs.  During the encounter, Eppie Gibson was located at [facility] Radiation Oncology Department.  Eisa Conaway was located at home.     __________________________________________   Eppie Gibson, MD  This document serves as a record of services personally performed by Eppie Gibson, MD. It was created on her behalf by Rae Lips, a trained medical scribe. The creation of this record is based on the scribe's personal observations  and the provider's statements to them. This document has been checked and approved by the attending provider.

## 2018-12-25 ENCOUNTER — Encounter: Payer: Self-pay | Admitting: *Deleted

## 2018-12-25 DIAGNOSIS — C50311 Malignant neoplasm of lower-inner quadrant of right female breast: Secondary | ICD-10-CM | POA: Diagnosis not present

## 2018-12-25 DIAGNOSIS — Z170 Estrogen receptor positive status [ER+]: Secondary | ICD-10-CM | POA: Diagnosis not present

## 2018-12-26 ENCOUNTER — Telehealth: Payer: Self-pay | Admitting: Obstetrics & Gynecology

## 2018-12-26 NOTE — Telephone Encounter (Signed)
Patient noticed a little vaginal bleeding and was told to call if this happened. She wonders if this is due to the breast cancer medication that she started taking?

## 2018-12-26 NOTE — Telephone Encounter (Signed)
Spoke with patient. She states having lower pelvic cramping which reminded her of how she felt last time she had PMB vs UTI. No other urinary symptoms, so she placed tampon in vagina and when she removed it, tampon with bright red blood--not saturated but not a small amount. Dx'd with Rt.Br.Ca 12-09-18 and just began Arimidex today. Made appt. 12/30/18 10:00am with Dr.Miller for evaluation.

## 2018-12-30 ENCOUNTER — Other Ambulatory Visit (HOSPITAL_COMMUNITY)
Admission: RE | Admit: 2018-12-30 | Discharge: 2018-12-30 | Disposition: A | Discharge status: Home / Self Care | Payer: Medicare Other | Source: Ambulatory Visit | Attending: Obstetrics & Gynecology | Admitting: Obstetrics & Gynecology

## 2018-12-30 ENCOUNTER — Encounter: Payer: Self-pay | Admitting: Obstetrics & Gynecology

## 2018-12-30 ENCOUNTER — Ambulatory Visit (INDEPENDENT_AMBULATORY_CARE_PROVIDER_SITE_OTHER): Payer: Medicare Other | Admitting: Obstetrics & Gynecology

## 2018-12-30 ENCOUNTER — Other Ambulatory Visit: Payer: Self-pay

## 2018-12-30 VITALS — BP 132/78 | HR 62 | Resp 14 | Ht 58.5 in | Wt 108.2 lb

## 2018-12-30 DIAGNOSIS — Z124 Encounter for screening for malignant neoplasm of cervix: Secondary | ICD-10-CM | POA: Diagnosis not present

## 2018-12-30 DIAGNOSIS — C50311 Malignant neoplasm of lower-inner quadrant of right female breast: Secondary | ICD-10-CM | POA: Diagnosis not present

## 2018-12-30 DIAGNOSIS — Z79899 Other long term (current) drug therapy: Secondary | ICD-10-CM | POA: Diagnosis not present

## 2018-12-30 DIAGNOSIS — N950 Postmenopausal bleeding: Secondary | ICD-10-CM | POA: Diagnosis not present

## 2018-12-30 DIAGNOSIS — Z170 Estrogen receptor positive status [ER+]: Secondary | ICD-10-CM | POA: Diagnosis not present

## 2018-12-30 MED ORDER — LEVOTHYROXINE SODIUM 75 MCG PO TABS: 75.0000 ug | ORAL_TABLET | Freq: Every day | ORAL | 1 refills | Status: DC

## 2018-12-30 NOTE — Progress Notes (Addendum)
GYNECOLOGY  VISIT  CC:  PMB   HPI: 63 y.o. G2P2 Married Declined female here complaint of vaginal bleeding.  She was recently diagnosed with breast cancer, ER/PR positive, her-2 negative.  Oncotype testing is pending.  She is currently on Anastozole.  She was monitoring to see if she had any baseline bleeding by inserting a tampon.  She said that tampon was hard to remove and it had blood on it. She states that she bleed for the rest of that day.  Patient states that she is no longer bleeding.   She did have an endometrial biopsy 12/23/15.   PUS 9/18   GYNECOLOGIC HISTORY: Patient's last menstrual period was 08/25/2010. Contraception: none Menopausal hormone therapy: none  Patient Active Problem List   Diagnosis Date Noted  . 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 Hypothyroid 11/11/2018  . Memory change 11/13/2016  . Common migraine with intractable migraine 04/06/2016  . H/O partial thyroidectomy 01/14/2014  . Family history of premature CAD 09/01/2013  . History of migraine headaches 02/21/2013  . Hyperlipidemia 02/21/2013  . Thyroid function study abnormality 02/21/2013    Past Medical History:  Diagnosis Date  . Anxiety   . Arthritis   . Common migraine with intractable migraine 04/06/2016  . Diverticulosis   . Elevated LFTs    monitored by pcp per pt --  unknown etiology  . Family history of premature CAD 09/01/2013  . History of basal cell carcinoma excision    2004   &  2014  . History of colitis   . History of palpitations   . 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 IBS (irritable bowel syndrome)   . Migraine   . MTHFR gene mutation ([place])   . PMB (postmenopausal bleeding)   . Post-surgical hypothyroidism     Past Surgical History:  Procedure Laterality Date  . APPENDECTOMY  as child  . BREAST BIOPSY Left 1979   benign  . BREAST EXCISIONAL BIOPSY Left   . CARDIOVASCULAR STRESS TEST  09-16-2013   normal nuclear study w/ no ischemia/  normal LV function and wall motion , ef 84%  . CESAREAN SECTION  1977 and 1978   Bilateral Tubal Ligation w/ last c/s  . COLPOSCOPY  05/2009   CIN 1  . HYSTEROSCOPY W/D&C N/A 12/23/2015   Procedure: DILATATION AND CURETTAGE /HYSTEROSCOPY ;  Surgeon: Megan Salon, MD;  Location: [facility];  Service: Gynecology;  Laterality: N/A;  . NEGATIVE SLEEP STUDY  2014  per pt  . OOPHORECTOMY Right 1985   ruptured cyst   . THYROID LOBECTOMY Right 03-08-2011  . TRANSTHORACIC ECHOCARDIOGRAM  05-09-2011   ef 64%/  mild MR and TR    MEDS:   Current Outpatient Medications on File Prior to Visit  Medication Sig Dispense Refill  . anastrozole (ARIMIDEX) 1 MG tablet Take 1 tablet (1 mg total) by mouth daily. 90 tablet 3  . Ascorbic Acid (VITAMIN C) 1000 MG tablet Take 1,000 mg by mouth daily. 2 tablet daily    . butalbital-aspirin-caffeine (FIORINAL) 50-325-40 MG capsule Take 1 capsule by mouth every 6 (six) hours as needed for headache. 8 capsule 0  . Cholecalciferol (VITAMIN D3) 5000 UNITS CAPS Take 1 capsule by mouth daily.    . cyclobenzaprine (FLEXERIL) 5 MG tablet Take 1 tablet by mouth daily as needed.     Marland Kitchen  iodine-sodium iodide 2-2.4 % solution Apply topically once.    Marland Kitchen levothyroxine (SYNTHROID) 75 MCG tablet Take 75 mcg by mouth daily before breakfast.    . Melatonin 3 MG TABS Take 3 mg by mouth at bedtime.     . rizatriptan (MAXALT) 10 MG tablet Take 1 tablet (10 mg total) by mouth as needed for migraine. May repeat in 2 hours if needed 12 tablet 1  . zinc gluconate 50 MG tablet Take 50 mg by mouth daily.    Marland Kitchen zolpidem (AMBIEN) 5 MG tablet Take 1 tablet (5 mg total) by mouth at bedtime as needed for sleep. 30 tablet 0   No current facility-administered medications on file prior to visit.     ALLERGIES: Codeine, Demerol [meperidine], Erythromycin, Sulfa  antibiotics, and Ampicillin  Family History  Problem Relation Age of Onset  . Hypertension Maternal Grandmother   . Uterine cancer Maternal Grandmother   . Vascular Disease Maternal Grandmother   . Lung cancer Maternal Grandmother   . Stroke Maternal Grandfather   . Prostate cancer Paternal Grandfather   . Migraines Paternal Grandmother   . Lung cancer Mother 62  . Hypertension Mother 53  . Hyperlipidemia Mother 88  . Ehlers-Danlos syndrome Mother   . Kidney disease Mother   . Fibromyalgia Mother   . Other Father        bypass surgery times 2  . Migraines Father   . Osteoporosis Father     SH:  Married, non smoking  Review of Systems  All other systems reviewed and are negative.   PHYSICAL EXAMINATION:    BP 132/78   Pulse 62   Resp 14   Ht 4' 10.5" (1.486 m)   Wt 108 lb 3.2 oz (49.1 kg)   LMP 08/25/2010   BMI 22.23 kg/m     General appearance: alert, cooperative and appears stated age Abdomen: soft, non-tender; bowel sounds normal; no masses,  no organomegaly Lymph:  no inguinal LAD noted  Pelvic: External genitalia:  no lesions              Urethra:  normal appearing urethra with no masses, tenderness or lesions              Bartholins and Skenes: normal                 Vagina: normal appearing vagina with normal color and discharge, no lesions              Cervix: no lesions              Bimanual Exam:  Uterus:  normal size, contour, position, consistency, mobility, non-tender              Adnexa: no mass, fullness, tenderness              Anus:  Small, perirectal sebaceous cyst  Endometrial biopsy recommended.  Discussed with patient.  Verbal and written consent obtained.   Procedure:  Speculum placed.  Cervix visualized and cleansed with betadine prep.  A single toothed tenaculum was applied to the anterior lip of the cervix.  Endometrial pipelle was advanced through the cervix into the endometrial cavity without difficulty.  Pipelle passed to 5.5cm.  Suction  applied and pipelle removed with scant tissue sample obtained.  Second pass performed.  Tenculum removed.  No bleeding noted.  Patient tolerated procedure well.  Chaperone was present for exam.  Assessment: PMP bleeding Breast cancer Osteoporosis  Plan: Endometrial biopsy and  pap smear obtained. CMP obtained today.  She will be starting Fosamax after having dental work. RF for levothyroxine 63mg daily.  #30/1RF.  Will have lab work done in 2 months

## 2018-12-30 NOTE — Addendum Note (Signed)
Addended by: Megan Salon on: 12/30/2018 11:27 AM   Modules accepted: Orders

## 2018-12-31 ENCOUNTER — Encounter (HOSPITAL_COMMUNITY): Payer: Self-pay | Admitting: General Surgery

## 2018-12-31 LAB — COMPREHENSIVE METABOLIC PANEL
ALT: 31 IU/L (ref 0–32)
AST: 19 IU/L (ref 0–40)
Albumin/Globulin Ratio: 2.9 — ABNORMAL HIGH (ref 1.2–2.2)
Albumin: 4.7 g/dL (ref 3.8–4.8)
Alkaline Phosphatase: 71 IU/L (ref 39–117)
BUN/Creatinine Ratio: 11 — ABNORMAL LOW (ref 12–28)
BUN: 7 mg/dL — ABNORMAL LOW (ref 8–27)
Bilirubin Total: 0.2 mg/dL (ref 0.0–1.2)
CO2: 24 mmol/L (ref 20–29)
Calcium: 9.3 mg/dL (ref 8.7–10.3)
Chloride: 100 mmol/L (ref 96–106)
Creatinine, Ser: 0.66 mg/dL (ref 0.57–1.00)
GFR calc Af Amer: 107 mL/min/{1.73_m2} (ref >59)
GFR calc non Af Amer: 93 mL/min/{1.73_m2} (ref >59)
Globulin, Total: 1.6 g/dL (ref 1.5–4.5)
Glucose: 84 mg/dL (ref 65–99)
Potassium: 3.8 mmol/L (ref 3.5–5.2)
Sodium: 140 mmol/L (ref 134–144)
Total Protein: 6.3 g/dL (ref 6.0–8.5)

## 2019-01-01 ENCOUNTER — Ambulatory Visit
Admission: RE | Admit: 2019-01-01 | Discharge: 2019-01-01 | Disposition: A | Discharge status: Home / Self Care | Payer: Medicare Other | Source: Ambulatory Visit | Attending: General Surgery | Admitting: General Surgery

## 2019-01-01 ENCOUNTER — Other Ambulatory Visit: Payer: Self-pay

## 2019-01-01 DIAGNOSIS — R9389 Abnormal findings on diagnostic imaging of other specified body structures: Secondary | ICD-10-CM

## 2019-01-01 DIAGNOSIS — D242 Benign neoplasm of left breast: Secondary | ICD-10-CM | POA: Diagnosis not present

## 2019-01-01 DIAGNOSIS — N6321 Unspecified lump in the left breast, upper outer quadrant: Secondary | ICD-10-CM | POA: Diagnosis not present

## 2019-01-01 MED ORDER — GADOBUTROL 1 MMOL/ML IV SOLN
5.0000 mL | Freq: Once | INTRAVENOUS | Status: AC | PRN
  Administered 2019-01-01: 5 mL via INTRAVENOUS

## 2019-01-02 ENCOUNTER — Other Ambulatory Visit: Payer: Self-pay | Admitting: General Surgery

## 2019-01-02 DIAGNOSIS — Z90721 Acquired absence of ovaries, unilateral: Secondary | ICD-10-CM | POA: Diagnosis not present

## 2019-01-02 DIAGNOSIS — C50311 Malignant neoplasm of lower-inner quadrant of right female breast: Secondary | ICD-10-CM | POA: Diagnosis not present

## 2019-01-02 DIAGNOSIS — Z9079 Acquired absence of other genital organ(s): Secondary | ICD-10-CM | POA: Diagnosis not present

## 2019-01-02 DIAGNOSIS — Z170 Estrogen receptor positive status [ER+]: Secondary | ICD-10-CM

## 2019-01-02 DIAGNOSIS — K589 Irritable bowel syndrome without diarrhea: Secondary | ICD-10-CM | POA: Diagnosis not present

## 2019-01-02 DIAGNOSIS — Z9889 Other specified postprocedural states: Secondary | ICD-10-CM | POA: Diagnosis not present

## 2019-01-02 DIAGNOSIS — D242 Benign neoplasm of left breast: Secondary | ICD-10-CM | POA: Diagnosis not present

## 2019-01-02 DIAGNOSIS — C50111 Malignant neoplasm of central portion of right female breast: Secondary | ICD-10-CM

## 2019-01-02 DIAGNOSIS — Z9049 Acquired absence of other specified parts of digestive tract: Secondary | ICD-10-CM | POA: Diagnosis not present

## 2019-01-02 DIAGNOSIS — Z98891 History of uterine scar from previous surgery: Secondary | ICD-10-CM | POA: Diagnosis not present

## 2019-01-03 ENCOUNTER — Other Ambulatory Visit: Payer: Self-pay | Admitting: General Surgery

## 2019-01-03 DIAGNOSIS — Z170 Estrogen receptor positive status [ER+]: Secondary | ICD-10-CM

## 2019-01-03 DIAGNOSIS — C50111 Malignant neoplasm of central portion of right female breast: Secondary | ICD-10-CM

## 2019-01-07 ENCOUNTER — Other Ambulatory Visit: Payer: Self-pay | Admitting: *Deleted

## 2019-01-07 DIAGNOSIS — N950 Postmenopausal bleeding: Secondary | ICD-10-CM

## 2019-01-09 ENCOUNTER — Other Ambulatory Visit: Payer: Self-pay | Admitting: General Surgery

## 2019-01-09 DIAGNOSIS — C50311 Malignant neoplasm of lower-inner quadrant of right female breast: Secondary | ICD-10-CM

## 2019-01-14 ENCOUNTER — Other Ambulatory Visit: Payer: Medicare Other

## 2019-01-14 ENCOUNTER — Other Ambulatory Visit: Payer: Medicare Other | Admitting: Obstetrics & Gynecology

## 2019-01-14 ENCOUNTER — Other Ambulatory Visit: Payer: Self-pay

## 2019-01-14 ENCOUNTER — Inpatient Hospital Stay: Payer: Medicare Other | Attending: Hematology and Oncology | Admitting: Hematology and Oncology

## 2019-01-14 ENCOUNTER — Telehealth: Payer: Self-pay | Admitting: *Deleted

## 2019-01-14 DIAGNOSIS — Z170 Estrogen receptor positive status [ER+]: Secondary | ICD-10-CM | POA: Diagnosis not present

## 2019-01-14 DIAGNOSIS — C50311 Malignant neoplasm of lower-inner quadrant of right female breast: Secondary | ICD-10-CM

## 2019-01-14 NOTE — Progress Notes (Signed)
Patient Care Team: Carollee Herter, Alferd Apa, DO as PCP - General (Family Medicine) Orie Rout, MD as Referring Physician (Specialist) Elveria Rising, MD as Consulting Physician (Obstetrics and Gynecology) Izora Gala, MD as Consulting Physician (Otolaryngology) Megan Salon, MD as Consulting Physician (Gynecology) Mauro Kaufmann, RN as Oncology Nurse Navigator Rockwell Germany, RN as Oncology Nurse Navigator  This is a Doximity video visit.  Patient's identifiers were verified.  DIAGNOSIS:  Encounter Diagnosis  Name Primary?  . 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, estrogen receptor positive ([place])  12/24/2018 Initial Diagnosis   Palpable lump in the right breast, 2.1 cm, biopsy revealed grade 2 invasive ductal carcinoma that was ER 100%, PR 50%, Ki-67 50%, HER-2 negative, 2+ by IHC FISH ratio 1.27, T2N0 stage IB   12/24/2018 Cancer Staging   Staging form: Breast, AJCC 8th Edition - Clinical: Stage IB (cT2, cN0, cM0, G2, ER+, PR+, HER2-) - Signed by Eppie Gibson, MD on 12/24/2018     CHIEF COMPLIANT: Follow-up to discuss treatment plan on neoadjuvant anastrozole which started 12/31/2018  INTERVAL HISTORY: Kimberly Miles is a 24-year with above-mentioned history of stage Ib right breast cancer who is currently on neoadjuvant antiestrogen therapy with anastrozole.  Original plan was to treat her for 6 months and then perform surgery to shrink the tumor.  However she and her husband had a change of heart and want to proceed with surgery right away.  She is getting very anxious about the fact that the cancer might be growing while she is waiting.  She does not want to wait any longer.  She has not had any problems tolerating anastrozole.  REVIEW OF SYSTEMS:   Constitutional: Denies fevers, chills or abnormal  weight loss Eyes: Denies blurriness of vision Ears, nose, mouth, throat, and face: Denies mucositis or sore throat Respiratory: Denies cough, dyspnea or wheezes Cardiovascular: Denies palpitation, chest discomfort Gastrointestinal:  Denies nausea, heartburn or change in bowel habits Skin: Denies abnormal skin rashes Lymphatics: Denies new lymphadenopathy or easy bruising Neurological:Denies numbness, tingling or new weaknesses Behavioral/Psych: Mood is stable, no new changes  Extremities: No lower extremity edema Breast:  denies any pain or lumps or nodules in either breasts All other systems were reviewed with the patient and are negative.  I have reviewed the past medical history, past surgical history, social history and family history with the patient and they are unchanged from previous note.  ALLERGIES:  is allergic to codeine; demerol [meperidine]; erythromycin; sulfa antibiotics; and ampicillin.  MEDICATIONS:  Current Outpatient Medications  Medication Sig Dispense Refill  . anastrozole (ARIMIDEX) 1 MG tablet Take 1 tablet (1 mg total) by mouth daily. 90 tablet 3  . Ascorbic Acid (VITAMIN C) 1000 MG tablet Take 1,000 mg by mouth daily. 2 tablet daily    . butalbital-aspirin-caffeine (FIORINAL) 50-325-40 MG capsule Take 1 capsule by mouth every 6 (six) hours as needed for headache. 8 capsule 0  . Cholecalciferol (VITAMIN D3) 5000 UNITS CAPS Take 1 capsule by mouth daily.    . cyclobenzaprine (FLEXERIL) 5 MG tablet Take 1 tablet by mouth daily as needed.     . iodine-sodium iodide 2-2.4 % solution Apply topically once.    Marland Kitchen levothyroxine (SYNTHROID) 75 MCG tablet Take 1 tablet (75 mcg total) by mouth daily before breakfast. 30 tablet 1  . Melatonin 3  MG TABS Take 3 mg by mouth at bedtime.     . rizatriptan (MAXALT) 10 MG tablet Take 1 tablet (10 mg total) by mouth as needed for migraine. May repeat in 2 hours if needed 12 tablet 1  . zinc gluconate 50 MG tablet Take 50 mg by mouth  daily.    Marland Kitchen zolpidem (AMBIEN) 5 MG tablet Take 1 tablet (5 mg total) by mouth at bedtime as needed for sleep. 30 tablet 0   No current facility-administered medications for this visit.     PHYSICAL EXAMINATION: ECOG PERFORMANCE STATUS: 1 - Symptomatic but completely ambulatory   LABORATORY DATA:  I have reviewed the data as listed CMP Latest Ref Rng & Units 12/30/2018 09/26/2017 03/22/2017  Glucose 65 - 99 mg/dL 84 77 97  BUN 8 - 27 mg/dL 7(L) 12 10  Creatinine 0.57 - 1.00 mg/dL 0.66 0.55 0.69  Sodium 134 - 144 mmol/L 140 138 140  Potassium 3.5 - 5.2 mmol/L 3.8 4.3 4.1  Chloride 96 - 106 mmol/L 100 101 105  CO2 20 - 29 mmol/L 24 28 30   Calcium 8.7 - 10.3 mg/dL 9.3 9.2 8.9  Total Protein 6.0 - 8.5 g/dL 6.3 6.6 6.1  Total Bilirubin 0.0 - 1.2 mg/dL 0.2 0.5 0.4  Alkaline Phos 39 - 117 IU/L 71 86 72  AST 0 - 40 IU/L 19 24 28   ALT 0 - 32 IU/L 31 37(H) 47(H)    Lab Results  Component Value Date   WBC 5.9 03/22/2017   HGB 14.4 03/22/2017   HCT 43.3 03/22/2017   MCV 97.8 03/22/2017   PLT 210.0 03/22/2017   NEUTROABS 4.0 03/22/2017    ASSESSMENT & PLAN:  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, estrogen receptor positive ([place]) 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%, Ki-67 50%, HER-2 negative, 2+ by IHC FISH ratio 1.27, T2N0 stage IB  Breast MRI: Spiculated mass in the lower right breast 2.3 x 2.3 x 1.3 cm with linear non-mass enhancement extending from posterior inferior margin all the way to the skin surface, 5 mm irregular mass UOQ left breast  Oncotype DX score: 18: Distant recurrence at 9 years: 5% Current treatment: Neoadjuvant antiestrogen therapy with anastrozole started 12/31/2018 Anastrozole toxicities: None  Plan: Patient called me today to discuss her surgical plan.  She does not want to wait on neoadjuvant anastrozole any further.  She is getting very anxious about  the fact that she yet did not have surgery and she is imagining all the worse case scenarios.  She and her husband want to proceed with surgery. I will request Dr. Dalbert Batman to contact her to plan for lumpectomy as soon as is feasible. I will see her 1 week after surgery with the Doximity video visit to go over the final pathology results.    No orders of the defined types were placed in this encounter.  The patient has a good understanding of the overall plan. she agrees with it. she will call with any problems that may develop before the next visit here.   Harriette Ohara, MD 01/14/19

## 2019-01-14 NOTE — Assessment & Plan Note (Signed)
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%, Ki-67 50%, HER-2 negative, 2+ by IHC FISH ratio 1.27, T2N0 stage IB  Breast MRI: Spiculated mass in the lower right breast 2.3 x 2.3 x 1.3 cm with linear non-mass enhancement extending from posterior inferior margin all the way to the skin surface, 5 mm irregular mass UOQ left breast  Oncotype DX score: 18: Distant recurrence at 9 years: 5% Current treatment: Neoadjuvant antiestrogen therapy with anastrozole started 12/31/2018 Anastrozole toxicities: None  Plan: Patient called me today to discuss her surgical plan.  She does not want to wait on neoadjuvant anastrozole any further.  She is getting very anxious about the fact that she yet did not have surgery and she is imagining all the worse case scenarios.  She and her husband want to proceed with surgery. I will request Dr. Dalbert Batman to contact her to plan for lumpectomy as soon as is feasible. I will see her 1 week after surgery with the Doximity video visit to go over the final pathology results.

## 2019-01-14 NOTE — Telephone Encounter (Signed)
Received call from pt stating she wanted to speak with [name] and get his opinion on several area's including her oncotype score, anti estrogen therapy, and surgery options/date.  Doximity apt made for today at noon for pt to talk with [name] and have all questions answered.  Pt very appreciative of apt.

## 2019-01-15 ENCOUNTER — Ambulatory Visit (INDEPENDENT_AMBULATORY_CARE_PROVIDER_SITE_OTHER): Payer: Medicare Other | Admitting: Family Medicine

## 2019-01-15 ENCOUNTER — Encounter: Payer: Self-pay | Admitting: Family Medicine

## 2019-01-15 DIAGNOSIS — Z170 Estrogen receptor positive status [ER+]: Secondary | ICD-10-CM | POA: Diagnosis not present

## 2019-01-15 DIAGNOSIS — C50311 Malignant neoplasm of lower-inner quadrant of right female breast: Secondary | ICD-10-CM | POA: Diagnosis not present

## 2019-01-15 DIAGNOSIS — R1013 Epigastric pain: Secondary | ICD-10-CM | POA: Diagnosis not present

## 2019-01-15 DIAGNOSIS — E039 Hypothyroidism, unspecified: Secondary | ICD-10-CM

## 2019-01-15 NOTE — Assessment & Plan Note (Signed)
Surgery next month Per surgery and oncology

## 2019-01-15 NOTE — Progress Notes (Signed)
Virtual Visit via Video Note  I connected with Kimberly Miles on 01/15/19 at  9:00 AM EDT by a video enabled telemedicine application and verified that I am speaking with the correct person using two identifiers.  Location: Patient: home  Provider: home    I discussed the limitations of evaluation and management by telemedicine and the availability of in person appointments. The patient expressed understanding and agreed to proceed.  History of Present Illness: Pt is home c/o pulling sensation in her upper abd x was worse with food but now the symptoms have resolved  No further issues.   She ate more fiber and etc and started having reg BM and pain went away.  She does admit to a lot more stress due to breast CA.   Pt also concerned she is on too much thyroid med -- she would like this checked before her surgery next month No other complaints     Observations/Objective: No vitals obtained Pt is in NAD   Assessment and Plan: 1. Hypothyroidism, unspecified type con't meds Check labs  - Thyroid Panel With TSH; Future  2. 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 Epigastric pain Pain has resolved on its own rto if it returns    Follow Up Instructions:    I discussed the assessment and treatment plan with the patient. The patient was provided an opportunity to ask questions and all were answered. The patient agreed with the plan and demonstrated an understanding of the instructions.   The patient was advised to call back or seek an in-person evaluation if the symptoms worsen or if the condition fails to improve as anticipated.  I provided 20 minutes of non-face-to-face time during this encounter.i    Ann Held, DO

## 2019-01-16 ENCOUNTER — Telehealth: Payer: Self-pay | Admitting: Hematology and Oncology

## 2019-01-16 ENCOUNTER — Other Ambulatory Visit: Payer: Self-pay

## 2019-01-16 ENCOUNTER — Other Ambulatory Visit (INDEPENDENT_AMBULATORY_CARE_PROVIDER_SITE_OTHER): Payer: Medicare Other

## 2019-01-16 ENCOUNTER — Ambulatory Visit (INDEPENDENT_AMBULATORY_CARE_PROVIDER_SITE_OTHER): Payer: Medicare Other | Admitting: Obstetrics & Gynecology

## 2019-01-16 ENCOUNTER — Ambulatory Visit (INDEPENDENT_AMBULATORY_CARE_PROVIDER_SITE_OTHER): Payer: Medicare Other

## 2019-01-16 ENCOUNTER — Encounter: Payer: Self-pay | Admitting: Obstetrics & Gynecology

## 2019-01-16 VITALS — BP 100/70 | HR 64 | Temp 98.0°F | Ht 58.5 in | Wt 109.0 lb

## 2019-01-16 DIAGNOSIS — N950 Postmenopausal bleeding: Secondary | ICD-10-CM

## 2019-01-16 DIAGNOSIS — E039 Hypothyroidism, unspecified: Secondary | ICD-10-CM | POA: Diagnosis not present

## 2019-01-16 DIAGNOSIS — E7212 Methylenetetrahydrofolate reductase deficiency: Secondary | ICD-10-CM | POA: Insufficient documentation

## 2019-01-16 DIAGNOSIS — Z1589 Genetic susceptibility to other disease: Secondary | ICD-10-CM | POA: Insufficient documentation

## 2019-01-16 NOTE — Progress Notes (Signed)
63 y.o. G2P2 Married Declined female here for pelvic ultrasound due to PMP bleeding that she noted after placing a tampon intravaginally.  She was not actually seeing any vaginal bleeding until she placed the tampon and then saw a little pink with removal of the tampon.  She noted tissue was very dry when she removed the tampon.  She had an endometrial biopsy that only showed benign endocervical cells.  Pap was normal.  These were done 12/30/2018.  She is having some vaginal dryness and she wants to have some suggestions about vaginal dryness.    Oncotype testing showing chemotherapy would be of no benefit.    Patient's last menstrual period was 08/25/2010.  Contraception: PMP  Findings:  UTERUS: 5.7 x 3.4 x 2.9cm with 15 x 61mm  EMS: 1.07 mm ADNEXA: Left ovary:  1.3 x 1.1 x 0.7cm       Right ovary: surgically absent CUL DE SAC: no free fluid  Discussion:  Findings reviewed.  Reports spouse has gotten anxious about her waiting to do surgery.  Because of this, surgery has been moved up until August 11.  She will do reconstruction and then start radiation.    Given ultrasound findings, do not feel she needs D&C at this time.  She is going to not place a tampon vaginally any more because of the vaginal dryness she is experiencing.  However, if she has any future vaginal bleeding, she will call and let me know.  Assessment:   PMP bleeding with endometrium of 1.78mm  Plan:   Pt knows to call with any future issues  ~15 minutes spent with patient >50% of time was in face to face discussion of above.      ADDENDUM; UTERUS - ATROPHIC EM W/ FREE FLUID  - 15 X 10 MM, APPEARS ECHO FREE RT OVARY SURGICALLY REMOVED, RT ADNEXA APPEARS NML LT OVARY ATROPHIC

## 2019-01-16 NOTE — Telephone Encounter (Signed)
Scheduled appt per 7/23 sch message - unable to reach pt . Left message with appt date and time   

## 2019-01-17 LAB — THYROID PANEL WITH TSH
Free Thyroxine Index: 2.6 (ref 1.4–3.8)
T3 Uptake: 27 % (ref 22–35)
T4, Total: 9.8 ug/dL (ref 5.1–11.9)
TSH: 2.15 mIU/L (ref 0.40–4.50)

## 2019-01-21 ENCOUNTER — Encounter: Payer: Self-pay | Admitting: *Deleted

## 2019-01-22 NOTE — Addendum Note (Signed)
Addended by: Roma Schanz R on: 01/22/2019 10:50 AM   Modules accepted: Level of Service

## 2019-01-25 HISTORY — PX: REDUCTION MAMMAPLASTY: SUR839

## 2019-01-25 HISTORY — PX: BREAST LUMPECTOMY: SHX2

## 2019-01-27 ENCOUNTER — Other Ambulatory Visit: Payer: Self-pay

## 2019-01-27 ENCOUNTER — Encounter (HOSPITAL_BASED_OUTPATIENT_CLINIC_OR_DEPARTMENT_OTHER): Payer: Self-pay | Admitting: *Deleted

## 2019-01-28 ENCOUNTER — Other Ambulatory Visit (HOSPITAL_COMMUNITY): Payer: Medicare Other

## 2019-01-30 ENCOUNTER — Other Ambulatory Visit: Payer: Medicare Other

## 2019-01-30 LAB — CYTOLOGY - PAP: Diagnosis: NEGATIVE

## 2019-01-31 ENCOUNTER — Other Ambulatory Visit (HOSPITAL_COMMUNITY)
Admission: RE | Admit: 2019-01-31 | Discharge: 2019-01-31 | Disposition: A | Discharge status: Home / Self Care | Payer: Medicare Other | Source: Ambulatory Visit | Attending: General Surgery | Admitting: General Surgery

## 2019-01-31 DIAGNOSIS — Z01812 Encounter for preprocedural laboratory examination: Secondary | ICD-10-CM | POA: Insufficient documentation

## 2019-01-31 DIAGNOSIS — Z20828 Contact with and (suspected) exposure to other viral communicable diseases: Secondary | ICD-10-CM | POA: Insufficient documentation

## 2019-01-31 LAB — SARS CORONAVIRUS 2 (TAT 6-24 HRS): SARS Coronavirus 2: NEGATIVE

## 2019-02-01 NOTE — H&P (Signed)
Kimberly Miles  Location: [facility] Patient #: [id number] DOB: [birth year] Married / Language: English / Race: White Female      History of Present Illness   This is a very pleasant 64 year old female who returns with her husband to discuss surgical management of her right breast cancer. She has seen [name] and Dr. Isidore Moos.  She felt a lump in her right breast below the nipple and imaging studies showed a 2.1 cm mass at the 5:30 position which is mobile. It feels a little bit larger by exam. The left breast 6 is normal. Axilla was negative. 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 Ki-67 50% She has seen [name] and has started on anastrozole Oncotype score is 18 with less than 1% chemotherapy benefit so she will not receive chemotherapy for treatment or to downstage the tumor. She says [name] told her the anastrozole would slowly shrink the tumor  MRI showed a 2.3 cm cancer in the lower right breast at the 6 o'clock position and also noted was some contiguous linear non-mass enhancement extending from the posterior inferior margin of the cancer to the inferior skin surface with questionable skin involvement. Also noted was an irregular mass within the upper outer quadrant left breast. Lymph nodes all looked normal  She's had MRI guided biopsy of the left breast mass and that is a benign fibroadenoma. I gave her a copy of the report. I gave her a copy of the oncotype.  She has seen Dr. Iran Planas She has offered oncoplastic resection and once margins were clear reduction on the opposite side. The patient is strongly motivated for breast conservation. She had lots of questions about how small the breast would be. She had lots of question about whether she should wait and let the anastrozole shrink the tumor versus going ahead at this time. We talked about all surgical options including  mastectomy with or without reconstruction. She is concerned and has a lot of ambivalence about whether to go with surgery now or wait. I told her we could go ahead now but she wanted to wait and see if the anastrozole will shrink the tumor in hopes of having less volume loss.  Past history positive for irritable bowel syndrome. Colitis age 89. Benign thyroid nodule resulting in thyroid lobectomy. Right salpingo-oophorectomy for cyst. Appendectomy. 2 C-sections family history negative for cancer syndromes. She is married with 2 children. Retired used to be a Network engineer. Denies tobacco. I'll call rarely. They live in [facility].   After a 1 hour or greater discussion the plan is as follows: Continue anastrozole ultrasound right breast first week in August to see if there is been any change Follow-up with me second week in August See Dr. Iran Planas in August for preoperative evaluation She wanted to go ahead and tentatively schedule her surgery the first week in September  Plans ammended. See below.  This is scheduled as right breast lumpectomy with RSL, inject blue dye, right axillary deep SLN biopsy. We will ask Dr. Iran Planas to scrub in to help with our plastic incision design We will need to resect some of the inferior pole skin to make sure it is not involved, which I doubt      Allergies  Ampicillin *PENICILLINS*  Sulfa Antibiotics  Allergies Reconciled   Medication History  Rizatriptan Benzoate (10MG Tablet, Oral) Active. Levothyroxine Sodium (75MCG Tablet, Oral) Active. Flexeril (5MG Tablet, Oral) Active. Anastrozole (1MG Tablet,  Oral) Active. Medications Reconciled  Vitals  Weight: 109.4 lb Height: 60in Body Surface Area: 1.44 m Body Mass Index: 21.37 kg/m  Temp.: 98.65F  Pulse: 83 (Regular)  BP: 110/72(Sitting, Left Arm, Standard)     Physical Exam  General Mental Status-Alert. General Appearance-Not in acute distress. Build  & Nutrition-Well nourished. Posture-Normal posture. Gait-Normal.  Head and Neck Head-normocephalic, atraumatic with no lesions or palpable masses. Trachea-midline. Thyroid Gland Characteristics - normal size and consistency and no palpable nodules. Note: Well-healed thyroidectomy scar no mass or adenopathy in the neck   Chest and Lung Exam Chest and lung exam reveals -on auscultation, normal breath sounds, no adventitious sounds and normal vocal resonance.  Breast Note: Breasts are medium size. Biopsy site on the left looks good. 2.5 cm palpable mass below the areolar margin on the right. Quite mobile. Skin looks normal.   Cardiovascular Cardiovascular examination reveals -normal heart sounds, regular rate and rhythm with no murmurs and femoral artery auscultation bilaterally reveals normal pulses, no bruits, no thrills.  Abdomen Inspection Inspection of the abdomen reveals - No Hernias. Palpation/Percussion Palpation and Percussion of the abdomen reveal - Soft, Non Tender, No Rigidity (guarding), No hepatosplenomegaly and No Palpable abdominal masses.  Neurologic Neurologic evaluation reveals -alert and oriented x 3 with no impairment of recent or remote memory, normal attention span and ability to concentrate, normal sensation and normal coordination.  Musculoskeletal Normal Exam - Bilateral-Upper Extremity Strength Normal and Lower Extremity Strength Normal.    Assessment & Plan   PRIMARY CANCER OF LOWER-INNER QUADRANT OF RIGHT FEMALE BREAST (C50.311)  Biopsy of your left breast shows a benign fibroadenoma, which is great news. Nothing further will need to be done on the left  You have been taking anastrozole for about a week now. This may shrink your tumor but that will be very slow  Your ONCOTYPE test shows no benefit to chemotherapy, so you will not be offered chemotherapy. I gave you a copy of this report  You are still strongly motivated  for breast conservation You know that this will require more than one operation to achieve symmetry and a good cosmetic result You also stated that you wanted to see if the anastrozole will shrink the tumor It will likely shrink the tumor but that will be very slow  We discussed lumpectomy sentinel node biopsy radiation therapy mastectomy with or without reconstruction  Current plan is to continue the anastrozole Repeat right breast ultrasound the first week in August to see if there has been any response. We may or may not see much at that time See Dr. Dalbert Batman the week of August 10 See Dr. Iran Planas in August for a preoperative evaluation  We are going to tentatively schedule you for right breast lumpectomy with radioactive seed localization, inject blue dye right breast, right axillary sentinel node biopsy the first week in September I discussed the indications, techniques, and risk of the surgery in detail with you and your husband We can always change that plan if you change your mind Dr. Iran Planas will assist me to make the incisional plan. We will probably remove some skin in the lower pole of the breast because of the MRI findings of enhancement Most likely there is no cancer involvement of the skin.  My office will schedule the ultrasound in early August, the office visit with me the second week of August, the office visit with Dr. Iran Planas in August, and the surgery the first week in September   ADDENDUM: Patient  wanted to move surgery up, which is medically appropriate Surgery date Aug. 10. Dr. Iran Planas advised [place] cancelled. 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 Dalbert Batman, M.D., [facility] General and Minimally invasive Surgery Breast and Colorectal Surgery Office:   [phone] Pager:   [phone]

## 2019-02-03 ENCOUNTER — Other Ambulatory Visit: Payer: Self-pay

## 2019-02-03 ENCOUNTER — Ambulatory Visit
Admission: RE | Admit: 2019-02-03 | Discharge: 2019-02-03 | Disposition: A | Discharge status: Home / Self Care | Payer: Medicare Other | Source: Ambulatory Visit | Attending: General Surgery | Admitting: General Surgery

## 2019-02-03 DIAGNOSIS — C50111 Malignant neoplasm of central portion of right female breast: Secondary | ICD-10-CM

## 2019-02-03 DIAGNOSIS — C50311 Malignant neoplasm of lower-inner quadrant of right female breast: Secondary | ICD-10-CM | POA: Diagnosis not present

## 2019-02-03 NOTE — Progress Notes (Signed)

## 2019-02-04 ENCOUNTER — Ambulatory Visit (HOSPITAL_BASED_OUTPATIENT_CLINIC_OR_DEPARTMENT_OTHER)
Admission: RE | Admit: 2019-02-04 | Discharge: 2019-02-04 | Disposition: A | Discharge status: Home / Self Care | Payer: Medicare Other | Attending: General Surgery | Admitting: General Surgery

## 2019-02-04 ENCOUNTER — Ambulatory Visit (HOSPITAL_BASED_OUTPATIENT_CLINIC_OR_DEPARTMENT_OTHER): Payer: Medicare Other | Admitting: Certified Registered"

## 2019-02-04 ENCOUNTER — Encounter (HOSPITAL_BASED_OUTPATIENT_CLINIC_OR_DEPARTMENT_OTHER): Payer: Self-pay | Admitting: *Deleted

## 2019-02-04 ENCOUNTER — Encounter (HOSPITAL_COMMUNITY)
Admission: RE | Admit: 2019-02-04 | Discharge: 2019-02-04 | Disposition: A | Discharge status: Home / Self Care | Payer: Medicare Other | Source: Ambulatory Visit | Attending: General Surgery | Admitting: General Surgery

## 2019-02-04 ENCOUNTER — Ambulatory Visit
Admission: RE | Admit: 2019-02-04 | Discharge: 2019-02-04 | Disposition: A | Discharge status: Home / Self Care | Payer: Medicare Other | Source: Ambulatory Visit | Attending: General Surgery | Admitting: General Surgery

## 2019-02-04 ENCOUNTER — Encounter (HOSPITAL_BASED_OUTPATIENT_CLINIC_OR_DEPARTMENT_OTHER)
Admission: RE | Disposition: A | Discharge status: Home / Self Care | Payer: Self-pay | Source: Home / Self Care | Attending: General Surgery

## 2019-02-04 DIAGNOSIS — Z79899 Other long term (current) drug therapy: Secondary | ICD-10-CM | POA: Insufficient documentation

## 2019-02-04 DIAGNOSIS — C50911 Malignant neoplasm of unspecified site of right female breast: Secondary | ICD-10-CM | POA: Diagnosis not present

## 2019-02-04 DIAGNOSIS — C50311 Malignant neoplasm of lower-inner quadrant of right female breast: Secondary | ICD-10-CM | POA: Diagnosis not present

## 2019-02-04 DIAGNOSIS — C50111 Malignant neoplasm of central portion of right female breast: Secondary | ICD-10-CM

## 2019-02-04 DIAGNOSIS — Z170 Estrogen receptor positive status [ER+]: Secondary | ICD-10-CM | POA: Diagnosis not present

## 2019-02-04 DIAGNOSIS — E039 Hypothyroidism, unspecified: Secondary | ICD-10-CM | POA: Diagnosis not present

## 2019-02-04 DIAGNOSIS — Z79890 Hormone replacement therapy (postmenopausal): Secondary | ICD-10-CM | POA: Diagnosis not present

## 2019-02-04 DIAGNOSIS — C50811 Malignant neoplasm of overlapping sites of right female breast: Secondary | ICD-10-CM | POA: Diagnosis not present

## 2019-02-04 DIAGNOSIS — G8918 Other acute postprocedural pain: Secondary | ICD-10-CM | POA: Diagnosis not present

## 2019-02-04 HISTORY — DX: Other complications of anesthesia, initial encounter: T88.59XA

## 2019-02-04 HISTORY — DX: Other specified postprocedural states: Z98.890

## 2019-02-04 HISTORY — PX: BREAST LUMPECTOMY WITH RADIOACTIVE SEED AND SENTINEL LYMPH NODE BIOPSY: SHX6550

## 2019-02-04 HISTORY — DX: Other specified postprocedural states: R11.2

## 2019-02-04 SURGERY — BREAST LUMPECTOMY WITH RADIOACTIVE SEED AND SENTINEL LYMPH NODE BIOPSY
Anesthesia: General | Site: Breast | Laterality: Right

## 2019-02-04 MED ORDER — FENTANYL CITRATE (PF) 100 MCG/2ML IJ SOLN
50.0000 ug | INTRAMUSCULAR | Status: DC | PRN
  Administered 2019-02-04: 50 ug via INTRAVENOUS

## 2019-02-04 MED ORDER — ACETAMINOPHEN 500 MG PO TABS
1000.0000 mg | ORAL_TABLET | ORAL | Status: AC
  Administered 2019-02-04: 1000 mg via ORAL

## 2019-02-04 MED ORDER — PROPOFOL 10 MG/ML IV BOLUS
INTRAVENOUS | Status: DC | PRN
  Administered 2019-02-04: 150 mg via INTRAVENOUS

## 2019-02-04 MED ORDER — ONDANSETRON HCL 4 MG/2ML IJ SOLN: 4.0000 mg | Freq: Four times a day (QID) | INTRAMUSCULAR | Status: DC | PRN

## 2019-02-04 MED ORDER — FENTANYL CITRATE (PF) 100 MCG/2ML IJ SOLN
INTRAMUSCULAR | Status: DC | PRN
  Administered 2019-02-04 (×2): 25 ug via INTRAVENOUS

## 2019-02-04 MED ORDER — SODIUM CHLORIDE (PF) 0.9 % IJ SOLN
INTRAVENOUS | Status: DC | PRN
  Administered 2019-02-04: 5 mL via SUBCUTANEOUS

## 2019-02-04 MED ORDER — CELECOXIB 200 MG PO CAPS
200.0000 mg | ORAL_CAPSULE | ORAL | Status: AC
  Administered 2019-02-04: 200 mg via ORAL

## 2019-02-04 MED ORDER — GABAPENTIN 300 MG PO CAPS
300.0000 mg | ORAL_CAPSULE | ORAL | Status: AC
  Administered 2019-02-04: 300 mg via ORAL

## 2019-02-04 MED ORDER — BUPIVACAINE-EPINEPHRINE 0.5% -1:200000 IJ SOLN
INTRAMUSCULAR | Status: DC | PRN
  Administered 2019-02-04: 20 mL

## 2019-02-04 MED ORDER — CELECOXIB 200 MG PO CAPS
ORAL_CAPSULE | ORAL | Status: AC
  Filled 2019-02-04: qty 1

## 2019-02-04 MED ORDER — OXYCODONE HCL 5 MG PO TABS: 5.0000 mg | ORAL_TABLET | Freq: Once | ORAL | Status: DC | PRN

## 2019-02-04 MED ORDER — ROPIVACAINE HCL 5 MG/ML IJ SOLN
INTRAMUSCULAR | Status: DC | PRN
  Administered 2019-02-04: 20 mL via PERINEURAL

## 2019-02-04 MED ORDER — SCOPOLAMINE 1 MG/3DAYS TD PT72
1.0000 | MEDICATED_PATCH | Freq: Once | TRANSDERMAL | Status: DC
  Administered 2019-02-04: 1.5 mg via TRANSDERMAL

## 2019-02-04 MED ORDER — PROPOFOL 10 MG/ML IV BOLUS
INTRAVENOUS | Status: AC
  Filled 2019-02-04: qty 20

## 2019-02-04 MED ORDER — HYDROCODONE-ACETAMINOPHEN 5-325 MG PO TABS: 1.0000 | ORAL_TABLET | Freq: Four times a day (QID) | ORAL | 0 refills | Status: DC | PRN

## 2019-02-04 MED ORDER — PROMETHAZINE HCL 25 MG/ML IJ SOLN
INTRAMUSCULAR | Status: AC
  Filled 2019-02-04: qty 1

## 2019-02-04 MED ORDER — MIDAZOLAM HCL 2 MG/2ML IJ SOLN
1.0000 mg | INTRAMUSCULAR | Status: DC | PRN
  Administered 2019-02-04: 1 mg via INTRAVENOUS

## 2019-02-04 MED ORDER — ONDANSETRON HCL 4 MG/2ML IJ SOLN
INTRAMUSCULAR | Status: DC | PRN
  Administered 2019-02-04: 4 mg via INTRAVENOUS

## 2019-02-04 MED ORDER — GABAPENTIN 300 MG PO CAPS
ORAL_CAPSULE | ORAL | Status: AC
  Filled 2019-02-04: qty 1

## 2019-02-04 MED ORDER — SODIUM CHLORIDE 0.9% FLUSH: 3.0000 mL | Freq: Two times a day (BID) | INTRAVENOUS | Status: DC

## 2019-02-04 MED ORDER — CEFAZOLIN SODIUM-DEXTROSE 2-4 GM/100ML-% IV SOLN
INTRAVENOUS | Status: AC
  Filled 2019-02-04: qty 100

## 2019-02-04 MED ORDER — CEFAZOLIN SODIUM-DEXTROSE 2-4 GM/100ML-% IV SOLN
2.0000 g | INTRAVENOUS | Status: AC
  Administered 2019-02-04: 10:00:00 2 g via INTRAVENOUS

## 2019-02-04 MED ORDER — OXYCODONE HCL 5 MG/5ML PO SOLN: 5.0000 mg | Freq: Once | ORAL | Status: DC | PRN

## 2019-02-04 MED ORDER — CHLORHEXIDINE GLUCONATE CLOTH 2 % EX PADS: 6.0000 | MEDICATED_PAD | Freq: Once | CUTANEOUS | Status: DC

## 2019-02-04 MED ORDER — FENTANYL CITRATE (PF) 100 MCG/2ML IJ SOLN
INTRAMUSCULAR | Status: AC
  Filled 2019-02-04: qty 2

## 2019-02-04 MED ORDER — PROMETHAZINE HCL 25 MG/ML IJ SOLN
6.2500 mg | Freq: Once | INTRAMUSCULAR | Status: AC
  Administered 2019-02-04: 6.25 mg via INTRAVENOUS

## 2019-02-04 MED ORDER — EPHEDRINE SULFATE 50 MG/ML IJ SOLN
INTRAMUSCULAR | Status: DC | PRN
  Administered 2019-02-04 (×2): 5 mg via INTRAVENOUS

## 2019-02-04 MED ORDER — DEXAMETHASONE SODIUM PHOSPHATE 4 MG/ML IJ SOLN
INTRAMUSCULAR | Status: DC | PRN
  Administered 2019-02-04: 5 mg via INTRAVENOUS

## 2019-02-04 MED ORDER — MIDAZOLAM HCL 2 MG/2ML IJ SOLN
INTRAMUSCULAR | Status: AC
  Filled 2019-02-04: qty 2

## 2019-02-04 MED ORDER — SCOPOLAMINE 1 MG/3DAYS TD PT72
MEDICATED_PATCH | TRANSDERMAL | Status: AC
  Filled 2019-02-04: qty 1

## 2019-02-04 MED ORDER — LACTATED RINGERS IV SOLN
INTRAVENOUS | Status: DC
  Administered 2019-02-04: 08:00:00 via INTRAVENOUS

## 2019-02-04 MED ORDER — TECHNETIUM TC 99M SULFUR COLLOID FILTERED
1.0000 | Freq: Once | INTRAVENOUS | Status: AC | PRN
  Administered 2019-02-04: 1 via INTRADERMAL

## 2019-02-04 MED ORDER — FENTANYL CITRATE (PF) 100 MCG/2ML IJ SOLN
25.0000 ug | INTRAMUSCULAR | Status: DC | PRN
  Administered 2019-02-04: 50 ug via INTRAVENOUS

## 2019-02-04 MED ORDER — ACETAMINOPHEN 500 MG PO TABS
ORAL_TABLET | ORAL | Status: AC
  Filled 2019-02-04: qty 2

## 2019-02-04 SURGICAL SUPPLY — 61 items
ADH SKN CLS APL DERMABOND .7 (GAUZE/BANDAGES/DRESSINGS) ×1
APL PRP STRL LF DISP 70% ISPRP (MISCELLANEOUS) ×1
APPLIER CLIP 11 MED OPEN (CLIP) ×2
APR CLP MED 11 20 MLT OPN (CLIP) ×1
BINDER BREAST LRG (GAUZE/BANDAGES/DRESSINGS) IMPLANT
BINDER BREAST MEDIUM (GAUZE/BANDAGES/DRESSINGS) ×1 IMPLANT
BINDER BREAST XLRG (GAUZE/BANDAGES/DRESSINGS) IMPLANT
BINDER BREAST XXLRG (GAUZE/BANDAGES/DRESSINGS) IMPLANT
BLADE HEX COATED 2.75 (ELECTRODE) ×3 IMPLANT
BLADE SURG 15 STRL LF DISP TIS (BLADE) ×2 IMPLANT
BLADE SURG 15 STRL SS (BLADE) ×4
BNDG ELASTIC 6X5.8 VLCR STR LF (GAUZE/BANDAGES/DRESSINGS) IMPLANT
CANISTER SUCT 1200ML W/VALVE (MISCELLANEOUS) ×2 IMPLANT
CHLORAPREP W/TINT 26 (MISCELLANEOUS) ×2 IMPLANT
CLIP APPLIE 11 MED OPEN (CLIP) ×1 IMPLANT
COVER BACK TABLE REUSABLE LG (DRAPES) ×2 IMPLANT
COVER MAYO STAND REUSABLE (DRAPES) ×2 IMPLANT
COVER PROBE W GEL 5X96 (DRAPES) ×2 IMPLANT
COVER WAND RF STERILE (DRAPES) IMPLANT
DECANTER SPIKE VIAL GLASS SM (MISCELLANEOUS) IMPLANT
DERMABOND ADVANCED (GAUZE/BANDAGES/DRESSINGS) ×1
DERMABOND ADVANCED .7 DNX12 (GAUZE/BANDAGES/DRESSINGS) IMPLANT
DRAPE HALF SHEET 70X43 (DRAPES) ×2 IMPLANT
DRAPE LAPAROSCOPIC ABDOMINAL (DRAPES) ×2 IMPLANT
DRAPE UTILITY XL STRL (DRAPES) ×2 IMPLANT
DRSG PAD ABDOMINAL 8X10 ST (GAUZE/BANDAGES/DRESSINGS) ×4 IMPLANT
ELECT REM PT RETURN 9FT ADLT (ELECTROSURGICAL) ×2
ELECTRODE REM PT RTRN 9FT ADLT (ELECTROSURGICAL) ×1 IMPLANT
GAUZE SPONGE 4X4 12PLY STRL (GAUZE/BANDAGES/DRESSINGS) ×2 IMPLANT
GAUZE SPONGE 4X4 12PLY STRL LF (GAUZE/BANDAGES/DRESSINGS) IMPLANT
GLOVE EUDERMIC 7 POWDERFREE (GLOVE) ×3 IMPLANT
GOWN STRL REUS W/ TWL LRG LVL3 (GOWN DISPOSABLE) ×1 IMPLANT
GOWN STRL REUS W/ TWL XL LVL3 (GOWN DISPOSABLE) ×1 IMPLANT
GOWN STRL REUS W/TWL LRG LVL3 (GOWN DISPOSABLE) ×2
GOWN STRL REUS W/TWL XL LVL3 (GOWN DISPOSABLE) ×2
ILLUMINATOR WAVEGUIDE N/F (MISCELLANEOUS) IMPLANT
KIT MARKER MARGIN INK (KITS) ×2 IMPLANT
LIGHT WAVEGUIDE WIDE FLAT (MISCELLANEOUS) IMPLANT
NDL HYPO 25X1 1.5 SAFETY (NEEDLE) ×2 IMPLANT
NDL SAFETY ECLIPSE 18X1.5 (NEEDLE) ×1 IMPLANT
NEEDLE HYPO 18GX1.5 SHARP (NEEDLE) ×2
NEEDLE HYPO 25X1 1.5 SAFETY (NEEDLE) ×4 IMPLANT
NS IRRIG 1000ML POUR BTL (IV SOLUTION) ×2 IMPLANT
PACK BASIN DAY SURGERY FS (CUSTOM PROCEDURE TRAY) ×2 IMPLANT
PAD ALCOHOL SWAB (MISCELLANEOUS) ×2 IMPLANT
PENCIL BUTTON HOLSTER BLD 10FT (ELECTRODE) ×3 IMPLANT
SLEEVE SCD COMPRESS KNEE MED (MISCELLANEOUS) ×2 IMPLANT
SPONGE LAP 18X18 RF (DISPOSABLE) IMPLANT
SPONGE LAP 4X18 RFD (DISPOSABLE) ×2 IMPLANT
SUT MNCRL AB 4-0 PS2 18 (SUTURE) ×4 IMPLANT
SUT SILK 2 0 SH (SUTURE) ×2 IMPLANT
SUT VIC AB 2-0 CT1 27 (SUTURE)
SUT VIC AB 2-0 CT1 TAPERPNT 27 (SUTURE) IMPLANT
SUT VIC AB 3-0 SH 27 (SUTURE)
SUT VIC AB 3-0 SH 27X BRD (SUTURE) IMPLANT
SUT VICRYL 3-0 CR8 SH (SUTURE) ×3 IMPLANT
SYR 10ML LL (SYRINGE) ×4 IMPLANT
TOWEL GREEN STERILE FF (TOWEL DISPOSABLE) ×3 IMPLANT
TRAY FAXITRON CT DISP (TRAY / TRAY PROCEDURE) ×2 IMPLANT
TUBE CONNECTING 20X1/4 (TUBING) ×2 IMPLANT
YANKAUER SUCT BULB TIP NO VENT (SUCTIONS) ×2 IMPLANT

## 2019-02-04 NOTE — Anesthesia Preprocedure Evaluation (Signed)
Anesthesia Evaluation  Patient identified by MRN, date of birth, ID band Patient awake    Reviewed: Allergy & Precautions, H&P , NPO status , Patient's Chart, lab work & pertinent test results  History of Anesthesia Complications (+) PONV and history of anesthetic complications  Airway Mallampati: II   Neck ROM: full    Dental   Pulmonary neg pulmonary ROS,    breath sounds clear to auscultation       Cardiovascular negative cardio ROS   Rhythm:regular Rate:Normal     Neuro/Psych  Headaches, PSYCHIATRIC DISORDERS Anxiety    GI/Hepatic   Endo/Other  Hypothyroidism   Renal/GU      Musculoskeletal  (+) Arthritis ,   Abdominal   Peds  Hematology   Anesthesia Other Findings   Reproductive/Obstetrics                             Anesthesia Physical Anesthesia Plan  ASA: II  Anesthesia Plan: General   Post-op Pain Management:  Regional for Post-op pain   Induction: Intravenous  PONV Risk Score and Plan: 4 or greater and Ondansetron, Dexamethasone, Midazolam, Scopolamine patch - Pre-op and Treatment may vary due to age or medical condition  Airway Management Planned: LMA  Additional Equipment:   Intra-op Plan:   Post-operative Plan:   Informed Consent: I have reviewed the patients History and Physical, chart, labs and discussed the procedure including the risks, benefits and alternatives for the proposed anesthesia with the patient or authorized representative who has indicated his/her understanding and acceptance.       Plan Discussed with: CRNA, Anesthesiologist and Surgeon  Anesthesia Plan Comments:         Anesthesia Quick Evaluation

## 2019-02-04 NOTE — Anesthesia Procedure Notes (Signed)
Anesthesia Regional Block: Pectoralis block   Pre-Anesthetic Checklist: ,, timeout performed, Correct Patient, Correct Site, Correct Laterality, Correct Procedure, Correct Position, site marked, Risks and benefits discussed,  Surgical consent,  Pre-op evaluation,  At surgeon's request and post-op pain management  Laterality: Right  Prep: chloraprep       Needles:  Injection technique: Single-shot  Needle Type: Echogenic Needle     Needle Length: 9cm  Needle Gauge: 21     Additional Needles:   Narrative:  Start time: 02/04/2019 9:01 AM End time: 02/04/2019 9:07 AM Injection made incrementally with aspirations every 5 mL.  Performed by: Personally  Anesthesiologist: Albertha Ghee, MD  Additional Notes: Pt tolerated the procedure well.

## 2019-02-04 NOTE — Anesthesia Postprocedure Evaluation (Signed)
Anesthesia Post Note  Patient: Kimberly Miles  Procedure(s) Performed: RIGHT BREAST LUMPECTOMY WITH RADIOACTIVE SEED AND RIGHT AXILLARY DEEP SENTINEL LYMPH NODE BIOPSY WITH BLUE DYE INJECTION (Right Breast)     Patient location during evaluation: PACU Anesthesia Type: General Level of consciousness: awake and alert Pain management: pain level controlled Vital Signs Assessment: post-procedure vital signs reviewed and stable Respiratory status: spontaneous breathing, nonlabored ventilation, respiratory function stable and patient connected to nasal cannula oxygen Cardiovascular status: blood pressure returned to baseline and stable Postop Assessment: no apparent nausea or vomiting Anesthetic complications: no    Last Vitals:  Vitals:   02/04/19 1248 02/04/19 1325  BP: 114/68 112/76  Pulse: 66 79  Resp: 16 18  Temp: 36.6 C 36.6 C  SpO2: 100% 100%    Last Pain:  Vitals:   02/04/19 1325  TempSrc: Oral  PainSc:                  Catalina Gravel

## 2019-02-04 NOTE — Interval H&P Note (Signed)
History and Physical Interval Note:  02/04/2019 9:02 AM  Kimberly Miles  has presented today for surgery, with the diagnosis of right breast cancer.  The various methods of treatment have been discussed with the patient and family. After consideration of risks, benefits and other options for treatment, the patient has consented to  Procedure(s): RIGHT BREAST LUMPECTOMY WITH RADIOACTIVE SEED AND RIGHT AXILLARY DEEP SENTINEL LYMPH NODE BIOPSY WITH BLUE DYE INJECTION (Right) as a surgical intervention.  The patient's history has been reviewed, patient examined, no change in status, stable for surgery.  I have reviewed the patient's chart and labs.  Questions were answered to the patient's satisfaction.     Adin Hector

## 2019-02-04 NOTE — Op Note (Signed)
Patient Name:           Kimberly Miles   Date of Surgery:        02/04/2019  Pre op Diagnosis:      Invasive ductal carcinoma right breast, lower pole, receptor positive, HER-2 negative  Post op Diagnosis:    Same  Procedure:                 Inject blue dye right breast, right breast lumpectomy including overlying skin, right axillary deep sentinel lymph node biopsy  Surgeon:                     Edsel Petrin. Dalbert Batman, M.D., FACS  Assistant:                      OR staff  Operative Indications:   This is a very pleasant 64 year old female who is brought to the operating room for definitive surgical management of her right breast cancer. She has seen [name] and Dr. Isidore Moos and Dr. Iran Planas.     She felt a lump in her right breast below the nipple and imaging studies showed a 2.1 cm mass at the 5:30 position which is mobile. It feels a little bit larger by exam. The left breast  is normal. Axillary ultrasound was negative. 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 Ki-67 50% She has seen [name] and has started on anastrozole Oncotype score is 18 with less than 1% chemotherapy benefit so she will not receive chemotherapy for treatment or to downstage the tumor.      MRI showed a 2.3 cm cancer in the lower right breast at the 6 o'clock position and also noted was some contiguous linear non-mass enhancement extending from the posterior inferior margin of the cancer to the inferior skin surface with questionable skin involvement. Also noted was an irregular mass within the upper outer quadrant left breast. Lymph nodes all looked normal      She's had MRI guided biopsy of the left breast mass and that is a benign fibroadenoma.      She has seen Dr. Iran Planas She has offered oncoplastic resection and once margins were clear reduction on the opposite side. The patient is strongly motivated for breast conservation.We  talked about all surgical options including mastectomy with or without reconstruction.     She has decided to go ahead with breast conservation.  This will be a right lumpectomy including skin and right axillary deep sentinel lymph node biopsy.  A batwing incision with protection and preservation of the nipple was marked preop to guide the lumpectomy step.  This is scheduled as right breast lumpectomy with RSL, inject blue dye, right axillary deep SLN biopsy.   Operative Findings:       Dr. Iran Planas marked a batwing incision on there right with preservation of the nipple and areola.  This will ultimately be used for her definitive reduction once we know that her margins are clear.  We discussed the plan and I will resect the cancer with a margin and as well will resect some of the inferior skin to be studied.  The lumpectomy was performed with a generous transverse elliptical incision.  The specimen mammogram looked good containing the seed and marker clip in the center of the specimen.  I took a generous lumpectomy specimen all the way up to the lower areolar margin and all the way  to the chest wall.  The generous posterior margin is the chest wall.  The technetium radionuclide did not map very well.  However the blue dye did and I was able to trace out sentinel nodes with blue dye.  I found 3 or 4 sentinel lymph nodes.  Procedure in Detail:          Following the induction of general LMA anesthesia a surgical timeout was performed.  Following alcohol prep I injected 5 cc of dilute methylene blue into the right breast subareolar area and massaged the breast for a few minutes.  The entire right chest wall and right upper arm were prepped and draped in a sterile fashion.  Intravenous antibiotics were given.  0.5% Marcaine with epinephrine was used as a local infiltration anesthetic.     The lumpectomy was performed first.  I used the neoprobe to map out the tumor and  correlated that with the images that  were projected on the screen.  I chose to make a generous transverse skin ellipse within the batwing incision.  This incision was made with a knife and the lumpectomy was performed using electrocautery and the neoprobe.  The specimen was removed and marked with silk sutures and a 6 color ink kit to orient the pathologist.  Specimen mammogram looked good as described above.  The specimen was sent to the lab where the seed was retrieved.  Wound was irrigated.  Hemostasis was excellent.  5 metal marker clips were placed in the walls of the lumpectomy cavity.  The lumpectomy was closed in multiple layers with interrupted 3-0 Vicryl and the skin closed with a running subcuticular 4-0 Monocryl and Dermabond.      I made a transverse incision in the right axilla at the hairline.  Dissection was carried down through this clavipectoral fascia.  Using the neoprobe I found that there was mostly just background activity and no specific hot lymph nodes.  Fortunately, the blue dye was noticed in the lymphatics and I traced out 3 sentinel lymph nodes.  These were sent to the lab.  Hemostasis was excellent and achieved electrocautery.  The axillary wound was irrigated.  Clavipectoral fascia was closed with 3-0 Vicryl.  The skin was closed with a running subcuticular 4-0 Monocryl and Dermabond.  Clean bandages and a breast binder were placed.  The patient tolerated the procedure well and was taken to PACU in stable condition.  EBL 20 cc.  Counts correct.  Complications none.    Addendum: I logged onto the PMP aware website and reviewed her prescription medication history    Alvie Speltz M. Dalbert Batman, M.D., FACS General and Minimally Invasive Surgery Breast and Colorectal Surgery  02/04/2019 11:27 AM

## 2019-02-04 NOTE — Discharge Instructions (Signed)
No tylenol until after 1:45 today.  No ibuprofen until after 3:45 today.   Post Anesthesia Home Care Instructions  Activity: Get plenty of rest for the remainder of the day. A responsible individual must stay with you for 24 hours following the procedure.  For the next 24 hours, DO NOT: -Drive a car -Paediatric nurse -Drink alcoholic beverages -Take any medication unless instructed by your physician -Make any legal decisions or sign important papers.  Meals: Start with liquid foods such as gelatin or soup. Progress to regular foods as tolerated. Avoid greasy, spicy, heavy foods. If nausea and/or vomiting occur, drink only clear liquids until the nausea and/or vomiting subsides. Call your physician if vomiting continues.  Special Instructions/Symptoms: Your throat may feel dry or sore from the anesthesia or the breathing tube placed in your throat during surgery. If this causes discomfort, gargle with warm salt water. The discomfort should disappear within 24 hours.  If you had a scopolamine patch placed behind your ear for the management of post- operative nausea and/or vomiting:  1. The medication in the patch is effective for 72 hours, after which it should be removed.  Wrap patch in a tissue and discard in the trash. Wash hands thoroughly with soap and water. 2. You may remove the patch earlier than 72 hours if you experience unpleasant side effects which may include dry mouth, dizziness or visual disturbances. 3. Avoid touching the patch. Wash your hands with soap and water after contact with the patch.    *No Ibuprofen or Tylenol until after 2pm  [employer] Phone Number [phone]  BREAST BIOPSY/ PARTIAL MASTECTOMY: POST OP INSTRUCTIONS  Always review your discharge instruction sheet given to you by the facility where your surgery was performed.  IF YOU HAVE DISABILITY OR FAMILY LEAVE FORMS, YOU MUST BRING THEM TO THE OFFICE FOR PROCESSING.  DO NOT  GIVE THEM TO YOUR DOCTOR.  1. A prescription for pain medication may be given to you upon discharge.  Take your pain medication as prescribed, if needed.  If narcotic pain medicine is not needed, then you may take acetaminophen (Tylenol) or ibuprofen (Advil) as needed. 2. Take your usually prescribed medications unless otherwise directed 3. If you need a refill on your pain medication, please contact your pharmacy.  They will contact our office to request authorization.  Prescriptions will not be filled after 5pm or on week-ends. 4. You should eat very light the first 24 hours after surgery, such as soup, crackers, pudding, etc.  Resume your normal diet the day after surgery. 5. Most patients will experience some swelling and bruising in the breast.  Ice packs and a good support bra will help.  Swelling and bruising can take several days to resolve.  6. It is common to experience some constipation if taking pain medication after surgery.  Increasing fluid intake and taking a stool softener will usually help or prevent this problem from occurring.  A mild laxative (Milk of Magnesia or Miralax) should be taken according to package directions if there are no bowel movements after 48 hours. 7. Unless discharge instructions indicate otherwise, you may remove your bandages 24-48 hours after surgery, and you may shower at that time.  You may have steri-strips (small skin tapes) in place directly over the incision.  These strips should be left on the skin for 7-10 days.  If your surgeon used skin glue on the incision, you may shower in 24 hours.  The glue will flake off  over the next 2-3 weeks.  Any sutures or staples will be removed at the office during your follow-up visit. 8. ACTIVITIES:  You may resume regular daily activities (gradually increasing) beginning the next day.  Wearing a good support bra or sports bra minimizes pain and swelling.  You may have sexual intercourse when it is comfortable. a. You may  drive when you no longer are taking prescription pain medication, you can comfortably wear a seatbelt, and you can safely maneuver your car and apply brakes. b. RETURN TO WORK:  ______________________________________________________________________________________ 9. You should see your doctor in the office for a follow-up appointment approximately two weeks after your surgery.  Your doctors nurse will typically make your follow-up appointment when she calls you with your pathology report.  Expect your pathology report 2-3 business days after your surgery.  You may call to check if you do not hear from [place] after three days. 10. OTHER INSTRUCTIONS: _______________________________________________________________________________________________ _____________________________________________________________________________________________________________________________________ _____________________________________________________________________________________________________________________________________ _____________________________________________________________________________________________________________________________________  WHEN TO CALL YOUR DOCTOR: 1. Fever over 101.0 2. Nausea and/or vomiting. 3. Extreme swelling or bruising. 4. Continued bleeding from incision. 5. Increased pain, redness, or drainage from the incision.  The clinic staff is available to answer your questions during regular business hours.  Please dont hesitate to call and ask to speak to one of the nurses for clinical concerns.  If you have a medical emergency, go to the nearest emergency room or call 911.  A surgeon from [facility] is always on call at the hospital.  For further questions, please visit [email or web address]                Managing Your Pain After Surgery Without Opioids    Thank you for participating in our program to help patients manage their pain  after surgery without opioids. This is part of our effort to provide you with the best care possible, without exposing you or your family to the risk that opioids pose.  What pain can I expect after surgery? You can expect to have some pain after surgery. This is normal. The pain is typically worse the day after surgery, and quickly begins to get better. Many studies have found that many patients are able to manage their pain after surgery with Over-the-Counter (OTC) medications such as Tylenol and Motrin. If you have a condition that does not allow you to take Tylenol or Motrin, notify your surgical team.  How will I manage my pain? The best strategy for controlling your pain after surgery is around the clock pain control with Tylenol (acetaminophen) and Motrin (ibuprofen or Advil). Alternating these medications with each other allows you to maximize your pain control. In addition to Tylenol and Motrin, you can use heating pads or ice packs on your incisions to help reduce your pain.  How will I alternate your regular strength over-the-counter pain medication? You will take a dose of pain medication every three hours. ; Start by taking 650 mg of Tylenol (2 pills of 325 mg) ; 3 hours later take 600 mg of Motrin (3 pills of 200 mg) ; 3 hours after taking the Motrin take 650 mg of Tylenol ; 3 hours after that take 600 mg of Motrin.   - 1 -  See example - if your first dose of Tylenol is at 12:00 PM   12:00 PM Tylenol 650 mg (2 pills of 325 mg)  3:00 PM Motrin 600 mg (3 pills of 200 mg)  6:00 PM Tylenol 650 mg (2 pills  of 325 mg)  9:00 PM Motrin 600 mg (3 pills of 200 mg)  Continue alternating every 3 hours   We recommend that you follow this schedule around-the-clock for at least 3 days after surgery, or until you feel that it is no longer needed. Use the table on the last page of this handout to keep track of the medications you are taking. Important: Do not take more than 3000mg  of  Tylenol or 3200mg  of Motrin in a 24-hour period. Do not take ibuprofen/Motrin if you have a history of bleeding stomach ulcers, severe kidney disease, &/or actively taking a blood thinner  What if I still have pain? If you have pain that is not controlled with the over-the-counter pain medications (Tylenol and Motrin or Advil) you might have what we call breakthrough pain. You will receive a prescription for a small amount of an opioid pain medication such as Oxycodone, Tramadol, or Tylenol with Codeine. Use these opioid pills in the first 24 hours after surgery if you have breakthrough pain. Do not take more than 1 pill every 4-6 hours.  If you still have uncontrolled pain after using all opioid pills, don't hesitate to call our staff using the number provided. We will help make sure you are managing your pain in the best way possible, and if necessary, we can provide a prescription for additional pain medication.   Day 1    Time  Name of Medication Number of pills taken  Amount of Acetaminophen  Pain Level   Comments  AM PM       AM PM       AM PM       AM PM       AM PM       AM PM       AM PM       AM PM       Total Daily amount of Acetaminophen Do not take more than  3,000 mg per day      Day 2    Time  Name of Medication Number of pills taken  Amount of Acetaminophen  Pain Level   Comments  AM PM       AM PM       AM PM       AM PM       AM PM       AM PM       AM PM       AM PM       Total Daily amount of Acetaminophen Do not take more than  3,000 mg per day      Day 3    Time  Name of Medication Number of pills taken  Amount of Acetaminophen  Pain Level   Comments  AM PM       AM PM       AM PM       AM PM          AM PM       AM PM       AM PM       AM PM       Total Daily amount of Acetaminophen Do not take more than  3,000 mg per day      Day 4    Time  Name of Medication Number of pills taken  Amount of Acetaminophen  Pain  Level   Comments  AM PM  AM PM       AM PM       AM PM       AM PM       AM PM       AM PM       AM PM       Total Daily amount of Acetaminophen Do not take more than  3,000 mg per day      Day 5    Time  Name of Medication Number of pills taken  Amount of Acetaminophen  Pain Level   Comments  AM PM       AM PM       AM PM       AM PM       AM PM       AM PM       AM PM       AM PM       Total Daily amount of Acetaminophen Do not take more than  3,000 mg per day       Day 6    Time  Name of Medication Number of pills taken  Amount of Acetaminophen  Pain Level  Comments  AM PM       AM PM       AM PM       AM PM       AM PM       AM PM       AM PM       AM PM       Total Daily amount of Acetaminophen Do not take more than  3,000 mg per day      Day 7    Time  Name of Medication Number of pills taken  Amount of Acetaminophen  Pain Level   Comments  AM PM       AM PM       AM PM       AM PM       AM PM       AM PM       AM PM       AM PM       Total Daily amount of Acetaminophen Do not take more than  3,000 mg per day        For additional information about how and where to safely dispose of unused opioid medications - [employer]  Disclaimer: This document contains information and/or instructional materials adapted from [place] for the typical patient with your condition. It does not replace medical advice from your health care provider because your experience may differ from that of the typical patient. Talk to your health care provider if you have any questions about this document, your condition or your treatment plan. Adapted from [place]

## 2019-02-04 NOTE — Transfer of Care (Signed)
Immediate Anesthesia Transfer of Care Note  Patient: Kimberly Miles  Procedure(s) Performed: RIGHT BREAST LUMPECTOMY WITH RADIOACTIVE SEED AND RIGHT AXILLARY DEEP SENTINEL LYMPH NODE BIOPSY WITH BLUE DYE INJECTION (Right Breast)  Patient Location: PACU  Anesthesia Type:GA combined with regional for post-op pain  Level of Consciousness: awake  Airway & Oxygen Therapy: Patient Spontanous Breathing and Patient connected to face mask oxygen  Post-op Assessment: Report given to RN  Post vital signs: Reviewed and stable  Last Vitals:  Vitals Value Taken Time  BP    Temp    Pulse 80 02/04/19 1128  Resp    SpO2 100 % 02/04/19 1128  Vitals shown include unvalidated device data.  Last Pain:  Vitals:   02/04/19 0800  TempSrc: Oral  PainSc: 0-No pain         Complications: No apparent anesthesia complications

## 2019-02-04 NOTE — Progress Notes (Signed)
  Assisted Dr. Hodierne with right, ultrasound guided, pectoralis block. Side rails up, monitors on throughout procedure. See vital signs in flow sheet. Tolerated Procedure well. 

## 2019-02-04 NOTE — Anesthesia Procedure Notes (Signed)
Procedure Name: LMA Insertion Date/Time: 02/04/2019 10:05 AM Performed by: Lavonia Dana, CRNA Pre-anesthesia Checklist: Patient identified, Emergency Drugs available, Suction available and Patient being monitored Patient Re-evaluated:Patient Re-evaluated prior to induction Oxygen Delivery Method: Circle system utilized Preoxygenation: Pre-oxygenation with 100% oxygen Induction Type: IV induction Ventilation: Mask ventilation without difficulty LMA: LMA inserted LMA Size: 4.0 Number of attempts: 1 Airway Equipment and Method: Bite block Placement Confirmation: positive ETCO2 Tube secured with: Tape Dental Injury: Teeth and Oropharynx as per pre-operative assessment

## 2019-02-05 ENCOUNTER — Other Ambulatory Visit: Payer: Self-pay

## 2019-02-05 ENCOUNTER — Encounter (HOSPITAL_BASED_OUTPATIENT_CLINIC_OR_DEPARTMENT_OTHER): Payer: Self-pay | Admitting: General Surgery

## 2019-02-05 DIAGNOSIS — Z170 Estrogen receptor positive status [ER+]: Secondary | ICD-10-CM | POA: Diagnosis not present

## 2019-02-05 DIAGNOSIS — Z79811 Long term (current) use of aromatase inhibitors: Secondary | ICD-10-CM | POA: Diagnosis not present

## 2019-02-05 DIAGNOSIS — Z483 Aftercare following surgery for neoplasm: Secondary | ICD-10-CM | POA: Diagnosis not present

## 2019-02-05 DIAGNOSIS — C50311 Malignant neoplasm of lower-inner quadrant of right female breast: Secondary | ICD-10-CM | POA: Diagnosis not present

## 2019-02-06 DIAGNOSIS — Z79811 Long term (current) use of aromatase inhibitors: Secondary | ICD-10-CM | POA: Diagnosis not present

## 2019-02-06 DIAGNOSIS — C50311 Malignant neoplasm of lower-inner quadrant of right female breast: Secondary | ICD-10-CM | POA: Diagnosis not present

## 2019-02-06 DIAGNOSIS — Z483 Aftercare following surgery for neoplasm: Secondary | ICD-10-CM | POA: Diagnosis not present

## 2019-02-06 DIAGNOSIS — Z170 Estrogen receptor positive status [ER+]: Secondary | ICD-10-CM | POA: Diagnosis not present

## 2019-02-06 NOTE — Assessment & Plan Note (Signed)
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 I discussed the margins as well as lymph node surgeries. We also discussed the final staging along with previously performed ER/PR and HER-2/neu testing.   Treatment plan: Adjuvant radiation therapy followed by adjuvant antiestrogen therapy I will see her at the end of radiation.

## 2019-02-07 ENCOUNTER — Other Ambulatory Visit (HOSPITAL_COMMUNITY)
Admit: 2019-02-07 | Discharge: 2019-02-07 | Disposition: A | Discharge status: Home / Self Care | Payer: Medicare Other | Source: Ambulatory Visit | Attending: Plastic Surgery | Admitting: Plastic Surgery

## 2019-02-07 DIAGNOSIS — Z01812 Encounter for preprocedural laboratory examination: Secondary | ICD-10-CM | POA: Diagnosis not present

## 2019-02-07 DIAGNOSIS — Z170 Estrogen receptor positive status [ER+]: Secondary | ICD-10-CM | POA: Diagnosis not present

## 2019-02-07 DIAGNOSIS — Z79811 Long term (current) use of aromatase inhibitors: Secondary | ICD-10-CM | POA: Diagnosis not present

## 2019-02-07 DIAGNOSIS — Z20828 Contact with and (suspected) exposure to other viral communicable diseases: Secondary | ICD-10-CM | POA: Diagnosis not present

## 2019-02-07 DIAGNOSIS — C50311 Malignant neoplasm of lower-inner quadrant of right female breast: Secondary | ICD-10-CM | POA: Diagnosis not present

## 2019-02-07 DIAGNOSIS — Z483 Aftercare following surgery for neoplasm: Secondary | ICD-10-CM | POA: Diagnosis not present

## 2019-02-07 LAB — SARS CORONAVIRUS 2 (TAT 6-24 HRS): SARS Coronavirus 2: NEGATIVE

## 2019-02-07 NOTE — Progress Notes (Signed)
Inform patient of Pathology report,. Tell her that her breast cancer was 2.5 cm diameter All 3 lymph nodes are negative for cancer The margins are negative and she will not need any further cancer surgery You can begin to make plans for plastic surgery I will discuss this with her in detail at her next office visit I me know that she were able to contact her  hmi

## 2019-02-10 NOTE — Anesthesia Preprocedure Evaluation (Addendum)
Anesthesia Evaluation  Patient identified by MRN, date of birth, ID band Patient awake    Reviewed: Allergy & Precautions, NPO status , Patient's Chart, lab work & pertinent test results  History of Anesthesia Complications (+) PONV and history of anesthetic complications  Airway Mallampati: II  TM Distance: >3 FB Neck ROM: Full  Mouth opening: Limited Mouth Opening  Dental no notable dental hx.    Pulmonary neg pulmonary ROS,    Pulmonary exam normal        Cardiovascular negative cardio ROS Normal cardiovascular exam     Neuro/Psych  Headaches, Anxiety    GI/Hepatic negative GI ROS, Neg liver ROS,   Endo/Other  Hypothyroidism   Renal/GU negative Renal ROS     Musculoskeletal  (+) Arthritis ,   Abdominal   Peds  Hematology negative hematology ROS (+)   Anesthesia Other Findings Breast ca, MTHFR gene mutation  Reproductive/Obstetrics                            Anesthesia Physical Anesthesia Plan  ASA: II  Anesthesia Plan: General   Post-op Pain Management:    Induction: Intravenous  PONV Risk Score and Plan: 4 or greater and Treatment may vary due to age or medical condition, Ondansetron, Dexamethasone, Midazolam, Propofol infusion and Scopolamine patch - Pre-op  Airway Management Planned: Oral ETT and Video Laryngoscope Planned  Additional Equipment: None  Intra-op Plan:   Post-operative Plan: Extubation in OR  Informed Consent: I have reviewed the patients History and Physical, chart, labs and discussed the procedure including the risks, benefits and alternatives for the proposed anesthesia with the patient or authorized representative who has indicated his/her understanding and acceptance.     Dental advisory given  Plan Discussed with: CRNA  Anesthesia Plan Comments: (Pt describes recent anesthesia with LMA causing sore "bruised" throat for 5 days. Somewhat limited  mouth opening and neck pain with extension, plan to use Glidescope.)      Anesthesia Quick Evaluation

## 2019-02-10 NOTE — H&P (Signed)
Subjective:     Patient ID: Kimberly Miles is a 63 y.o. female.  HPI  Here for oncoplastic reconstruction following right lumpectomy.  Presented with right breast palpable mass. MMG/US showed right breast mass at the 5:30 position 3 cmfn measuring 2.1 x 2.0 x 1.8 cm. Axilla benign. Biopsy showed IDC, ER/PR+, Her2-.   MRI demonstrated biopsy-proven carcinoma RIGHT breast, 6 o'clock, measuring 2.3 cm. NME extends from known carcinoma to the inferior skin surface with questionable skin involvement. Additional irregular mass within the LEFT UOQ measuring 5 mm suspicious for contralateral disease noted. MRI-guided biopsy LEFT breast scheduled 7.8.20.   Patient preference is breast conservation. Dr. Dalbert Batman has discussed at current size mass, may leave significant defect.   Oncotype 18. Started anastrozole neoadjuvantly. She has a bad tooth and plan to start bisphosphonate after she gets clearance from dentist.  Final pathology 2.5 cm IDC margins clear, 0/3 SLN.  Current 32 DD. Wt stable.  FH includes mother with Drue Dun. 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   Review of Systems Remainder 12 point review negative    Objective:   Physical Exam  Constitutional: She is oriented to person, place, and time.  Cardiovascular: Normal rate. Normal heart sounds Pulmonary/Chest: Effort normal. Clear to auscultation  Right > left volume Grade 3 ptosis bilateral Palpable mass left breast 6 o clock SN to nipple R 27.5 L 26 cm BW R 15 L 14 cm Nipple to IMF R 9 L 10 cm    Assessment:     Right breast LIQ cancer ER+ s/p right lumpectomy SLN    Plan:     I agree with Dr. Dalbert Batman at current size mass, this would be significant volume loss breast. Location also not favorable from aesthetic standpoint as lower pole defects often result in NAC displacement, distortion contour breast. She asked for cup size- counseled I cannot assure her cup size but I estimate lumpectomy at current mass  size plus radiation would contract breast volume 1/4 to 1/3.  Plan oncoplastic reconstruction as staged procedure with reduction 7-10 d post lumpectomy to ensure pathologic clearance.Reviewed reduction with anchor type scars, drains, post operative visits and limitations, recovery. Diminished sensation nipple and breast skin, risk of nipple loss, wound healing problems, asymmetry. Discussed will have some contraction of breast volume and increased firmness with radiation, less ptosis with aging. Discussed changes with wt gain, loss, aging. Reviewed breast lift or trying to correct NAC displacement post RT more difficult, higher risk complications  Additional risks including but not limited to bleeding, hematoma, seroma, DVT/PE, damage to deeper structures, asymmetry, need for additional surgeries, unacceptable cosmetic result reviewed.  Discussed risk COVID infectionthrough this elective surgery. Patient will receive COVID testing prior to surgery. Discussed even if patient receivesa negative test result, the tests in some cases may fail to detect the virus or patient maycontract COVID after the test.COVID 19 infectionbefore/during/aftersurgery may result in lead to a higher chance of complication and death.   Irene Limbo, MD [facility] [phone], pin [phone]

## 2019-02-11 ENCOUNTER — Other Ambulatory Visit: Payer: Self-pay

## 2019-02-11 ENCOUNTER — Ambulatory Visit (HOSPITAL_BASED_OUTPATIENT_CLINIC_OR_DEPARTMENT_OTHER)
Admission: RE | Admit: 2019-02-11 | Discharge: 2019-02-11 | Disposition: A | Discharge status: Home / Self Care | Payer: Medicare Other | Attending: Plastic Surgery | Admitting: Plastic Surgery

## 2019-02-11 ENCOUNTER — Encounter (HOSPITAL_BASED_OUTPATIENT_CLINIC_OR_DEPARTMENT_OTHER)
Admission: RE | Disposition: A | Discharge status: Home / Self Care | Payer: Self-pay | Source: Home / Self Care | Attending: Plastic Surgery

## 2019-02-11 ENCOUNTER — Ambulatory Visit (HOSPITAL_BASED_OUTPATIENT_CLINIC_OR_DEPARTMENT_OTHER): Payer: Medicare Other | Admitting: Anesthesiology

## 2019-02-11 ENCOUNTER — Encounter (HOSPITAL_BASED_OUTPATIENT_CLINIC_OR_DEPARTMENT_OTHER): Payer: Self-pay | Admitting: *Deleted

## 2019-02-11 DIAGNOSIS — N651 Disproportion of reconstructed breast: Secondary | ICD-10-CM | POA: Diagnosis not present

## 2019-02-11 DIAGNOSIS — C50311 Malignant neoplasm of lower-inner quadrant of right female breast: Secondary | ICD-10-CM | POA: Insufficient documentation

## 2019-02-11 DIAGNOSIS — N62 Hypertrophy of breast: Secondary | ICD-10-CM | POA: Diagnosis not present

## 2019-02-11 DIAGNOSIS — Z170 Estrogen receptor positive status [ER+]: Secondary | ICD-10-CM | POA: Insufficient documentation

## 2019-02-11 DIAGNOSIS — G43909 Migraine, unspecified, not intractable, without status migrainosus: Secondary | ICD-10-CM | POA: Diagnosis not present

## 2019-02-11 DIAGNOSIS — C50911 Malignant neoplasm of unspecified site of right female breast: Secondary | ICD-10-CM | POA: Diagnosis not present

## 2019-02-11 DIAGNOSIS — N6481 Ptosis of breast: Secondary | ICD-10-CM | POA: Insufficient documentation

## 2019-02-11 DIAGNOSIS — E039 Hypothyroidism, unspecified: Secondary | ICD-10-CM | POA: Diagnosis not present

## 2019-02-11 HISTORY — PX: BREAST REDUCTION SURGERY: SHX8

## 2019-02-11 SURGERY — MAMMOPLASTY, REDUCTION
Anesthesia: General | Site: Breast | Laterality: Bilateral

## 2019-02-11 MED ORDER — FENTANYL CITRATE (PF) 100 MCG/2ML IJ SOLN
INTRAMUSCULAR | Status: AC
  Filled 2019-02-11: qty 2

## 2019-02-11 MED ORDER — SCOPOLAMINE 1 MG/3DAYS TD PT72
1.0000 | MEDICATED_PATCH | Freq: Once | TRANSDERMAL | Status: AC
  Administered 2019-02-11: 1 via TRANSDERMAL

## 2019-02-11 MED ORDER — PHENYLEPHRINE HCL (PRESSORS) 10 MG/ML IV SOLN
INTRAVENOUS | Status: DC | PRN
  Administered 2019-02-11 (×5): 40 ug via INTRAVENOUS

## 2019-02-11 MED ORDER — SUGAMMADEX SODIUM 200 MG/2ML IV SOLN
INTRAVENOUS | Status: DC | PRN
  Administered 2019-02-11: 100 mg via INTRAVENOUS

## 2019-02-11 MED ORDER — LACTATED RINGERS IV SOLN
INTRAVENOUS | Status: DC
  Administered 2019-02-11 (×3): via INTRAVENOUS

## 2019-02-11 MED ORDER — KETOROLAC TROMETHAMINE 30 MG/ML IJ SOLN
INTRAMUSCULAR | Status: DC | PRN
  Administered 2019-02-11: 20 mg via INTRAVENOUS

## 2019-02-11 MED ORDER — OXYCODONE HCL 5 MG PO TABS: 5.0000 mg | ORAL_TABLET | Freq: Once | ORAL | Status: DC | PRN

## 2019-02-11 MED ORDER — FENTANYL CITRATE (PF) 100 MCG/2ML IJ SOLN
25.0000 ug | INTRAMUSCULAR | Status: DC | PRN
  Administered 2019-02-11: 50 ug via INTRAVENOUS
  Administered 2019-02-11 (×2): 25 ug via INTRAVENOUS

## 2019-02-11 MED ORDER — ACETAMINOPHEN 500 MG PO TABS
ORAL_TABLET | ORAL | Status: AC
  Filled 2019-02-11: qty 2

## 2019-02-11 MED ORDER — PROPOFOL 10 MG/ML IV BOLUS
INTRAVENOUS | Status: AC
  Filled 2019-02-11: qty 20

## 2019-02-11 MED ORDER — PROPOFOL 10 MG/ML IV BOLUS
INTRAVENOUS | Status: DC | PRN
  Administered 2019-02-11: 100 mg via INTRAVENOUS

## 2019-02-11 MED ORDER — PROMETHAZINE HCL 25 MG/ML IJ SOLN
6.2500 mg | INTRAMUSCULAR | Status: DC | PRN
  Administered 2019-02-11: 6.25 mg via INTRAVENOUS

## 2019-02-11 MED ORDER — GABAPENTIN 300 MG PO CAPS
ORAL_CAPSULE | ORAL | Status: AC
  Filled 2019-02-11: qty 1

## 2019-02-11 MED ORDER — FENTANYL CITRATE (PF) 100 MCG/2ML IJ SOLN
50.0000 ug | INTRAMUSCULAR | Status: AC | PRN
  Administered 2019-02-11 (×2): 25 ug via INTRAVENOUS
  Administered 2019-02-11: 50 ug via INTRAVENOUS

## 2019-02-11 MED ORDER — EPHEDRINE SULFATE 50 MG/ML IJ SOLN
INTRAMUSCULAR | Status: DC | PRN
  Administered 2019-02-11 (×5): 5 mg via INTRAVENOUS

## 2019-02-11 MED ORDER — GABAPENTIN 300 MG PO CAPS
300.0000 mg | ORAL_CAPSULE | ORAL | Status: AC
  Administered 2019-02-11: 300 mg via ORAL

## 2019-02-11 MED ORDER — CLINDAMYCIN PHOSPHATE 900 MG/50ML IV SOLN
900.0000 mg | INTRAVENOUS | Status: AC
  Administered 2019-02-11: 900 mg via INTRAVENOUS

## 2019-02-11 MED ORDER — OXYCODONE HCL 5 MG/5ML PO SOLN: 5.0000 mg | Freq: Once | ORAL | Status: DC | PRN

## 2019-02-11 MED ORDER — ACETAMINOPHEN 500 MG PO TABS
1000.0000 mg | ORAL_TABLET | Freq: Once | ORAL | Status: AC
  Administered 2019-02-11: 1000 mg via ORAL

## 2019-02-11 MED ORDER — SODIUM CHLORIDE (PF) 0.9 % IJ SOLN
INTRAMUSCULAR | Status: AC
  Filled 2019-02-11: qty 20

## 2019-02-11 MED ORDER — SODIUM CHLORIDE 0.9 % IV SOLN
INTRAVENOUS | Status: DC | PRN
  Administered 2019-02-11: 40 mL

## 2019-02-11 MED ORDER — ROCURONIUM BROMIDE 100 MG/10ML IV SOLN
INTRAVENOUS | Status: DC | PRN
  Administered 2019-02-11: 40 mg via INTRAVENOUS

## 2019-02-11 MED ORDER — ACETAMINOPHEN 500 MG PO TABS: 1000.0000 mg | ORAL_TABLET | ORAL | Status: DC

## 2019-02-11 MED ORDER — ONDANSETRON HCL 4 MG/2ML IJ SOLN
INTRAMUSCULAR | Status: DC | PRN
  Administered 2019-02-11: 4 mg via INTRAVENOUS

## 2019-02-11 MED ORDER — CLINDAMYCIN PHOSPHATE 900 MG/50ML IV SOLN
INTRAVENOUS | Status: AC
  Filled 2019-02-11: qty 50

## 2019-02-11 MED ORDER — MIDAZOLAM HCL 2 MG/2ML IJ SOLN
1.0000 mg | INTRAMUSCULAR | Status: DC | PRN
  Administered 2019-02-11: 1 mg via INTRAVENOUS

## 2019-02-11 MED ORDER — 0.9 % SODIUM CHLORIDE (POUR BTL) OPTIME
TOPICAL | Status: DC | PRN
  Administered 2019-02-11: 1000 mL

## 2019-02-11 MED ORDER — LIDOCAINE HCL (CARDIAC) PF 100 MG/5ML IV SOSY
PREFILLED_SYRINGE | INTRAVENOUS | Status: DC | PRN
  Administered 2019-02-11: 40 mg via INTRAVENOUS

## 2019-02-11 MED ORDER — MIDAZOLAM HCL 2 MG/2ML IJ SOLN
INTRAMUSCULAR | Status: AC
  Filled 2019-02-11: qty 2

## 2019-02-11 MED ORDER — PROPOFOL 500 MG/50ML IV EMUL
INTRAVENOUS | Status: DC | PRN
  Administered 2019-02-11: 25 ug/kg/min via INTRAVENOUS

## 2019-02-11 MED ORDER — DEXAMETHASONE SODIUM PHOSPHATE 10 MG/ML IJ SOLN
INTRAMUSCULAR | Status: DC | PRN
  Administered 2019-02-11: 4 mg via INTRAVENOUS

## 2019-02-11 MED ORDER — ACETAMINOPHEN 10 MG/ML IV SOLN: 1000.0000 mg | Freq: Once | INTRAVENOUS | Status: DC | PRN

## 2019-02-11 MED ORDER — BUPIVACAINE LIPOSOME 1.3 % IJ SUSP
INTRAMUSCULAR | Status: AC
  Filled 2019-02-11: qty 20

## 2019-02-11 MED ORDER — PROMETHAZINE HCL 25 MG/ML IJ SOLN
INTRAMUSCULAR | Status: AC
  Filled 2019-02-11: qty 1

## 2019-02-11 SURGICAL SUPPLY — 63 items
ADH SKN CLS APL DERMABOND .7 (GAUZE/BANDAGES/DRESSINGS) ×2
APL PRP STRL LF DISP 70% ISPRP (MISCELLANEOUS)
APPLIER CLIP 9.375 MED OPEN (MISCELLANEOUS)
APR CLP MED 9.3 20 MLT OPN (MISCELLANEOUS)
BINDER BREAST 3XL (GAUZE/BANDAGES/DRESSINGS) IMPLANT
BINDER BREAST LRG (GAUZE/BANDAGES/DRESSINGS) IMPLANT
BINDER BREAST MEDIUM (GAUZE/BANDAGES/DRESSINGS) ×1 IMPLANT
BINDER BREAST XLRG (GAUZE/BANDAGES/DRESSINGS) IMPLANT
BINDER BREAST XXLRG (GAUZE/BANDAGES/DRESSINGS) IMPLANT
BLADE SURG 10 STRL SS (BLADE) ×8 IMPLANT
BNDG GAUZE ELAST 4 BULKY (GAUZE/BANDAGES/DRESSINGS) ×4 IMPLANT
CANISTER SUCT 1200ML W/VALVE (MISCELLANEOUS) ×2 IMPLANT
CHLORAPREP W/TINT 26 (MISCELLANEOUS) ×1 IMPLANT
CLIP APPLIE 9.375 MED OPEN (MISCELLANEOUS) IMPLANT
CLIP VESOCCLUDE MED 6/CT (CLIP) IMPLANT
COVER BACK TABLE REUSABLE LG (DRAPES) ×2 IMPLANT
COVER MAYO STAND REUSABLE (DRAPES) ×2 IMPLANT
COVER WAND RF STERILE (DRAPES) IMPLANT
DERMABOND ADVANCED (GAUZE/BANDAGES/DRESSINGS) ×2
DERMABOND ADVANCED .7 DNX12 (GAUZE/BANDAGES/DRESSINGS) ×1 IMPLANT
DRAIN CHANNEL 15F RND FF W/TCR (WOUND CARE) ×2 IMPLANT
DRAPE HALF SHEET 70X43 (DRAPES) ×3 IMPLANT
DRAPE SPLIT 6X30 W/TAPE (DRAPES) ×2 IMPLANT
DRAPE TOP ARMCOVERS (MISCELLANEOUS) ×2 IMPLANT
DRAPE UTILITY XL STRL (DRAPES) ×2 IMPLANT
DRSG PAD ABDOMINAL 8X10 ST (GAUZE/BANDAGES/DRESSINGS) ×4 IMPLANT
DURAPREP 26ML APPLICATOR (WOUND CARE) ×2 IMPLANT
ELECT COATED BLADE 2.86 ST (ELECTRODE) ×2 IMPLANT
ELECT REM PT RETURN 9FT ADLT (ELECTROSURGICAL) ×2
ELECTRODE REM PT RTRN 9FT ADLT (ELECTROSURGICAL) ×1 IMPLANT
EVACUATOR SILICONE 100CC (DRAIN) ×2 IMPLANT
GLOVE BIO SURGEON STRL SZ 6 (GLOVE) ×4 IMPLANT
GLOVE BIO SURGEON STRL SZ7 (GLOVE) ×2 IMPLANT
GLOVE BIOGEL PI IND STRL 7.0 (GLOVE) IMPLANT
GLOVE BIOGEL PI IND STRL 7.5 (GLOVE) IMPLANT
GLOVE BIOGEL PI INDICATOR 7.0 (GLOVE) ×1
GLOVE BIOGEL PI INDICATOR 7.5 (GLOVE) ×1
GOWN STRL REUS W/ TWL LRG LVL3 (GOWN DISPOSABLE) ×2 IMPLANT
GOWN STRL REUS W/TWL LRG LVL3 (GOWN DISPOSABLE) ×2
MARKER SKIN DUAL TIP RULER LAB (MISCELLANEOUS) IMPLANT
NDL HYPO 25X1 1.5 SAFETY (NEEDLE) IMPLANT
NEEDLE HYPO 25X1 1.5 SAFETY (NEEDLE) ×2 IMPLANT
NS IRRIG 1000ML POUR BTL (IV SOLUTION) ×2 IMPLANT
PACK BASIN DAY SURGERY FS (CUSTOM PROCEDURE TRAY) ×2 IMPLANT
PENCIL BUTTON HOLSTER BLD 10FT (ELECTRODE) ×2 IMPLANT
PIN SAFETY STERILE (MISCELLANEOUS) ×2 IMPLANT
SLEEVE SCD COMPRESS KNEE MED (MISCELLANEOUS) ×2 IMPLANT
SPONGE LAP 18X18 RF (DISPOSABLE) ×7 IMPLANT
STAPLER VISISTAT 35W (STAPLE) ×3 IMPLANT
SUT ETHILON 2 0 FS 18 (SUTURE) ×4 IMPLANT
SUT MNCRL AB 4-0 PS2 18 (SUTURE) ×7 IMPLANT
SUT PDS AB 2-0 CT2 27 (SUTURE) IMPLANT
SUT VIC AB 3-0 PS1 18 (SUTURE) ×8
SUT VIC AB 3-0 PS1 18XBRD (SUTURE) ×4 IMPLANT
SUT VICRYL 4-0 PS2 18IN ABS (SUTURE) ×4 IMPLANT
SYR BULB IRRIGATION 50ML (SYRINGE) ×2 IMPLANT
SYR CONTROL 10ML LL (SYRINGE) ×1 IMPLANT
TAPE MEASURE VINYL STERILE (MISCELLANEOUS) IMPLANT
TOWEL GREEN STERILE FF (TOWEL DISPOSABLE) ×4 IMPLANT
TRAY FOLEY W/BAG SLVR 14FR LF (SET/KITS/TRAYS/PACK) IMPLANT
TUBE CONNECTING 20X1/4 (TUBING) ×2 IMPLANT
UNDERPAD 30X30 (UNDERPADS AND DIAPERS) ×4 IMPLANT
YANKAUER SUCT BULB TIP NO VENT (SUCTIONS) ×2 IMPLANT

## 2019-02-11 NOTE — Transfer of Care (Signed)
Immediate Anesthesia Transfer of Care Note  Patient: Kimberly Miles  Procedure(s) Performed: RIGHT BREAST ONCOPLASTIC RECONSTRUCTION, LEFT BREAST REDUCTION (Bilateral Breast)  Patient Location: PACU  Anesthesia Type:General  Level of Consciousness: awake, alert  and oriented  Airway & Oxygen Therapy: Patient Spontanous Breathing and Patient connected to face mask oxygen  Post-op Assessment: Report given to RN  Post vital signs: Reviewed  Last Vitals:  Vitals Value Taken Time  BP    Temp    Pulse 86 02/11/19 1001  Resp 16 02/11/19 1001  SpO2 100 % 02/11/19 1001  Vitals shown include unvalidated device data.  Last Pain:  Vitals:   02/11/19 0635  TempSrc: Oral  PainSc: 1       Patients Stated Pain Goal: 1 (62/70/35 0093)  Complications: No apparent anesthesia complications

## 2019-02-11 NOTE — Op Note (Signed)
Operative Note   DATE OF OPERATION: 8.18.20  LOCATION: [facility]-outpatient  SURGICAL DIVISION: Plastic Surgery  PREOPERATIVE DIAGNOSES:  1. Right breast cancer LIQ ER+  POSTOPERATIVE DIAGNOSES:  same  PROCEDURE:  1. Right oncoplastic reconstruction 2. Left breast reduction  SURGEON: Irene Limbo MD MBA  ASSISTANT: none  ANESTHESIA:  General.   EBL: 20 ml  COMPLICATIONS: None immediate.   INDICATIONS FOR PROCEDURE:  The patient, Kimberly Miles, is a 63 y.o. female born on [birth year], is here for staged breast reconstruction following partial mastectomy.    FINDINGS: Right mastopexy 16 g, left reduction 53 g  DESCRIPTION OF PROCEDURE:  The patient was marked standing in the preoperative area to mark sternal notch, chest midline, anterior axillary lines, inframammary folds. The location of new nipple areolar complex was marked at level of on inframammary fold on anterior surface breast by palpation. This was marked symmetric over bilateral breasts. With aid of Wise pattern marker, location of new nipple areolar complex and vertical limbs (7cm) were markedby displacement of breasts along meridian.The patient was taken to the operating room. SCDs were placedand IV antibiotics, SQ heparin were given. The patient's operative site was prepped and draped in a sterile fashion. A time out was performed and all information was confirmed to be correct.   Over right breast where superior pedicle designed. The pedicle was deepithelialized. I entered inferior right breast lumpectomy cavity and drained moderate seroma. Lower pole skin excised including lumpectomy scar. Medial and lateral flaps developed. Breast tailor tacked closed.  I then directed attention to left breast, superomedial pedicle marked and nipple areolar complex marked with24mm diameter marker. Pedicle deepithlialized and developedto chest wall. Breast tissue resected over lower pole. Medial and lateral flaps  developed.Breast tailor tacked closed, and patient brought to upright sitting position and assessed for symmetry. Patent returned to supine position and breast cavities irrigated and hemostasis obtained. Exparel infiltrated throughout each breast. 15 Fr JP placed in each breast and secured with 2-0 nylon. Closure completed bilateralwith 3-0 vicryl to approximate dermis along inframammary fold and vertical limb. NAC inset with 4-0 vicryl in dermis. Skin closure completed with 4-0 monocryl subcuticular throughout. Tissue adhesive applied. Dry dressing and breast binder applied.  The patient was allowed to wake from anesthesia, extubated and taken to the recovery room in satisfactory condition.   SPECIMENS: right mastopexy, left breast reduction  DRAINS: 15 Fr JP in right and left breast  Irene Limbo, MD [facility] [phone], pin [phone]

## 2019-02-11 NOTE — Anesthesia Procedure Notes (Addendum)
Procedure Name: Intubation Date/Time: 02/11/2019 7:34 AM Performed by: Lavonia Dana, CRNA Pre-anesthesia Checklist: Patient identified, Emergency Drugs available, Suction available and Patient being monitored Patient Re-evaluated:Patient Re-evaluated prior to induction Oxygen Delivery Method: Circle system utilized Preoxygenation: Pre-oxygenation with 100% oxygen Induction Type: IV induction Ventilation: Mask ventilation without difficulty Laryngoscope Size: Glidescope Grade View: Grade I Tube type: Oral Tube size: 7.0 mm Number of attempts: 1 Airway Equipment and Method: Stylet,  Oral airway and Video-laryngoscopy Placement Confirmation: ETT inserted through vocal cords under direct vision,  positive ETCO2 and breath sounds checked- equal and bilateral Secured at: 21 cm Tube secured with: Tape Dental Injury: Teeth and Oropharynx as per pre-operative assessment

## 2019-02-11 NOTE — Interval H&P Note (Signed)
History and Physical Interval Note:  02/11/2019 6:46 AM  Kimberly Miles  has presented today for surgery, with the diagnosis of right breast ca, LIQ ER positive.  The various methods of treatment have been discussed with the patient and family. After consideration of risks, benefits and other options for treatment, the patient has consented to  Procedure(s): RIGHT BREAST ONCOPLASTIC RECONSTRUCTION, LEFT BREAST REDUCTION (Bilateral) as a surgical intervention.  The patient's history has been reviewed, patient examined, no change in status, stable for surgery.  I have reviewed the patient's chart and labs.  Questions were answered to the patient's satisfaction.     Arnoldo Hooker Merrin Mcvicker

## 2019-02-11 NOTE — Anesthesia Postprocedure Evaluation (Signed)
Anesthesia Post Note  Patient: Kimberly Miles  Procedure(s) Performed: RIGHT BREAST ONCOPLASTIC RECONSTRUCTION, LEFT BREAST REDUCTION (Bilateral Breast)     Patient location during evaluation: PACU Anesthesia Type: General Level of consciousness: awake and alert and oriented Pain management: pain level controlled Vital Signs Assessment: post-procedure vital signs reviewed and stable Respiratory status: spontaneous breathing, nonlabored ventilation and respiratory function stable Cardiovascular status: blood pressure returned to baseline Postop Assessment: no apparent nausea or vomiting Anesthetic complications: no    Last Vitals:  Vitals:   02/11/19 1015 02/11/19 1024  BP: (!) 109/53   Pulse: 82 82  Resp: 19 12  Temp:    SpO2: 100% 100%    Last Pain:  Vitals:   02/11/19 1024  TempSrc:   PainSc: [place]

## 2019-02-11 NOTE — Discharge Instructions (Signed)
JP Drain [employer] this sheet to all of your post-operative appointments while you have your drains.  Please measure your drains by CC's or ML's.  Make sure you drain and measure your JP Drains 2 or 3 times per day.  At the end of each day, add up totals for the left side and add up totals for the right side.    ( 9 am )     ( 3 pm )        ( 9 pm )                Date L  R  L  R  L  R  Total L/R                                                                                                                                                                                        Post Anesthesia Home Care Instructions  Activity: Get plenty of rest for the remainder of the day. A responsible individual must stay with you for 24 hours following the procedure.  For the next 24 hours, DO NOT: -Drive a car -Paediatric nurse -Drink alcoholic beverages -Take any medication unless instructed by your physician -Make any legal decisions or sign important papers.  Meals: Start with liquid foods such as gelatin or soup. Progress to regular foods as tolerated. Avoid greasy, spicy, heavy foods. If nausea and/or vomiting occur, drink only clear liquids until the nausea and/or vomiting subsides. Call your physician if vomiting continues.  Special Instructions/Symptoms: Your throat may feel dry or sore from the anesthesia or the breathing tube placed in your throat during surgery. If this causes discomfort, gargle with warm salt water. The discomfort should disappear within 24 hours.  If you had a scopolamine patch placed behind your ear for the management of post- operative nausea and/or vomiting:  1. The medication in the patch is effective for 72 hours, after which it should be removed.  Wrap patch in a tissue and discard in the trash. Wash hands thoroughly with soap and water. 2. You may remove the patch earlier than 72 hours if you experience unpleasant side effects which  may include dry mouth, dizziness or visual disturbances. 3. Avoid touching the patch. Wash your hands with soap and water after contact with the patch.    Information for Discharge Teaching: EXPAREL (bupivacaine liposome injectable suspension)   Your surgeon or anesthesiologist gave you EXPAREL(bupivacaine) to help control your pain after surgery.   EXPAREL is a local anesthetic that provides pain relief by numbing the tissue around the surgical site.  EXPAREL is  designed to release pain medication over time and can control pain for up to 72 hours.  Depending on how you respond to EXPAREL, you may require less pain medication during your recovery.  Possible side effects:  Temporary loss of sensation or ability to move in the area where bupivacaine was injected.  Nausea, vomiting, constipation  Rarely, numbness and tingling in your mouth or lips, lightheadedness, or anxiety may occur.  Call your doctor right away if you think you may be experiencing any of these sensations, or if you have other questions regarding possible side effects.  Follow all other discharge instructions given to you by your surgeon or nurse. Eat a healthy diet and drink plenty of water or other fluids.  If you return to the hospital for any reason within 96 hours following the administration of EXPAREL, it is important for health care providers to know that you have received this anesthetic. A teal colored band has been placed on your arm with the date, time and amount of EXPAREL you have received in order to alert and inform your health care providers. Please leave this armband in place for the full 96 hours following administration, and then you may remove the band.

## 2019-02-12 ENCOUNTER — Encounter (HOSPITAL_BASED_OUTPATIENT_CLINIC_OR_DEPARTMENT_OTHER): Payer: Self-pay | Admitting: Plastic Surgery

## 2019-02-12 NOTE — Progress Notes (Signed)
HEMATOLOGY-ONCOLOGY DOXIMITY VISIT PROGRESS NOTE  I connected with Kimberly Miles on 02/13/2019 at  1:45 PM EDT by Doximity video conference and verified that I am speaking with the correct person using two identifiers.  I discussed the limitations, risks, security and privacy concerns of performing an evaluation and management service by Doximity and the availability of in person appointments.  I also discussed with the patient that there may be a patient responsible charge related to this service. The patient expressed understanding and agreed to proceed.  Patient's Location: Home Physician Location: Clinic  CHIEF COMPLIANT: Follow-up s/p lumpectomy to review pathology  INTERVAL HISTORY: Kimberly Miles is a 63 y.o. female with above-mentioned history of stage Ib right breast cancer who was on neoadjuvant antiestrogen therapy with anastrozole and underwent a lumpectomy with Dr. Dalbert Batman on 02/04/19. 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 She presents over Doximity today to review the pathology report.   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     REVIEW OF SYSTEMS:   Constitutional: Denies fevers, chills or abnormal weight loss Eyes: Denies blurriness of vision Ears, nose, mouth, throat, and face: Denies mucositis or sore throat  Respiratory: Denies cough, dyspnea or wheezes Cardiovascular: Denies palpitation, chest discomfort Gastrointestinal:  Denies nausea, heartburn or change in bowel habits Skin: Denies abnormal skin rashes Lymphatics: Denies new lymphadenopathy or easy bruising Neurological:Denies numbness, tingling or new weaknesses Behavioral/Psych: Mood is stable, no new changes  Extremities: No lower extremity edema Breast: s/p right lumpectomy All other systems were reviewed with the patient and are negative.  Observations/Objective:  There were no vitals filed for this visit. There is no height or weight on file to calculate BMI.  I have reviewed the data as listed CMP Latest Ref Rng & Units 12/30/2018 09/26/2017 03/22/2017  Glucose 65 - 99 mg/dL 84 77 97  BUN 8 - 27 mg/dL 7(L) 12 10  Creatinine 0.57 - 1.00 mg/dL 0.66 0.55 0.69  Sodium 134 - 144 mmol/L 140 138 140  Potassium 3.5 - 5.2 mmol/L 3.8 4.3 4.1  Chloride 96 - 106 mmol/L 100 101 105  CO2 20 - 29 mmol/L _0 Calcium 8.7 - 10.3 mg/dL 9.3 9.2 8.9  Total Protein 6.0 - 8.5 g/dL 6.3 6.6 6.1  Total Bilirubin 0.0 - 1.2 mg/dL 0.2 0.5 0.4  Alkaline Phos 39 - 117 IU/L 71 86 72  AST 0 - 40 IU/L _1 ALT 0 - 32 IU/L 31 37(H) 47(H)    Lab Results  Component Value Date   WBC 5.9 03/22/2017   HGB 14.4 03/22/2017   HCT 43.3 03/22/2017   MCV 97.8 03/22/2017   PLT 210.0 03/22/2017   NEUTROABS 4.0 03/22/2017      Assessment Plan:  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  ER PR positive HER-2 negative, Ki-67 50%   Pathology counseling: I discussed the final pathology report of the patient provided  a copy of this report. I discussed the margins as well as lymph node surgeries. We also discussed the final staging along with previously performed ER/PR and HER-2/neu testing.   Treatment plan: Adjuvant radiation therapy followed by adjuvant antiestrogen therapy I will see her at the end of radiation. Surveillance: Because her tumor was not easily detectable through regular mammograms, we discussed about doing surveillance with breast MRIs in addition to breast mammograms. Her next mammogram will be done next year in June 2021.  Breast MRI will be done November 2021. We may do MRIs for couple of years and then perform it every other year.   I discussed the assessment and treatment plan with the patient. The patient was provided an opportunity to ask questions and all were answered. The patient agreed with the plan and demonstrated an understanding of the instructions. The patient was advised to call back or seek an in-person evaluation if the symptoms worsen or if the condition fails to improve as anticipated.   I provided 15 minutes of face-to-face Doximity time during this encounter.    Rulon Eisenmenger, MD 02/13/2019   I, Molly Dorshimer, am acting as scribe for Nicholas Lose, MD.  I have reviewed the above documentation for accuracy and completeness, and I agree with the above.

## 2019-02-13 ENCOUNTER — Inpatient Hospital Stay: Payer: Medicare Other | Attending: Hematology and Oncology | Admitting: Hematology and Oncology

## 2019-02-13 DIAGNOSIS — Z170 Estrogen receptor positive status [ER+]: Secondary | ICD-10-CM | POA: Diagnosis not present

## 2019-02-13 DIAGNOSIS — C50311 Malignant neoplasm of lower-inner quadrant of right female breast: Secondary | ICD-10-CM

## 2019-02-13 DIAGNOSIS — Z483 Aftercare following surgery for neoplasm: Secondary | ICD-10-CM | POA: Diagnosis not present

## 2019-02-13 DIAGNOSIS — Z79811 Long term (current) use of aromatase inhibitors: Secondary | ICD-10-CM | POA: Diagnosis not present

## 2019-02-18 DIAGNOSIS — Z79811 Long term (current) use of aromatase inhibitors: Secondary | ICD-10-CM | POA: Diagnosis not present

## 2019-02-18 DIAGNOSIS — C50311 Malignant neoplasm of lower-inner quadrant of right female breast: Secondary | ICD-10-CM | POA: Diagnosis not present

## 2019-02-18 DIAGNOSIS — Z483 Aftercare following surgery for neoplasm: Secondary | ICD-10-CM | POA: Diagnosis not present

## 2019-02-18 DIAGNOSIS — Z170 Estrogen receptor positive status [ER+]: Secondary | ICD-10-CM | POA: Diagnosis not present

## 2019-02-25 DIAGNOSIS — C50311 Malignant neoplasm of lower-inner quadrant of right female breast: Secondary | ICD-10-CM | POA: Diagnosis not present

## 2019-02-25 DIAGNOSIS — Z170 Estrogen receptor positive status [ER+]: Secondary | ICD-10-CM | POA: Diagnosis not present

## 2019-02-25 DIAGNOSIS — Z483 Aftercare following surgery for neoplasm: Secondary | ICD-10-CM | POA: Diagnosis not present

## 2019-02-25 DIAGNOSIS — Z79811 Long term (current) use of aromatase inhibitors: Secondary | ICD-10-CM | POA: Diagnosis not present

## 2019-02-26 NOTE — Progress Notes (Signed)
error 

## 2019-02-28 ENCOUNTER — Encounter (HOSPITAL_COMMUNITY): Payer: Medicare Other

## 2019-03-04 ENCOUNTER — Ambulatory Visit: Payer: Medicare Other

## 2019-03-04 ENCOUNTER — Other Ambulatory Visit: Payer: Self-pay

## 2019-03-04 ENCOUNTER — Ambulatory Visit
Admission: RE | Admit: 2019-03-04 | Discharge: 2019-03-04 | Disposition: A | Discharge status: Home / Self Care | Payer: Medicare Other | Source: Ambulatory Visit | Attending: Radiation Oncology | Admitting: Radiation Oncology

## 2019-03-05 NOTE — Progress Notes (Signed)
Location of Breast Cancer: Right Breast  Histology per Pathology Report:  12/09/18 Diagnosis Breast, right, needle core biopsy, 5:30 o'clock - INVASIVE DUCTAL CARCINOMA  Receptor Status: ER(100%), PR (50%), Her2-neu (NEG), Ki-(50%)  02/04/19 Diagnosis 1. Breast, lumpectomy, Right w/seed - INVASIVE DUCTAL CARCINOMA, GRADE I/III, SPANNING 2.5 CM. - THE SURGICAL RESECTION MARGINS ARE NEGATIVE FOR CARCINOMA. - SEE ONCOLOGY TABLE BELOW. 2. Lymph node, sentinel, biopsy, Right - THERE IS NO EVIDENCE OF CARCINOMA IN 1 OF 1 LYMPH NODE (0/1). - SEE COMMENT. 3. Lymph node, sentinel, biopsy, Right - THERE IS NO EVIDENCE OF CARCINOMA IN 1 OF 1 LYMPH NODE (0/1). - SEE COMMENT. 4. Lymph node, sentinel, biopsy, Right - THERE IS NO EVIDENCE OF CARCINOMA IN 1 OF 1 LYMPH NODE (0/1).  02/11/19 Diagnosis 1. Breast, Mammoplasty, Right - BENIGN BREAST PARENCHYMA. - THERE IS NO EVIDENCE OF MALIGNANCY. 2. Breast, Mammoplasty, Left - BENIGN BREAST PARENCHYMA. - THERE IS NO EVIDENCE OF MALIGNANCY.  Did patient present with symptoms or was this found on screening mammography?: She presented to her GYN on 11/29/18 reporting dimpling to her right breast.   Past/Anticipated interventions by surgeon, if any: 02/04/19 Procedure:                 Inject blue dye right breast, right breast lumpectomy including overlying skin, right axillary deep sentinel lymph node biopsy Surgeon:                     Edsel Petrin. Dalbert Batman, M.D., FACS  02/11/19 PROCEDURE:  1. Right oncoplastic reconstruction 2. Left breast reduction SURGEON: Irene Limbo MD MBA   Past/Anticipated interventions by medical oncology, if any:  02/13/19 [name] Treatment plan: Adjuvant radiation therapy followed by adjuvant antiestrogen therapy I will see her at the end of radiation. Surveillance: Because her tumor was not easily detectable through regular mammograms, we discussed about doing surveillance with breast MRIs in addition to  breast mammograms. Her next mammogram will be done next year in June 2021.  Breast MRI will be done November 2021. We may do MRIs for couple of years and then perform it every other year.  I discussed the assessment and treatment plan with the patient. The patient was provided an opportunity to ask questions and all were answered. The patient agreed with the plan and demonstrated an understanding of the instructions. The patient was advised to call back or seek an in-person evaluation if the symptoms worsen or if the condition fails to improve as anticipated.    Lymphedema issues, if any:  She denies. She has good arm mobility.   Pain issues, if any:  She reports soreness to her surgery site.   SAFETY ISSUES:  Prior radiation? No  Pacemaker/ICD? No  Possible current pregnancy? No  Is the patient on methotrexate? No  Current Complaints / other details:

## 2019-03-09 ENCOUNTER — Other Ambulatory Visit: Payer: Self-pay | Admitting: Obstetrics & Gynecology

## 2019-03-10 ENCOUNTER — Other Ambulatory Visit: Payer: Self-pay | Admitting: Family Medicine

## 2019-03-10 DIAGNOSIS — G43009 Migraine without aura, not intractable, without status migrainosus: Secondary | ICD-10-CM

## 2019-03-10 NOTE — Telephone Encounter (Signed)
Medication refill request: euthyrox Last OV:  01/16/19 Next AEX: 10/03/19 Last MMG (if hormonal medication request): Bi-rads 5 highly suggestive of malignancy  Refill authorized: #30 with 0 RF

## 2019-03-11 ENCOUNTER — Other Ambulatory Visit: Payer: Self-pay

## 2019-03-11 ENCOUNTER — Encounter: Payer: Self-pay | Admitting: Radiation Oncology

## 2019-03-11 ENCOUNTER — Ambulatory Visit
Admission: RE | Admit: 2019-03-11 | Discharge: 2019-03-11 | Disposition: A | Discharge status: Home / Self Care | Payer: Medicare Other | Source: Ambulatory Visit | Attending: Radiation Oncology | Admitting: Radiation Oncology

## 2019-03-11 DIAGNOSIS — Z170 Estrogen receptor positive status [ER+]: Secondary | ICD-10-CM

## 2019-03-11 DIAGNOSIS — C50311 Malignant neoplasm of lower-inner quadrant of right female breast: Secondary | ICD-10-CM

## 2019-03-11 DIAGNOSIS — Z9889 Other specified postprocedural states: Secondary | ICD-10-CM | POA: Diagnosis not present

## 2019-03-11 NOTE — Progress Notes (Signed)
Radiation Oncology         [id number]) [phone] ________________________________  Name: Kimberly Miles MRN: [id number]  Date: 03/11/2019  DOB: 1955-07-18  Follow-Up WebEx Note due to pandemic precautions  Outpatient  CC: Carollee Herter, Alferd Apa, DO  Fanny Skates, MD  Diagnosis:      ICD-10-CM   1. 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 She was seen in consultation on 12/24/2018.     Since consultation date, she underwent biopsy of the irregular upper-outer left breast mass seen on breast MRI from 12/18/2018 on 01/01/2019. Pathology was negative for malignancy.  She opted to proceed with right breast lumpectomy with sentinel lymph node biopsy on date of 02/04/2019 with pathology report revealing: tumor size of 2.5 cm; histology of invasive ductal carcinoma; margin status to invasive disease of greater than 0.2 cm; nodal status of negative (0/3); Grade 1. Prognostic panel was not repeated (ER+, PR+, Her2-).    She also underwent right breast reconstruction with left breast reduction on 02/11/2019 under Dr. Iran Planas. Pathology was negative for malignancy.  She last saw [name] on 02/13/2019. Plan is to begin antiestrogen therapy following adjuvant radiation therapy. She will also be followed with surveillance breast MRIs, in addition to regular  mammograms, given that her tumor was not easily detectable on mammogram.  She last saw Dr. Iran Planas on 02/19/2019. Her surgical drains were removed at this time.  Symptomatically, the patient reports: She is doing well.  She has a follow-up this week with Dr. Iran Planas to check on how she is healing.  She plans to go to the beach all of next week.  Lymphedema issues, if any:  She denies. She has good arm mobility.   Pain issues, if any:  She reports soreness to her surgery site.  No excessive swelling.  No bruising from scars.  Oncotype results determined that she will not need chemotherapy and she has already started antiestrogen therapy and is tolerating that well.         ALLERGIES:  is allergic to codeine; demerol [meperidine]; erythromycin; sulfa antibiotics; ampicillin; and chlorhexidine.  Meds: Current Outpatient Medications  Medication Sig Dispense Refill  . anastrozole (ARIMIDEX) 1 MG tablet Take 1 tablet (1 mg total) by mouth daily. 90 tablet 3  . Ascorbic Acid (VITAMIN C) 1000 MG tablet Take 1,000 mg by mouth daily. 2 tablet daily    . butalbital-aspirin-caffeine (FIORINAL) 50-325-40 MG capsule Take 1 capsule by mouth every 6 (six) hours as needed for headache. 8 capsule 0  . Cholecalciferol (VITAMIN D3) 5000 UNITS CAPS Take 1 capsule by mouth daily.    . cyclobenzaprine (FLEXERIL) 5 MG tablet Take 1 tablet by mouth daily as needed.     . iodine-sodium iodide 2-2.4 % solution Apply topically once.     Marland Kitchen levothyroxine (SYNTHROID) 75 MCG tablet Take 1 tablet (75 mcg total)  by mouth daily before breakfast. 30 tablet 1  . Melatonin 3 MG TABS Take 3 mg by mouth at bedtime.     . rizatriptan (MAXALT) 10 MG tablet Take 1 tablet (10 mg total) by mouth as needed for migraine. May repeat in 2 hours if needed 12 tablet 1  . UNABLE TO FIND Med Name: magnesium spray, she makes herself from magnesium salts and water.  She sprays it on topically    . zinc gluconate 50 MG tablet Take 50 mg by  mouth daily.    Marland Kitchen HYDROcodone-acetaminophen (NORCO) 5-325 MG tablet Take 1-2 tablets by mouth every 6 (six) hours as needed for moderate pain or severe pain. (Patient not taking: Reported on 03/11/2019) 20 tablet 0  . zolpidem (AMBIEN) 5 MG tablet Take 1 tablet (5 mg total) by mouth at bedtime as needed for sleep. (Patient not taking: Reported on 03/11/2019) 30 tablet 0   No current facility-administered medications for this encounter.     Physical Findings:  vitals were not taken for this visit. .     General: Alert and oriented, in no acute distress  Psychiatric: Judgment and insight are intact. Affect is appropriate.   Lab Findings: Lab Results  Component Value Date   WBC 5.9 03/22/2017   HGB 14.4 03/22/2017   HCT 43.3 03/22/2017   MCV 97.8 03/22/2017   PLT 210.0 03/22/2017    @LASTCHEMISTRY @  Radiographic Findings: No results found.  Impression/Plan: Right Breast Cancer  We discussed adjuvant radiotherapy today.  I recommend 3-4 weeks directed at the right breast in order to reduce risk of local regional recurrence by two thirds.  The risks, benefits and side effects of this treatment were discussed in detail.  She understands that radiotherapy is associated with skin irritation and fatigue in the acute setting. Late effects can include cosmetic changes and rare injury to internal organs.  She is enthusiastic about proceeding with treatment.   Simulation will take place on Friday, September 18.  When I look at her CT simulation images I will determine whether she should receive 16 fractions to the whole breast without a boost, or 15 fractions to the breast with a 5 fraction boost.  This will depend upon whether her lumpectomy cavity is still discernible after breast oncoplastic surgery.  A total of 3 medically necessary complex treatment devices will be fabricated and supervised by me: 2 fields with MLCs for custom blocks to protect heart, and lungs;  and, a Vac-lok. MORE COMPLEX  DEVICES MAY BE MADE IN DOSIMETRY FOR FIELD IN FIELD BEAMS FOR DOSE HOMOGENEITY.  I have requested : 3D Simulation which is medically necessary to give adequate dose to at risk tissues while sparing lungs and heart.  I have requested a DVH of the following structures: lungs, heart, right lumpectomy cavity.    This encounter was provided by telemedicine platform Webex.  The patient has given verbal consent for this type of encounter and has been advised to only accept a meeting of this type in a secure network environment. The time spent during this encounter was over 20 minutes. The attendants for this meeting include Eppie Gibson  and [name].  During the encounter, Eppie Gibson was located at [facility] Radiation Oncology Department.  Anakaren Campion was located at home.  _____________________________________   Eppie Gibson, MD   This document serves as a record of services personally performed by Eppie Gibson, MD. It was created on her behalf by Joellen Jersey  [place], a trained medical scribe. The creation of this record is based on the scribe's personal observations and the provider's statements to them. This document has been checked and approved by the attending provider.

## 2019-03-14 ENCOUNTER — Ambulatory Visit
Admission: RE | Admit: 2019-03-14 | Discharge: 2019-03-14 | Disposition: A | Discharge status: Home / Self Care | Payer: Medicare Other | Source: Ambulatory Visit | Attending: Radiation Oncology | Admitting: Radiation Oncology

## 2019-03-14 ENCOUNTER — Other Ambulatory Visit: Payer: Self-pay

## 2019-03-14 DIAGNOSIS — Z170 Estrogen receptor positive status [ER+]: Secondary | ICD-10-CM | POA: Diagnosis not present

## 2019-03-14 DIAGNOSIS — C50311 Malignant neoplasm of lower-inner quadrant of right female breast: Secondary | ICD-10-CM

## 2019-03-14 DIAGNOSIS — Z510 Encounter for antineoplastic radiation therapy: Secondary | ICD-10-CM | POA: Insufficient documentation

## 2019-03-14 NOTE — Progress Notes (Signed)
  Radiation Oncology         [phone] ________________________________  Name: Kimberly Miles MRN: [id number]  Date: 03/14/2019  DOB: 1956-05-27  SIMULATION AND TREATMENT PLANNING NOTE    Outpatient  DIAGNOSIS:     ICD-10-CM   1. 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, estrogen receptor positive ([place])  C50.311    Z17.0     NARRATIVE:  The patient was brought to the [facility].  Identity was confirmed.  All relevant records and images related to the planned course of therapy were reviewed.  The patient freely provided informed written consent to proceed with treatment after reviewing the details related to the planned course of therapy. The consent form was witnessed and verified by the simulation staff.  Scars have healed well over breast.  Then, the patient was set-up in a stable reproducible supine position for radiation therapy with her ipsilateral arm over her head, and her upper body secured in a custom-made Vac-lok device.  CT images were obtained.  Surface markings were placed.  The CT images were loaded into the planning software.    TREATMENT PLANNING NOTE: Treatment planning then occurred.  The radiation prescription was entered and confirmed.     A total of 3 medically necessary complex treatment devices were fabricated and supervised by me: 2 fields with MLCs for custom blocks to protect heart, and lungs;  and, a Vac-lok. MORE COMPLEX DEVICES MAY BE MADE IN DOSIMETRY FOR FIELD IN FIELD BEAMS FOR DOSE HOMOGENEITY.  I have requested : 3D Simulation which is medically necessary to give adequate dose to at risk tissues while sparing lungs and heart.  I have requested a DVH of the following structures: lungs, heart, approximate lumpectomy cavity.    The patient will receive 42.56 Gy in 16 fractions to the right breast with 2 tangential fields.   This will not be followed by a boost.  Optical Surface Tracking Plan:  Since intensity  modulated radiotherapy (IMRT) and 3D conformal radiation treatment methods are predicated on accurate and precise positioning for treatment, intrafraction motion monitoring is medically necessary to ensure accurate and safe treatment delivery. The ability to quantify intrafraction motion without excessive ionizing radiation dose can only be performed with optical surface tracking. Accordingly, surface imaging offers the opportunity to obtain 3D measurements of patient position throughout IMRT and 3D treatments without excessive radiation exposure. I am ordering optical surface tracking for this patient's upcoming course of radiotherapy.  ________________________________   Reference:  Ursula Alert, J, et al. Surface imaging-based analysis of intrafraction motion for breast radiotherapy patients.Journal of [employer], n. 6, nov. 2014. ISSN [id number].  Available at: <http://www.jacmp.org/index.php/jacmp/article/view/4957>.    -----------------------------------  Eppie Gibson, MD

## 2019-03-17 ENCOUNTER — Encounter: Payer: Self-pay | Admitting: *Deleted

## 2019-03-17 DIAGNOSIS — Z170 Estrogen receptor positive status [ER+]: Secondary | ICD-10-CM | POA: Diagnosis not present

## 2019-03-17 DIAGNOSIS — Z510 Encounter for antineoplastic radiation therapy: Secondary | ICD-10-CM | POA: Diagnosis not present

## 2019-03-17 DIAGNOSIS — C50311 Malignant neoplasm of lower-inner quadrant of right female breast: Secondary | ICD-10-CM | POA: Diagnosis not present

## 2019-03-18 ENCOUNTER — Telehealth: Payer: Self-pay | Admitting: Hematology and Oncology

## 2019-03-18 NOTE — Telephone Encounter (Signed)
Scheduled appt per 9/21 sch message - pt is aware of appt date and time

## 2019-03-24 ENCOUNTER — Ambulatory Visit
Admission: RE | Admit: 2019-03-24 | Discharge: 2019-03-24 | Disposition: A | Discharge status: Home / Self Care | Payer: Medicare Other | Source: Ambulatory Visit | Attending: Radiation Oncology | Admitting: Radiation Oncology

## 2019-03-24 ENCOUNTER — Other Ambulatory Visit: Payer: Self-pay

## 2019-03-24 VITALS — BP 126/63 | HR 69 | Temp 98.3°F | Resp 18 | Ht 59.0 in

## 2019-03-24 DIAGNOSIS — C50311 Malignant neoplasm of lower-inner quadrant of right female breast: Secondary | ICD-10-CM

## 2019-03-24 DIAGNOSIS — Z170 Estrogen receptor positive status [ER+]: Secondary | ICD-10-CM | POA: Diagnosis not present

## 2019-03-24 DIAGNOSIS — Z510 Encounter for antineoplastic radiation therapy: Secondary | ICD-10-CM | POA: Diagnosis not present

## 2019-03-24 MED ORDER — RADIAPLEXRX EX GEL
Freq: Once | CUTANEOUS | Status: AC
  Administered 2019-03-24: 12:00:00 via TOPICAL

## 2019-03-24 MED ORDER — ALRA NON-METALLIC DEODORANT (RAD-ONC)
1.0000 "application " | Freq: Once | TOPICAL | Status: AC
  Administered 2019-03-24: 1 via TOPICAL

## 2019-03-25 ENCOUNTER — Ambulatory Visit: Payer: Medicare Other

## 2019-03-25 ENCOUNTER — Other Ambulatory Visit: Payer: Self-pay

## 2019-03-25 ENCOUNTER — Ambulatory Visit: Payer: Medicare Other | Admitting: Radiation Oncology

## 2019-03-25 ENCOUNTER — Ambulatory Visit
Admission: RE | Admit: 2019-03-25 | Discharge: 2019-03-25 | Disposition: A | Discharge status: Home / Self Care | Payer: Medicare Other | Source: Ambulatory Visit | Attending: Radiation Oncology | Admitting: Radiation Oncology

## 2019-03-25 DIAGNOSIS — Z510 Encounter for antineoplastic radiation therapy: Secondary | ICD-10-CM | POA: Diagnosis not present

## 2019-03-25 DIAGNOSIS — Z170 Estrogen receptor positive status [ER+]: Secondary | ICD-10-CM | POA: Diagnosis not present

## 2019-03-25 DIAGNOSIS — C50311 Malignant neoplasm of lower-inner quadrant of right female breast: Secondary | ICD-10-CM | POA: Diagnosis not present

## 2019-03-26 ENCOUNTER — Ambulatory Visit
Admission: RE | Admit: 2019-03-26 | Discharge: 2019-03-26 | Disposition: A | Discharge status: Home / Self Care | Payer: Medicare Other | Source: Ambulatory Visit | Attending: Radiation Oncology | Admitting: Radiation Oncology

## 2019-03-26 ENCOUNTER — Other Ambulatory Visit: Payer: Self-pay

## 2019-03-26 DIAGNOSIS — Z510 Encounter for antineoplastic radiation therapy: Secondary | ICD-10-CM | POA: Diagnosis not present

## 2019-03-26 DIAGNOSIS — Z170 Estrogen receptor positive status [ER+]: Secondary | ICD-10-CM | POA: Diagnosis not present

## 2019-03-26 DIAGNOSIS — C50311 Malignant neoplasm of lower-inner quadrant of right female breast: Secondary | ICD-10-CM | POA: Diagnosis not present

## 2019-03-27 ENCOUNTER — Ambulatory Visit
Admission: RE | Admit: 2019-03-27 | Discharge: 2019-03-27 | Disposition: A | Discharge status: Home / Self Care | Payer: Medicare Other | Source: Ambulatory Visit | Attending: Radiation Oncology | Admitting: Radiation Oncology

## 2019-03-27 ENCOUNTER — Other Ambulatory Visit: Payer: Self-pay

## 2019-03-27 DIAGNOSIS — Z510 Encounter for antineoplastic radiation therapy: Secondary | ICD-10-CM | POA: Insufficient documentation

## 2019-03-27 DIAGNOSIS — C50311 Malignant neoplasm of lower-inner quadrant of right female breast: Secondary | ICD-10-CM | POA: Insufficient documentation

## 2019-03-27 DIAGNOSIS — Z170 Estrogen receptor positive status [ER+]: Secondary | ICD-10-CM | POA: Diagnosis not present

## 2019-03-28 ENCOUNTER — Other Ambulatory Visit: Payer: Self-pay

## 2019-03-28 ENCOUNTER — Ambulatory Visit
Admission: RE | Admit: 2019-03-28 | Discharge: 2019-03-28 | Disposition: A | Discharge status: Home / Self Care | Payer: Medicare Other | Source: Ambulatory Visit | Attending: Radiation Oncology | Admitting: Radiation Oncology

## 2019-03-28 DIAGNOSIS — Z510 Encounter for antineoplastic radiation therapy: Secondary | ICD-10-CM | POA: Diagnosis not present

## 2019-03-28 DIAGNOSIS — C50311 Malignant neoplasm of lower-inner quadrant of right female breast: Secondary | ICD-10-CM | POA: Diagnosis not present

## 2019-03-28 DIAGNOSIS — Z170 Estrogen receptor positive status [ER+]: Secondary | ICD-10-CM | POA: Diagnosis not present

## 2019-03-31 ENCOUNTER — Ambulatory Visit
Admission: RE | Admit: 2019-03-31 | Discharge: 2019-03-31 | Disposition: A | Discharge status: Home / Self Care | Payer: Medicare Other | Source: Ambulatory Visit | Attending: Radiation Oncology | Admitting: Radiation Oncology

## 2019-03-31 ENCOUNTER — Other Ambulatory Visit: Payer: Self-pay

## 2019-03-31 DIAGNOSIS — Z170 Estrogen receptor positive status [ER+]: Secondary | ICD-10-CM | POA: Diagnosis not present

## 2019-03-31 DIAGNOSIS — C50311 Malignant neoplasm of lower-inner quadrant of right female breast: Secondary | ICD-10-CM | POA: Diagnosis not present

## 2019-03-31 DIAGNOSIS — Z510 Encounter for antineoplastic radiation therapy: Secondary | ICD-10-CM | POA: Diagnosis not present

## 2019-04-01 ENCOUNTER — Ambulatory Visit
Admission: RE | Admit: 2019-04-01 | Discharge: 2019-04-01 | Disposition: A | Discharge status: Home / Self Care | Payer: Medicare Other | Source: Ambulatory Visit | Attending: Radiation Oncology | Admitting: Radiation Oncology

## 2019-04-01 ENCOUNTER — Other Ambulatory Visit: Payer: Self-pay

## 2019-04-01 DIAGNOSIS — Z510 Encounter for antineoplastic radiation therapy: Secondary | ICD-10-CM | POA: Diagnosis not present

## 2019-04-01 DIAGNOSIS — Z170 Estrogen receptor positive status [ER+]: Secondary | ICD-10-CM | POA: Diagnosis not present

## 2019-04-01 DIAGNOSIS — C50311 Malignant neoplasm of lower-inner quadrant of right female breast: Secondary | ICD-10-CM | POA: Diagnosis not present

## 2019-04-02 ENCOUNTER — Ambulatory Visit
Admission: RE | Admit: 2019-04-02 | Discharge: 2019-04-02 | Disposition: A | Discharge status: Home / Self Care | Payer: Medicare Other | Source: Ambulatory Visit | Attending: Radiation Oncology | Admitting: Radiation Oncology

## 2019-04-02 ENCOUNTER — Other Ambulatory Visit: Payer: Self-pay

## 2019-04-02 DIAGNOSIS — Z510 Encounter for antineoplastic radiation therapy: Secondary | ICD-10-CM | POA: Diagnosis not present

## 2019-04-02 DIAGNOSIS — C50311 Malignant neoplasm of lower-inner quadrant of right female breast: Secondary | ICD-10-CM | POA: Diagnosis not present

## 2019-04-02 DIAGNOSIS — Z170 Estrogen receptor positive status [ER+]: Secondary | ICD-10-CM | POA: Diagnosis not present

## 2019-04-03 ENCOUNTER — Other Ambulatory Visit: Payer: Self-pay

## 2019-04-03 ENCOUNTER — Ambulatory Visit
Admission: RE | Admit: 2019-04-03 | Discharge: 2019-04-03 | Disposition: A | Discharge status: Home / Self Care | Payer: Medicare Other | Source: Ambulatory Visit | Attending: Radiation Oncology | Admitting: Radiation Oncology

## 2019-04-03 DIAGNOSIS — C50311 Malignant neoplasm of lower-inner quadrant of right female breast: Secondary | ICD-10-CM | POA: Diagnosis not present

## 2019-04-03 DIAGNOSIS — Z510 Encounter for antineoplastic radiation therapy: Secondary | ICD-10-CM | POA: Diagnosis not present

## 2019-04-03 DIAGNOSIS — Z170 Estrogen receptor positive status [ER+]: Secondary | ICD-10-CM | POA: Diagnosis not present

## 2019-04-04 ENCOUNTER — Other Ambulatory Visit: Payer: Self-pay

## 2019-04-04 ENCOUNTER — Ambulatory Visit
Admission: RE | Admit: 2019-04-04 | Discharge: 2019-04-04 | Disposition: A | Discharge status: Home / Self Care | Payer: Medicare Other | Source: Ambulatory Visit | Attending: Radiation Oncology | Admitting: Radiation Oncology

## 2019-04-04 DIAGNOSIS — Z170 Estrogen receptor positive status [ER+]: Secondary | ICD-10-CM | POA: Diagnosis not present

## 2019-04-04 DIAGNOSIS — C50311 Malignant neoplasm of lower-inner quadrant of right female breast: Secondary | ICD-10-CM | POA: Diagnosis not present

## 2019-04-04 DIAGNOSIS — Z510 Encounter for antineoplastic radiation therapy: Secondary | ICD-10-CM | POA: Diagnosis not present

## 2019-04-05 NOTE — Progress Notes (Signed)
Patient Care Team: Carollee Herter, Alferd Apa, DO as PCP - General (Family Medicine) Orie Rout, MD as Referring Physician (Specialist) Elveria Rising, MD as Consulting Physician (Obstetrics and Gynecology) Izora Gala, MD as Consulting Physician (Otolaryngology) Megan Salon, MD as Consulting Physician (Gynecology) Mauro Kaufmann, RN as Oncology Nurse Navigator Rockwell Germany, RN as Oncology Nurse Navigator  DIAGNOSIS:    ICD-10-CM   1. 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   02/04/2019 Cancer Staging   Staging form: Breast, AJCC 8th Edition - Pathologic stage from 02/04/2019: Stage IA (pT2, pN0, cM0, G1, ER+, PR+, HER2-, Oncotype DX score: 18) - Signed by Gardenia Phlegm, NP on 02/19/2019   02/11/2019 Surgery   Bil Mammaplasty: benign   03/25/2019 -  Radiation Therapy   Adjuvant radiation     CHIEF COMPLIANT: Follow-up to discuss further treatment   INTERVAL HISTORY: Kimberly Miles is a 63 y.o. with above-mentioned history of right breast cancer who was on neoadjuvant antiestrogen therapy with anastrozole and underwent a lumpectomy. She is currently undergoing radiation therapy. She presents to the clinic today to discuss further treatment and  surveillance.   REVIEW OF SYSTEMS:   Constitutional: Denies fevers, chills or abnormal weight loss Eyes: Denies blurriness of vision Ears, nose, mouth, throat, and face: Denies mucositis or sore throat Respiratory: Denies cough, dyspnea or wheezes Cardiovascular: Denies palpitation, chest discomfort Gastrointestinal: Denies nausea, heartburn or change in bowel habits Skin: Denies abnormal skin rashes Lymphatics: Denies new lymphadenopathy or easy bruising Neurological: Denies numbness, tingling or new weaknesses Behavioral/Psych: Mood is stable, no new changes  Extremities: No lower extremity edema Breast: denies any pain or lumps or nodules in either breasts All other systems were reviewed with the patient and are negative.  I have reviewed the past medical history, past surgical history, social history and family history with the patient and they are unchanged from previous note.  ALLERGIES:  is allergic to codeine; demerol [meperidine]; erythromycin; sulfa antibiotics; ampicillin; and chlorhexidine.  MEDICATIONS:  Current Outpatient Medications  Medication Sig Dispense Refill  . anastrozole (ARIMIDEX) 1 MG tablet Take 1 tablet (1 mg total) by mouth daily. 90 tablet 3  . Ascorbic Acid (VITAMIN C) 1000 MG tablet Take 1,000 mg by mouth daily. 2 tablet daily    . butalbital-aspirin-caffeine (FIORINAL) 50-325-40 MG capsule Take 1 capsule by mouth every 6 (six) hours as needed for headache. 8 capsule 0  . Cholecalciferol (VITAMIN D3) 5000 UNITS CAPS Take 1 capsule by mouth daily.    . cyclobenzaprine (FLEXERIL) 5 MG tablet Take 1 tablet by mouth daily as needed.     . EUTHYROX 75 MCG tablet TAKE 1 TABLET BY MOUTH ONCE DAILY BEFORE BREAKFAST 90 tablet 2  . HYDROcodone-acetaminophen (  NORCO) 5-325 MG tablet Take 1-2 tablets by mouth every 6 (six) hours as needed for moderate pain or severe pain. (Patient not taking: Reported on 03/11/2019) 20 tablet 0  . iodine-sodium iodide 2-2.4 % solution  Apply topically once.     . Melatonin 3 MG TABS Take 3 mg by mouth at bedtime.     . rizatriptan (MAXALT) 10 MG tablet TAKE 1 TABLET BY MOUTH AS NEEDED FOR MIGRAINE *MAY  REPEAT  IN  2  HOURS  IF  NEEDED* 12 tablet 1  . UNABLE TO FIND Med Name: magnesium spray, she makes herself from magnesium salts and water.  She sprays it on topically    . zinc gluconate 50 MG tablet Take 50 mg by mouth daily.    Marland Kitchen zolpidem (AMBIEN) 5 MG tablet Take 1 tablet (5 mg total) by mouth at bedtime as needed for sleep. (Patient not taking: Reported on 03/11/2019) 30 tablet 0   No current facility-administered medications for this visit.     PHYSICAL EXAMINATION: ECOG PERFORMANCE STATUS: 1 - Symptomatic but completely ambulatory  Vitals:   04/07/19 1001  BP: 108/65  Pulse: 68  Resp: 18  Temp: 98 F (36.7 C)  SpO2: 100%   Filed Weights   04/07/19 1001  Weight: 113 lb 6.4 oz (51.4 kg)    GENERAL: alert, no distress and comfortable SKIN: skin color, texture, turgor are normal, no rashes or significant lesions EYES: normal, Conjunctiva are pink and non-injected, sclera clear OROPHARYNX: no exudate, no erythema and lips, buccal mucosa, and tongue normal  NECK: supple, thyroid normal size, non-tender, without nodularity LYMPH: no palpable lymphadenopathy in the cervical, axillary or inguinal LUNGS: clear to auscultation and percussion with normal breathing effort HEART: regular rate & rhythm and no murmurs and no lower extremity edema ABDOMEN: abdomen soft, non-tender and normal bowel sounds MUSCULOSKELETAL: no cyanosis of digits and no clubbing  NEURO: alert & oriented x 3 with fluent speech, no focal motor/sensory deficits EXTREMITIES: No lower extremity edema  LABORATORY DATA:  I have reviewed the data as listed CMP Latest Ref Rng & Units 12/30/2018 09/26/2017 03/22/2017  Glucose 65 - 99 mg/dL 84 77 97  BUN 8 - 27 mg/dL 7(L) 12 10  Creatinine 0.57 - 1.00 mg/dL 0.66 0.55 0.69  Sodium 134 - 144 mmol/L  140 138 140  Potassium 3.5 - 5.2 mmol/L 3.8 4.3 4.1  Chloride 96 - 106 mmol/L 100 101 105  CO2 20 - 29 mmol/L 24 28 30   Calcium 8.7 - 10.3 mg/dL 9.3 9.2 8.9  Total Protein 6.0 - 8.5 g/dL 6.3 6.6 6.1  Total Bilirubin 0.0 - 1.2 mg/dL 0.2 0.5 0.4  Alkaline Phos 39 - 117 IU/L 71 86 72  AST 0 - 40 IU/L 19 24 28   ALT 0 - 32 IU/L 31 37(H) 47(H)    Lab Results  Component Value Date   WBC 5.9 03/22/2017   HGB 14.4 03/22/2017   HCT 43.3 03/22/2017   MCV 97.8 03/22/2017   PLT 210.0 03/22/2017   NEUTROABS 4.0 03/22/2017    ASSESSMENT & PLAN:  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  ER PR positive HER-2 negative, Ki-67 50%  Adjuvant radiation 03/25/2019- ----------------------------------------------------------------- Treatment plan: Adjuvant antiestrogen therapy with anastrozole 1 mg p.o. daily  Breast cancer surveillance: Both mammograms and breast MRIs because only breast MRI picked up her cancer. Next mammogram June 2021 Breast MRI November 2020  Return to clinic in 2 months for survivorship care plan visit.  After that I can see her in 6 months and after that once a year.    No orders of the defined types were placed in this encounter.  The patient has a good understanding of the overall plan. she agrees with it. she will call with any problems that may develop before the next visit here.  Nicholas Lose, MD 04/07/2019  Julious Oka Dorshimer am acting as  scribe for Dr. Nicholas Lose.  I have reviewed the above documentation for accuracy and completeness, and I agree with the above.

## 2019-04-07 ENCOUNTER — Ambulatory Visit
Admission: RE | Admit: 2019-04-07 | Discharge: 2019-04-07 | Disposition: A | Discharge status: Home / Self Care | Payer: Medicare Other | Source: Ambulatory Visit | Attending: Radiation Oncology | Admitting: Radiation Oncology

## 2019-04-07 ENCOUNTER — Other Ambulatory Visit: Payer: Self-pay

## 2019-04-07 ENCOUNTER — Inpatient Hospital Stay: Payer: Medicare Other | Attending: Hematology and Oncology | Admitting: Hematology and Oncology

## 2019-04-07 DIAGNOSIS — Z170 Estrogen receptor positive status [ER+]: Secondary | ICD-10-CM

## 2019-04-07 DIAGNOSIS — C50311 Malignant neoplasm of lower-inner quadrant of right female breast: Secondary | ICD-10-CM | POA: Diagnosis not present

## 2019-04-07 DIAGNOSIS — Z510 Encounter for antineoplastic radiation therapy: Secondary | ICD-10-CM | POA: Diagnosis not present

## 2019-04-07 NOTE — Assessment & Plan Note (Signed)
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%, Ki-67 50%, HER-2 negative, 2+ by IHC FISH ratio 1.27, Breast MRI: 2.3 cm right lower quadrant enhancement Left breast biopsy: Fibroadenoma Oncotype score 18: Distant recurrence at 9 years: 5% T2N0 stage IB Preoperative anastrozole for 1 month  Right lumpectomy: 02/04/2019 Kimberly Miles): IDC, 2.5cm, grade 1, clear margins, and 3 lymph nodes negative.  ER PR positive HER-2 negative, Ki-67 50%  Adjuvant radiation 03/25/2019- ----------------------------------------------------------------- Treatment plan: Adjuvant antiestrogen therapy with anastrozole 1 mg p.o. daily  Breast cancer surveillance: Both mammograms and breast MRIs because only breast MRI picked up her cancer. Next mammogram June 2021 Breast MRI November 2021  Return to clinic in 1 year for follow-up

## 2019-04-08 ENCOUNTER — Ambulatory Visit
Admission: RE | Admit: 2019-04-08 | Discharge: 2019-04-08 | Disposition: A | Discharge status: Home / Self Care | Payer: Medicare Other | Source: Ambulatory Visit | Attending: Radiation Oncology | Admitting: Radiation Oncology

## 2019-04-08 ENCOUNTER — Telehealth: Payer: Self-pay | Admitting: Hematology and Oncology

## 2019-04-08 ENCOUNTER — Other Ambulatory Visit: Payer: Self-pay

## 2019-04-08 DIAGNOSIS — Z170 Estrogen receptor positive status [ER+]: Secondary | ICD-10-CM | POA: Diagnosis not present

## 2019-04-08 DIAGNOSIS — C50311 Malignant neoplasm of lower-inner quadrant of right female breast: Secondary | ICD-10-CM | POA: Diagnosis not present

## 2019-04-08 DIAGNOSIS — Z510 Encounter for antineoplastic radiation therapy: Secondary | ICD-10-CM | POA: Diagnosis not present

## 2019-04-08 NOTE — Telephone Encounter (Signed)
I talk with patient regarding schedule  

## 2019-04-09 ENCOUNTER — Other Ambulatory Visit: Payer: Self-pay

## 2019-04-09 ENCOUNTER — Ambulatory Visit
Admission: RE | Admit: 2019-04-09 | Discharge: 2019-04-09 | Disposition: A | Discharge status: Home / Self Care | Payer: Medicare Other | Source: Ambulatory Visit | Attending: Radiation Oncology | Admitting: Radiation Oncology

## 2019-04-09 DIAGNOSIS — C50311 Malignant neoplasm of lower-inner quadrant of right female breast: Secondary | ICD-10-CM | POA: Diagnosis not present

## 2019-04-09 DIAGNOSIS — Z170 Estrogen receptor positive status [ER+]: Secondary | ICD-10-CM | POA: Diagnosis not present

## 2019-04-09 DIAGNOSIS — Z510 Encounter for antineoplastic radiation therapy: Secondary | ICD-10-CM | POA: Diagnosis not present

## 2019-04-10 ENCOUNTER — Other Ambulatory Visit: Payer: Self-pay

## 2019-04-10 ENCOUNTER — Ambulatory Visit
Admission: RE | Admit: 2019-04-10 | Discharge: 2019-04-10 | Disposition: A | Discharge status: Home / Self Care | Payer: Medicare Other | Source: Ambulatory Visit | Attending: Radiation Oncology | Admitting: Radiation Oncology

## 2019-04-10 DIAGNOSIS — C50311 Malignant neoplasm of lower-inner quadrant of right female breast: Secondary | ICD-10-CM | POA: Diagnosis not present

## 2019-04-10 DIAGNOSIS — Z510 Encounter for antineoplastic radiation therapy: Secondary | ICD-10-CM | POA: Diagnosis not present

## 2019-04-10 DIAGNOSIS — Z170 Estrogen receptor positive status [ER+]: Secondary | ICD-10-CM | POA: Diagnosis not present

## 2019-04-11 ENCOUNTER — Ambulatory Visit
Admission: RE | Admit: 2019-04-11 | Discharge: 2019-04-11 | Disposition: A | Discharge status: Home / Self Care | Payer: Medicare Other | Source: Ambulatory Visit | Attending: Radiation Oncology | Admitting: Radiation Oncology

## 2019-04-11 ENCOUNTER — Other Ambulatory Visit: Payer: Self-pay

## 2019-04-11 DIAGNOSIS — C50311 Malignant neoplasm of lower-inner quadrant of right female breast: Secondary | ICD-10-CM | POA: Diagnosis not present

## 2019-04-11 DIAGNOSIS — Z510 Encounter for antineoplastic radiation therapy: Secondary | ICD-10-CM | POA: Diagnosis not present

## 2019-04-11 DIAGNOSIS — Z170 Estrogen receptor positive status [ER+]: Secondary | ICD-10-CM | POA: Diagnosis not present

## 2019-04-14 ENCOUNTER — Encounter: Payer: Self-pay | Admitting: *Deleted

## 2019-04-14 ENCOUNTER — Ambulatory Visit
Admission: RE | Admit: 2019-04-14 | Discharge: 2019-04-14 | Disposition: A | Discharge status: Home / Self Care | Payer: Medicare Other | Source: Ambulatory Visit | Attending: Radiation Oncology | Admitting: Radiation Oncology

## 2019-04-14 ENCOUNTER — Encounter: Payer: Self-pay | Admitting: Radiation Oncology

## 2019-04-14 ENCOUNTER — Other Ambulatory Visit: Payer: Self-pay

## 2019-04-14 DIAGNOSIS — C50311 Malignant neoplasm of lower-inner quadrant of right female breast: Secondary | ICD-10-CM | POA: Diagnosis not present

## 2019-04-14 DIAGNOSIS — Z170 Estrogen receptor positive status [ER+]: Secondary | ICD-10-CM | POA: Diagnosis not present

## 2019-04-14 DIAGNOSIS — Z510 Encounter for antineoplastic radiation therapy: Secondary | ICD-10-CM | POA: Diagnosis not present

## 2019-05-16 ENCOUNTER — Ambulatory Visit: Payer: Self-pay | Admitting: Radiation Oncology

## 2019-05-17 ENCOUNTER — Ambulatory Visit: Payer: Medicare Other | Admitting: Hematology and Oncology

## 2019-05-28 ENCOUNTER — Ambulatory Visit: Payer: Medicare Other | Admitting: Radiation Oncology

## 2019-06-09 ENCOUNTER — Other Ambulatory Visit: Payer: Self-pay | Admitting: Family Medicine

## 2019-06-09 DIAGNOSIS — G43009 Migraine without aura, not intractable, without status migrainosus: Secondary | ICD-10-CM

## 2019-06-26 ENCOUNTER — Encounter: Payer: Self-pay | Admitting: *Deleted

## 2019-06-30 NOTE — Progress Notes (Signed)
  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, estrogen receptor positive ([place]) Staging form: Breast, AJCC 8th Edition - Clinical: Stage IB (cT2, cN0, cM0, G2, ER+, PR+, HER2-) - Signed by Eppie Gibson, MD on 12/24/2018 - Pathologic stage from 02/04/2019: Stage IA (pT2, pN0, cM0, G1, ER+, PR+, HER2-, Oncotype DX score: 18) - Signed by Gardenia Phlegm, NP on 02/19/2019   Intent: Curative  Radiation Treatment Dates: 03/24/2019 through 04/14/2019 Site Technique Total Dose (Gy) Dose per Fx (Gy) Completed Fx Beam Energies  Breast: Breast_Rt 3D 42.56/42.56 2.66 16/16 6X   Narrative: The patient tolerated radiation therapy relatively well.   Plan: The patient will follow-up with radiation oncology in 1 mo or as needed.     Eppie Gibson, MD

## 2019-07-14 NOTE — Progress Notes (Signed)
SURVIVORSHIP VIRTUAL VISIT:  I connected with Kimberly Miles on 07/14/19 at 10:30 AM EST by my chart video and verified that I am speaking with the correct person using two identifiers.  I discussed the limitations, risks, security and privacy concerns of performing an evaluation and management service by telephone and the availability of in person appointments. I also discussed with the patient that there may be a patient responsible charge related to this service. The patient expressed understanding and agreed to proceed.   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, estrogen receptor positive ([place])  12/24/2018 Initial Diagnosis   Palpable lump in the right breast, 2.1 cm, biopsy revealed grade 2 invasive ductal carcinoma that was ER 100%, PR 50%, Ki-67 50%, HER-2 negative, 2+ by IHC FISH ratio 1.27, T2N0 stage IB   12/24/2018 Cancer Staging   Staging form: Breast, AJCC 8th Edition - Clinical: Stage IB (cT2, cN0, cM0, G2, ER+, PR+, HER2-) - Signed by Kimberly Gibson, MD on 12/24/2018   11/2018 - 11/2023 Anti-estrogen oral therapy   Anastrozole 1 mg   02/04/2019 Surgery   Right lumpectomy Kimberly Miles): IDC, 2.5cm, grade 1, clear margins, and 3 lymph nodes negative.   02/04/2019 Cancer Staging   Staging form: Breast, AJCC 8th Edition - Pathologic stage from 02/04/2019: Stage IA (pT2, pN0, cM0, G1, ER+, PR+, HER2-, Oncotype DX score: 18) - Signed by Kimberly Phlegm, NP on 02/19/2019   02/11/2019 Surgery   Bil Mammaplasty: benign   03/24/2019 - 04/14/2019 Radiation Therapy   The patient initially received a dose of 42.56 Gy in 16 fractions to the breast using whole-breast tangent fields. This was delivered using a 3-D conformal technique. The total dose was 42.56 Gy.     Oncotype testing   The Oncotype DX score was 18 predicting a risk of outside the breast recurrence over the next 9 years of 5 % if the patient's only systemic  therapy is tamoxifen for 5 years.      INTERVAL HISTORY:  Kimberly Miles to review her survivorship care plan detailing her treatment course for breast cancer, as well as monitoring long-term side effects of that treatment, education regarding health maintenance, screening, and overall wellness and health promotion.     Overall, Kimberly Miles reports feeling quite well   REVIEW OF SYSTEMS:  Review of Systems  Constitutional: Negative for appetite change, chills, fatigue, fever and unexpected weight change.  HENT:   Negative for hearing loss, lump/mass, sore throat and trouble swallowing.   Eyes: Negative for eye problems and icterus.  Respiratory: Negative for chest tightness, cough and shortness of breath.   Cardiovascular: Negative for chest pain, leg swelling and palpitations.  Gastrointestinal: Negative for abdominal distention, abdominal pain, constipation, diarrhea, nausea and vomiting.  Endocrine: Negative for hot flashes.  Genitourinary: Negative for difficulty urinating.   Musculoskeletal: Negative for arthralgias.  Skin: Negative for itching.  Neurological: Negative for dizziness, extremity weakness, headaches and numbness.  Hematological: Negative for adenopathy. Does not bruise/bleed easily.  Psychiatric/Behavioral: Negative for depression. The patient is not nervous/anxious.   Breast: Denies any new nodularity, masses, tenderness, nipple changes, or nipple discharge.      ONCOLOGY TREATMENT TEAM:  1. Surgeon:  Dr. Dalbert Miles at [facility] 2. Medical Oncologist: Dr. Lindi Miles  3. Radiation Oncologist: Dr. Isidore Miles    PAST MEDICAL/SURGICAL HISTORY:  Past Medical History:  Diagnosis Date  . Anxiety   . Arthritis   . Cancer ([place]) 11/2018  right breast cancer  . Common migraine with intractable migraine 04/06/2016  . Complication of anesthesia   . Diverticulosis   . Elevated LFTs    monitored by pcp per pt --  unknown etiology  . Family history of premature CAD  09/01/2013  . History of basal cell carcinoma excision    2004   &  2014  . History of colitis   . History of palpitations   . 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 IBS (irritable bowel syndrome)   . Migraine   . MTHFR gene mutation ([place])   . PMB (postmenopausal bleeding)   . PONV (postoperative nausea and vomiting)   . Post-surgical hypothyroidism    Past Surgical History:  Procedure Laterality Date  . APPENDECTOMY  as child  . BREAST BIOPSY Left 1979   benign  . BREAST EXCISIONAL BIOPSY Left   . BREAST LUMPECTOMY WITH RADIOACTIVE SEED AND SENTINEL LYMPH NODE BIOPSY Right 02/04/2019   Procedure: RIGHT BREAST LUMPECTOMY WITH RADIOACTIVE SEED AND RIGHT AXILLARY DEEP SENTINEL LYMPH NODE BIOPSY WITH BLUE DYE INJECTION;  Surgeon: Kimberly Skates, MD;  Location: [facility];  Service: General;  Laterality: Right;  . BREAST REDUCTION SURGERY Bilateral 02/11/2019   Procedure: RIGHT BREAST ONCOPLASTIC RECONSTRUCTION, LEFT BREAST REDUCTION;  Surgeon: Kimberly Limbo, MD;  Location: [facility];  Service: Plastics;  Laterality: Bilateral;  . CARDIOVASCULAR STRESS TEST  09-16-2013   normal nuclear study w/ no ischemia/  normal LV function and wall motion , ef 84%  . CESAREAN SECTION  1977 and 1978   Bilateral Tubal Ligation w/ last c/s  . COLPOSCOPY  05/2009   CIN 1  . HYSTEROSCOPY WITH D & C N/A 12/23/2015   Procedure: DILATATION AND CURETTAGE /HYSTEROSCOPY ;  Surgeon: Kimberly Salon, MD;  Location: [facility];  Service: Gynecology;  Laterality: N/A;  . NEGATIVE SLEEP STUDY  2014  per pt  . OOPHORECTOMY Right 1985   ruptured cyst   . THYROID LOBECTOMY Right 03-08-2011  . TRANSTHORACIC ECHOCARDIOGRAM  05-09-2011   ef 64%/  mild MR and TR  . TUBAL LIGATION       ALLERGIES:  Allergies  Allergen Reactions  . Codeine Nausea Only  . Demerol [Meperidine] Nausea And  Vomiting  . Erythromycin Other (See Comments)    Stomach upset  . Sulfa Antibiotics Other (See Comments)    unknown reaction  . Ampicillin Rash    She reports she also "saw stars"  . Chlorhexidine Rash     CURRENT MEDICATIONS:  Outpatient Encounter Medications as of 07/15/2019  Medication Sig Note  . anastrozole (ARIMIDEX) 1 MG tablet Take 1 tablet (1 mg total) by mouth daily.   . Ascorbic Acid (VITAMIN C) 1000 MG tablet Take 1,000 mg by mouth daily. 2 tablet daily   . butalbital-aspirin-caffeine (FIORINAL) 50-325-40 MG capsule Take 1 capsule by mouth every 6 (six) hours as needed for headache.   . Cholecalciferol (VITAMIN D3) 5000 UNITS CAPS Take 1 capsule by mouth daily.   . cyclobenzaprine (FLEXERIL) 5 MG tablet Take 1 tablet by mouth daily as needed.  07/03/2014: Received Sig:   . EUTHYROX 75 MCG tablet TAKE 1 TABLET BY MOUTH ONCE DAILY BEFORE BREAKFAST   . iodine-sodium iodide 2-2.4 % solution Apply topically once.    . Melatonin 3 MG TABS Take 3 mg by mouth at bedtime.    Marland Kitchen  rizatriptan (MAXALT) 10 MG tablet TAKE 1 TABLET BY MOUTH AS NEEDED FOR MIGRAINE, MAY REPEAT IN 2 HOURS IF NEEDED   . UNABLE TO FIND Med Name: magnesium spray, she makes herself from magnesium salts and water.  She sprays it on topically   . zinc gluconate 50 MG tablet Take 50 mg by mouth daily.   Marland Kitchen zolpidem (AMBIEN) 5 MG tablet Take 1 tablet (5 mg total) by mouth at bedtime as needed for sleep. (Patient not taking: Reported on 03/11/2019)    No facility-administered encounter medications on file as of 07/15/2019.     ONCOLOGIC FAMILY HISTORY:  Family History  Problem Relation Age of Onset  . Hypertension Maternal Grandmother   . Uterine cancer Maternal Grandmother   . Vascular Disease Maternal Grandmother   . Lung cancer Maternal Grandmother   . Stroke Maternal Grandfather   . Prostate cancer Paternal Grandfather   . Migraines Paternal Grandmother   . Lung cancer Mother 28  . Hypertension Mother 28  .  Hyperlipidemia Mother 38  . Ehlers-Danlos syndrome Mother   . Kidney disease Mother   . Fibromyalgia Mother   . Other Father        bypass surgery times 2  . Migraines Father   . Osteoporosis Father      GENETIC COUNSELING/TESTING: Not at this time; referral placed  SOCIAL HISTORY:  Social History   Socioeconomic History  . Marital status: Married    Spouse name: Not on file  . Number of children: 2  . Years of education: 56  . Highest education level: Not on file  Occupational History  . Occupation: Retired  Tobacco Use  . Smoking status: Never Smoker  . Smokeless tobacco: Never Used  Substance and Sexual Activity  . Alcohol use: Yes    Alcohol/week: 0.0 standard drinks    Comment: rare  . Drug use: No  . Sexual activity: Yes    Partners: Male    Birth control/protection: Post-menopausal    Comment: BTL done w/ last C/S  Other Topics Concern  . Not on file  Social History Narrative   Lives at home w/ her husband   Right-handed   Caffeine: 1/4 cup per day   Social Determinants of Health   Financial Resource Strain:   . Difficulty of Paying Living Expenses: Not on file  Food Insecurity:   . Worried About Charity fundraiser in the Last Year: Not on file  . Ran Out of Food in the Last Year: Not on file  Transportation Needs: No Transportation Needs  . Lack of Transportation (Medical): No  . Lack of Transportation (Non-Medical): No  Physical Activity:   . Days of Exercise per Week: Not on file  . Minutes of Exercise per Session: Not on file  Stress:   . Feeling of Stress : Not on file  Social Connections:   . Frequency of Communication with Friends and Family: Not on file  . Frequency of Social Gatherings with Friends and Family: Not on file  . Attends Religious Services: Not on file  . Active Member of Clubs or Organizations: Not on file  . Attends Archivist Meetings: Not on file  . Marital Status: Not on file  Intimate Partner Violence: Not  At Risk  . Fear of Current or Ex-Partner: No  . Emotionally Abused: No  . Physically Abused: No  . Sexually Abused: No     OBSERVATIONS/OBJECTIVE:  Patient appears well.  She is in no  apparent distress.  Breathing is non labored.  Mood and behavior are normal.  Skin visualized without rash or lesion.    LABORATORY DATA:  None for this visit.  DIAGNOSTIC IMAGING:  None for this visit.      ASSESSMENT AND PLAN:  Kimberly Miles is a pleasant 64 y.o. female with Stage IA right breast invasive ductal carcinoma, ER+/PR+/HER2-, diagnosed in 11/2018, treated with lumpectomy, adjuvant radiation therapy, and anti-estrogen therapy with Anastrozole beginning in 11/2018.  She presents to the Survivorship [facility] for our initial meeting and routine follow-up post-completion of treatment for breast cancer.    1. Stage IA right breast cancer:  Kimberly Miles is continuing to recover from definitive treatment for breast cancer. She will follow-up with her medical oncologist, Dr. Lindi Miles in 11/2019 with history and physical exam per surveillance protocol.  She will continue her anti-estrogen therapy with Anastrozole. Thus far, she is tolerating the Anastrozole well, with minimal side effects. She was instructed to make Dr. Lindi Miles or myself aware if she begins to experience any worsening side effects of the medication and I could see her back in clinic to help manage those side effects, as needed.   She was recommended to undergo breast MRI by Dr. Lindi Miles, as mammogram did not pick up her initial breast cancer.  This is due in 05/2020.  Her mammogram is due 11/2019; orders placed today.  Her breast density is category B. Today, a comprehensive survivorship care plan and treatment summary was reviewed with the patient today detailing her breast cancer diagnosis, treatment course, potential late/long-term effects of treatment, appropriate follow-up care with recommendations for the future, and patient education resources.  A  copy of this summary, along with a letter will be sent to the patient's primary care provider via mail/fax/In Basket message after today's visit.    2. Genetics referral: Kimberly Miles would like to learn more about genetic testing, the likelihood of an abnormality for her, and what her options are for paying out of pocket for testing.  I have placed a referral to our genetic counselors today.    3. Bone health:  Given Kimberly Miles's age/history of breast cancer and her current treatment regimen including anti-estrogen therapy with Anastrozole, she is at risk for bone demineralization.  Her last DEXA scan was 11/16/2017, which showed osteoporois with a T score of -3.0 in the right femoral neck.  She is due for repeat in 10/2019, and orders for this have been placed, she will f/u with Dr. Lindi Miles shortly thereafter to discuss further, as she may likely benefit from bisphosphanate therapy.  We discussed calcium, vitamin d and weight bearing exercises in detail today.  In the meantime, she was encouraged to increase her consumption of foods rich in calcium, as well as increase her weight-bearing activities.  She was given education on specific activities to promote bone health.  4. Cancer screening:  Due to Kimberly Miles history and her age, she should receive screening for skin cancers, colon cancer, and gynecologic cancers.  The information and recommendations are listed on the patient's comprehensive care plan/treatment summary and were reviewed in detail with the patient.    5. Health maintenance and wellness promotion: Kimberly Miles was encouraged to consume 5-7 servings of fruits and vegetables per day. We reviewed the "Nutrition Rainbow" handout, as well as the handout "Take Control of Your Health and Reduce Your Cancer Risk" from the [place].  She was also encouraged to engage in moderate to vigorous exercise for 30  minutes per day most days of the week. We discussed the [employer] fitness program,  which is designed for cancer survivors to help them become more physically fit after cancer treatments.  She was instructed to limit her alcohol consumption and continue to abstain from tobacco use.     6. Support services/counseling: It is not uncommon for this period of the patient's cancer care trajectory to be one of many emotions and stressors.  We discussed how this can be increasingly difficult during the times of quarantine and social distancing due to the COVID-19 pandemic.   She was given information regarding our available services and encouraged to contact me with any questions or for help enrolling in any of our support group/programs.    Follow up instructions:    -Return to cancer center for f/u with Dr. Lindi Miles in 6 months  -Mammogram due in 11/2019 -Breast MRI 05/2020 -Follow up with oncology APP in 12 months -She was recommended to continue with the appropriate pandemic precautions.  -She knows to call for any questions that may arise between now and her next appointment.  We are happy to see her sooner if needed.   Total time spent this encounter: 60 minutes.     Scot Dock, NP

## 2019-07-15 ENCOUNTER — Telehealth: Payer: Self-pay | Admitting: Hematology and Oncology

## 2019-07-15 ENCOUNTER — Encounter: Payer: Self-pay | Admitting: Adult Health

## 2019-07-15 ENCOUNTER — Inpatient Hospital Stay: Payer: Medicare Other | Attending: Adult Health | Admitting: Adult Health

## 2019-07-15 DIAGNOSIS — E2839 Other primary ovarian failure: Secondary | ICD-10-CM

## 2019-07-15 DIAGNOSIS — C50311 Malignant neoplasm of lower-inner quadrant of right female breast: Secondary | ICD-10-CM

## 2019-07-15 DIAGNOSIS — Z170 Estrogen receptor positive status [ER+]: Secondary | ICD-10-CM

## 2019-07-15 NOTE — Telephone Encounter (Signed)
I talk with patient regarding schedule  

## 2019-07-21 ENCOUNTER — Encounter: Payer: Self-pay | Admitting: Genetic Counselor

## 2019-07-21 ENCOUNTER — Inpatient Hospital Stay (HOSPITAL_BASED_OUTPATIENT_CLINIC_OR_DEPARTMENT_OTHER): Payer: Medicare Other | Admitting: Genetic Counselor

## 2019-07-21 ENCOUNTER — Other Ambulatory Visit: Payer: Medicare Other

## 2019-07-21 DIAGNOSIS — Z170 Estrogen receptor positive status [ER+]: Secondary | ICD-10-CM | POA: Diagnosis not present

## 2019-07-21 DIAGNOSIS — Z8042 Family history of malignant neoplasm of prostate: Secondary | ICD-10-CM | POA: Diagnosis not present

## 2019-07-21 DIAGNOSIS — Z8049 Family history of malignant neoplasm of other genital organs: Secondary | ICD-10-CM | POA: Diagnosis not present

## 2019-07-21 DIAGNOSIS — C50311 Malignant neoplasm of lower-inner quadrant of right female breast: Secondary | ICD-10-CM | POA: Diagnosis not present

## 2019-07-21 NOTE — Progress Notes (Addendum)
REFERRING PROVIDER: Gardenia Phlegm, NP [place],  [place] [address]  PRIMARY PROVIDER:  Carollee Herter, Alferd Apa, DO  PRIMARY REASON FOR VISIT:  1. 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 Family history of prostate cancer   3. Family history of uterine cancer      HISTORY OF PRESENT ILLNESS:  I connected with  Ms. Heffernan on 07/21/2019 at 10 AM EDT by [place] video conference and verified that I am speaking with the correct person using two identifiers.   Patient location: home Provider location: [facility]   Ms. Smarr, a 64 y.o. female, was seen for a [facility] cancer genetics consultation at the request of Wilber Bihari, NP due to a personal and family history of cancer.  Ms. Milkovich presents to clinic today to discuss the possibility of a hereditary predisposition to cancer, genetic testing, and to further clarify her future cancer risks, as well as potential cancer risks for family members.   In the early 2000's, at the age of 46, Ms. Shellhammer was diagnosed with basal cell carcinoma of the face, hand and leg. The treatment plan was just removed.  In 2020 at the age of 42, Ms. Schuelke was diagnosed with cancer of the right breast.  This was treated with a lumpectomy and radiation.    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, estrogen receptor positive ([place])  12/24/2018 Initial Diagnosis   Palpable lump in the right breast, 2.1 cm, biopsy revealed grade 2 invasive ductal carcinoma that was ER 100%, PR 50%, Ki-67 50%, HER-2 negative, 2+ by IHC FISH ratio 1.27, T2N0 stage IB   12/24/2018 Cancer Staging   Staging form: Breast, AJCC 8th Edition - Clinical: Stage IB (cT2, cN0, cM0, G2, ER+, PR+, HER2-) - Signed by Eppie Gibson, MD on 12/24/2018   11/2018 - 11/2023 Anti-estrogen oral therapy   Anastrozole 1 mg   02/04/2019 Surgery   Right lumpectomy  Dalbert Batman): IDC, 2.5cm, grade 1, clear margins, and 3 lymph nodes negative.   02/04/2019 Cancer Staging   Staging form: Breast, AJCC 8th Edition - Pathologic stage from 02/04/2019: Stage IA (pT2, pN0, cM0, G1, ER+, PR+, HER2-, Oncotype DX score: 18) - Signed by Gardenia Phlegm, NP on 02/19/2019   02/11/2019 Surgery   Bil Mammaplasty: benign   03/24/2019 - 04/14/2019 Radiation Therapy   The patient initially received a dose of 42.56 Gy in 16 fractions to the breast using whole-breast tangent fields. This was delivered using a 3-D conformal technique. The total dose was 42.56 Gy.     Oncotype testing   The Oncotype DX score was 18 predicting a risk of outside the breast recurrence over the next 9 years of 5 % if the patient's only systemic therapy is tamoxifen for 5 years.       RISK FACTORS:  Menarche was at age 54.  First live birth at age 30.  Ovaries intact: right was removed at age 28.  Hysterectomy: no.  Menopausal status: postmenopausal.  HRT use: 1 years. Colonoscopy: yes; normal. Mammogram within the last year: yes. Number of breast biopsies: 2. Up to date with pelvic exams: yes. Any excessive radiation exposure in the past: no  Past Medical History:  Diagnosis Date  . Anxiety   . Arthritis   . Cancer ([facility]) 11/2018   right breast cancer  . Common migraine with intractable migraine 04/06/2016  .  Complication of anesthesia   . Diverticulosis   . Elevated LFTs    monitored by pcp per pt --  unknown etiology  . Family history of premature CAD 09/01/2013  . Family history of prostate cancer   . Family history of uterine cancer   . History of basal cell carcinoma excision    2004   &  2014  . History of colitis   . History of palpitations   . 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 IBS (irritable bowel syndrome)   . Migraine   . MTHFR gene mutation ([facility])   . PMB (postmenopausal  bleeding)   . PONV (postoperative nausea and vomiting)   . Post-surgical hypothyroidism     Past Surgical History:  Procedure Laterality Date  . APPENDECTOMY  as child  . BREAST BIOPSY Left 1979   benign  . BREAST EXCISIONAL BIOPSY Left   . BREAST LUMPECTOMY WITH RADIOACTIVE SEED AND SENTINEL LYMPH NODE BIOPSY Right 02/04/2019   Procedure: RIGHT BREAST LUMPECTOMY WITH RADIOACTIVE SEED AND RIGHT AXILLARY DEEP SENTINEL LYMPH NODE BIOPSY WITH BLUE DYE INJECTION;  Surgeon: Fanny Skates, MD;  Location: [facility];  Service: General;  Laterality: Right;  . BREAST REDUCTION SURGERY Bilateral 02/11/2019   Procedure: RIGHT BREAST ONCOPLASTIC RECONSTRUCTION, LEFT BREAST REDUCTION;  Surgeon: Irene Limbo, MD;  Location: [facility];  Service: Plastics;  Laterality: Bilateral;  . CARDIOVASCULAR STRESS TEST  09-16-2013   normal nuclear study w/ no ischemia/  normal LV function and wall motion , ef 84%  . CESAREAN SECTION  1977 and 1978   Bilateral Tubal Ligation w/ last c/s  . COLPOSCOPY  05/2009   CIN 1  . HYSTEROSCOPY WITH D & C N/A 12/23/2015   Procedure: DILATATION AND CURETTAGE /HYSTEROSCOPY ;  Surgeon: Megan Salon, MD;  Location: [facility];  Service: Gynecology;  Laterality: N/A;  . NEGATIVE SLEEP STUDY  2014  per pt  . OOPHORECTOMY Right 1985   ruptured cyst   . THYROID LOBECTOMY Right 03-08-2011  . TRANSTHORACIC ECHOCARDIOGRAM  05-09-2011   ef 64%/  mild MR and TR  . TUBAL LIGATION      Social History   Socioeconomic History  . Marital status: Married    Spouse name: Not on file  . Number of children: 2  . Years of education: 33  . Highest education level: Not on file  Occupational History  . Occupation: Retired  Tobacco Use  . Smoking status: Never Smoker  . Smokeless tobacco: Never Used  Substance and Sexual Activity  . Alcohol use: Yes    Alcohol/week: 0.0 standard drinks    Comment: rare  . Drug use: No  . Sexual  activity: Yes    Partners: Male    Birth control/protection: Post-menopausal    Comment: BTL done w/ last C/S  Other Topics Concern  . Not on file  Social History Narrative   Lives at home w/ her husband   Right-handed   Caffeine: 1/4 cup per day   Social Determinants of Health   Financial Resource Strain:   . Difficulty of Paying Living Expenses: Not on file  Food Insecurity:   . Worried About Charity fundraiser in the Last Year: Not on file  . Ran Out of Food in the Last Year: Not on file  Transportation Needs: No Transportation Needs  . Lack of  Transportation (Medical): No  . Lack of Transportation (Non-Medical): No  Physical Activity:   . Days of Exercise per Week: Not on file  . Minutes of Exercise per Session: Not on file  Stress:   . Feeling of Stress : Not on file  Social Connections:   . Frequency of Communication with Friends and Family: Not on file  . Frequency of Social Gatherings with Friends and Family: Not on file  . Attends Religious Services: Not on file  . Active Member of Clubs or Organizations: Not on file  . Attends Archivist Meetings: Not on file  . Marital Status: Not on file     FAMILY HISTORY:  We obtained a detailed, 4-generation family history.  Significant diagnoses are listed below: Family History  Problem Relation Age of Onset  . Hypertension Maternal Grandmother   . Uterine cancer Maternal Grandmother 56  . Vascular Disease Maternal Grandmother   . Lung cancer Maternal Grandmother 9  . Stroke Maternal Grandfather   . Prostate cancer Paternal Grandfather 45  . Migraines Paternal Grandmother   . Lung cancer Mother 34  . Hypertension Mother 2  . Hyperlipidemia Mother 17  . Ehlers-Danlos syndrome Mother   . Kidney disease Mother   . Fibromyalgia Mother   . Other Father        bypass surgery times 2  . Migraines Father   . Osteoporosis Father   . Kidney disease Maternal Aunt     The patient has a son and daughter who  are cancer free.  She has one brother who is cancer free.  Both parents are living.  The patient's mother had lung cancer, most likely due to smoking, at age 60.  She had one sister who died of kidney disease.  The maternal grandmother had uterine cancer at 9 and died at 87.    The patient's father is living.  He had two sisters and two brothers, none reported with cancer.  The paternal grandfather died of prostate cancer.  Ms. Kamiya is unaware of previous family history of genetic testing for hereditary cancer risks. Patient's maternal ancestors are of [place] descent, and paternal ancestors are of [place], [place] and [place] descent. There is no reported Ashkenazi Jewish ancestry. There is no known consanguinity.    GENETIC COUNSELING ASSESSMENT: Ms. Asa is a 64 y.o. female with a personal and family history of cancer which is somewhat suggestive of a hereditary cancer syndrome and predisposition to cancer given the combination of breast and prostate cancer. We, therefore, discussed and recommended the following at today's visit.   DISCUSSION: We discussed that 5 - 10% of breast cancer is hereditary, with most cases associated with BRCA mutations.  There are other genes that can be associated with hereditary breast cancer syndromes.  These include ATM, CHEK2 and PALB2.  We discussed that testing is beneficial for several reasons including knowing how to follow individuals after completing their treatment, identifying whether potential treatment options such as PARP inhibitors would be beneficial, and understand if other family members could be at risk for cancer and allow them to undergo genetic testing.   We reviewed the characteristics, features and inheritance patterns of hereditary cancer syndromes. We also discussed genetic testing, including the appropriate family members to test, the process of testing, insurance coverage and turn-around-time for  results. We discussed the implications of a negative, positive, carrier and/or variant of uncertain significant result. We recommended Ms. Sidney pursue genetic testing for  the common hereditary cancer gene panel. The Common Hereditary Gene Panel offered by [facility] includes sequencing and/or deletion duplication testing of the following 48 genes: APC, ATM, AXIN2, BARD1, BMPR1A, BRCA1, BRCA2, BRIP1, CDH1, CDK4, CDKN2A (p14ARF), CDKN2A (p16INK4a), CHEK2, CTNNA1, DICER1, EPCAM (Deletion/duplication testing only), GREM1 (promoter region deletion/duplication testing only), KIT, MEN1, MLH1, MSH2, MSH3, MSH6, MUTYH, NBN, NF1, NHTL1, PALB2, PDGFRA, PMS2, POLD1, POLE, PTEN, RAD50, RAD51C, RAD51D, RNF43, SDHB, SDHC, SDHD, SMAD4, SMARCA4. STK11, TP53, TSC1, TSC2, and VHL.  The following genes were evaluated for sequence changes only: SDHA and HOXB13 c.251G>A variant only.   Based on Ms. Underwood's personal and family history of cancer, she meets medical criteria for genetic testing. Despite that she meets criteria, she may still have an out of pocket cost. We discussed that if her out of pocket cost for testing is over $100, the laboratory will call and confirm whether she wants to proceed with testing.  If the out of pocket cost of testing is less than $100 she will be billed by the genetic testing laboratory.   PLAN: After considering the risks, benefits, and limitations, Ms. Choe provided informed consent to pursue genetic testing.  A saliva kit will be sent to her home and she will be responsible for completing the kit and mailing it back to [facility] for analysis of the common hereditary cancer panel. Results should be available within approximately 2-3 weeks' time, at which point they will be disclosed by telephone to Ms. Rounds, as will any additional recommendations warranted by these results. Ms. Batton will receive a summary of her genetic counseling visit and a copy of her results once available. This  information will also be available in Epic.   Lastly, we encouraged Ms. Boakye to remain in contact with cancer genetics annually so that we can continuously update the family history and inform her of any changes in cancer genetics and testing that may be of benefit for this family.   Ms. Donald questions were answered to her satisfaction today. Our contact information was provided should additional questions or concerns arise. Thank you for the referral and allowing [place] to share in the care of your patient.   Araiyah Cumpton P. Florene Glen, [place], [facility] Licensed, Insurance risk surveyor [email or web address] phone: [phone]  The patient was seen for a total of 30 minutes in face-to-face genetic counseling.  This patient was discussed with Drs. Magrinat, Lindi Adie and/or Burr Medico who agrees with the above.    _______________________________________________________________________ For Office Staff:  Number of people involved in session: 1 Was an Intern/ student involved with case: no

## 2019-07-24 DIAGNOSIS — Z8042 Family history of malignant neoplasm of prostate: Secondary | ICD-10-CM | POA: Diagnosis not present

## 2019-07-24 DIAGNOSIS — C50311 Malignant neoplasm of lower-inner quadrant of right female breast: Secondary | ICD-10-CM | POA: Diagnosis not present

## 2019-07-31 DIAGNOSIS — I788 Other diseases of capillaries: Secondary | ICD-10-CM | POA: Diagnosis not present

## 2019-07-31 DIAGNOSIS — D2261 Melanocytic nevi of right upper limb, including shoulder: Secondary | ICD-10-CM | POA: Diagnosis not present

## 2019-07-31 DIAGNOSIS — L820 Inflamed seborrheic keratosis: Secondary | ICD-10-CM | POA: Diagnosis not present

## 2019-07-31 DIAGNOSIS — D2262 Melanocytic nevi of left upper limb, including shoulder: Secondary | ICD-10-CM | POA: Diagnosis not present

## 2019-07-31 DIAGNOSIS — D225 Melanocytic nevi of trunk: Secondary | ICD-10-CM | POA: Diagnosis not present

## 2019-07-31 DIAGNOSIS — D485 Neoplasm of uncertain behavior of skin: Secondary | ICD-10-CM | POA: Diagnosis not present

## 2019-07-31 DIAGNOSIS — L720 Epidermal cyst: Secondary | ICD-10-CM | POA: Diagnosis not present

## 2019-07-31 DIAGNOSIS — Z85828 Personal history of other malignant neoplasm of skin: Secondary | ICD-10-CM | POA: Diagnosis not present

## 2019-07-31 DIAGNOSIS — D2272 Melanocytic nevi of left lower limb, including hip: Secondary | ICD-10-CM | POA: Diagnosis not present

## 2019-07-31 DIAGNOSIS — L918 Other hypertrophic disorders of the skin: Secondary | ICD-10-CM | POA: Diagnosis not present

## 2019-07-31 DIAGNOSIS — D2271 Melanocytic nevi of right lower limb, including hip: Secondary | ICD-10-CM | POA: Diagnosis not present

## 2019-07-31 DIAGNOSIS — L814 Other melanin hyperpigmentation: Secondary | ICD-10-CM | POA: Diagnosis not present

## 2019-08-07 ENCOUNTER — Encounter: Payer: Self-pay | Admitting: Genetic Counselor

## 2019-08-07 ENCOUNTER — Ambulatory Visit: Payer: Self-pay | Admitting: Genetic Counselor

## 2019-08-07 ENCOUNTER — Telehealth: Payer: Self-pay | Admitting: Genetic Counselor

## 2019-08-07 DIAGNOSIS — Z170 Estrogen receptor positive status [ER+]: Secondary | ICD-10-CM

## 2019-08-07 DIAGNOSIS — Z1379 Encounter for other screening for genetic and chromosomal anomalies: Secondary | ICD-10-CM

## 2019-08-07 DIAGNOSIS — C50311 Malignant neoplasm of lower-inner quadrant of right female breast: Secondary | ICD-10-CM

## 2019-08-07 NOTE — Telephone Encounter (Signed)
Revealed negative genetic testing.  Discussed that we do not know why she has breast cancer or why there is cancer in the family. It could be due to a different gene that we are not testing, or maybe our current technology may not be able to pick something up.  It will be important for her to keep in contact with genetics to keep up with whether additional testing may be needed. 

## 2019-08-07 NOTE — Progress Notes (Signed)
HPI:  Kimberly Miles was previously seen in the [facility] clinic due to a personal and family history of cancer and concerns regarding a hereditary predisposition to cancer. Please refer to our prior cancer genetics clinic note for more information regarding our discussion, assessment and recommendations, at the time. Kimberly Miles recent genetic test results were disclosed to her, as were recommendations warranted by these results. These results and recommendations are discussed in more detail below.  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, estrogen receptor positive ([place])  12/24/2018 Initial Diagnosis   Palpable lump in the right breast, 2.1 cm, 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   02/04/2019 Cancer Staging   Staging form: Breast, AJCC 8th Edition - Pathologic stage from 02/04/2019: Stage IA (pT2, pN0, cM0, G1, ER+, PR+, HER2-, Oncotype DX score: 18) - Signed by Gardenia Phlegm, NP on 02/19/2019   02/11/2019 Surgery   Bil Mammaplasty: benign   03/24/2019 - 04/14/2019 Radiation Therapy   The patient initially received a dose of 42.56 Gy in 16 fractions to the breast using whole-breast tangent fields. This was delivered using a 3-D conformal technique. The total dose was 42.56 Gy.     Oncotype testing   The Oncotype DX score was 18 predicting a risk of outside the breast recurrence over the next 9 years of 5 % if the patient's only systemic therapy is tamoxifen for 5  years.    08/06/2019 Genetic Testing   Negative genetic testing on the common hereditary cancer panel.  The Common Hereditary Gene Panel offered by [facility] includes sequencing and/or deletion duplication testing of the following 48 genes: APC, ATM, AXIN2, BARD1, BMPR1A, BRCA1, BRCA2, BRIP1, CDH1, CDK4, CDKN2A (p14ARF), CDKN2A (p16INK4a), CHEK2, CTNNA1, DICER1, EPCAM (Deletion/duplication testing only), GREM1 (promoter region deletion/duplication testing only), KIT, MEN1, MLH1, MSH2, MSH3, MSH6, MUTYH, NBN, NF1, NHTL1, PALB2, PDGFRA, PMS2, POLD1, POLE, PTEN, RAD50, RAD51C, RAD51D, RNF43, SDHB, SDHC, SDHD, SMAD4, SMARCA4. STK11, TP53, TSC1, TSC2, and VHL.  The following genes were evaluated for sequence changes only: SDHA and HOXB13 c.251G>A variant only. The report date is August 06, 2019.     FAMILY HISTORY:  We obtained a detailed, 4-generation family history.  Significant diagnoses are listed below: Family History  Problem Relation Age of Onset  . Hypertension Maternal Grandmother   . Uterine cancer Maternal Grandmother 31  . Vascular Disease Maternal Grandmother   . Lung cancer Maternal Grandmother 61  . Stroke Maternal Grandfather   . Prostate cancer Paternal Grandfather 102  . Migraines Paternal Grandmother   . Lung cancer Mother 3  . Hypertension Mother 79  . Hyperlipidemia Mother 15  . Ehlers-Danlos syndrome Mother   . Kidney disease Mother   . Fibromyalgia Mother   . Other Father        bypass surgery times 2  . Migraines Father   . Osteoporosis Father   . Kidney disease Maternal Aunt     The patient has  a son and daughter who are cancer free.  She has one brother who is cancer free.  Both parents are living.  The patient's mother had lung cancer, most likely due to smoking, at age 66.  She had one sister who died of kidney disease.  The maternal grandmother had uterine cancer at 51 and died at 79.    The patient's father is living.  He had two sisters and two brothers,  none reported with cancer.  The paternal grandfather died of prostate cancer.  Kimberly Miles is unaware of previous family history of genetic testing for hereditary cancer risks. Patient's maternal ancestors are of [place] descent, and paternal ancestors are of [place], [place] and [place] descent. There is no reported Ashkenazi Jewish ancestry. There is no known consanguinity.    GENETIC TEST RESULTS: Genetic testing reported out on August 06, 2019 through the common hereditary cancer panel found no pathogenic mutations. The Common Hereditary Gene Panel offered by [facility] includes sequencing and/or deletion duplication testing of the following 48 genes: APC, ATM, AXIN2, BARD1, BMPR1A, BRCA1, BRCA2, BRIP1, CDH1, CDK4, CDKN2A (p14ARF), CDKN2A (p16INK4a), CHEK2, CTNNA1, DICER1, EPCAM (Deletion/duplication testing only), GREM1 (promoter region deletion/duplication testing only), KIT, MEN1, MLH1, MSH2, MSH3, MSH6, MUTYH, NBN, NF1, NHTL1, PALB2, PDGFRA, PMS2, POLD1, POLE, PTEN, RAD50, RAD51C, RAD51D, RNF43, SDHB, SDHC, SDHD, SMAD4, SMARCA4. STK11, TP53, TSC1, TSC2, and VHL.  The following genes were evaluated for sequence changes only: SDHA and HOXB13 c.251G>A variant only. The test report has been scanned into EPIC and is located under the Molecular Pathology section of the Results Review tab.  A portion of the result report is included below for reference.     We discussed with Kimberly Miles that because current genetic testing is not perfect, it is possible there may be a gene mutation in one of these genes that current testing cannot detect, but that chance is small.  We also discussed, that there could be another gene that has not yet been discovered, or that we have not yet tested, that is responsible for the cancer diagnoses in the family. It is also possible there is a hereditary cause for the cancer in the family that Kimberly Miles did not inherit and therefore was not  identified in her testing.  Therefore, it is important to remain in touch with cancer genetics in the future so that we can continue to offer Kimberly Miles the most up to date genetic testing.   ADDITIONAL GENETIC TESTING: We discussed with Kimberly Miles that there are other genes that are associated with increased cancer risk that can be analyzed. Should Kimberly Miles wish to pursue additional genetic testing, we are happy to discuss and coordinate this testing, at any time.    CANCER SCREENING RECOMMENDATIONS: Kimberly Miles test result is considered negative (normal).  This means that we have not identified a hereditary cause for her personal and family history of cancer at this time. Most cancers happen by chance and this negative test suggests that her cancer may fall into this category.    While reassuring, this does not definitively rule out a hereditary predisposition to cancer. It is still possible that there could be genetic mutations that are undetectable by current technology. There could be genetic mutations in genes that have not been tested or identified to increase cancer risk.  Therefore, it is recommended she continue to follow the cancer management and screening guidelines provided by her oncology and primary healthcare provider.  An individual's cancer risk and medical management are not determined by genetic test results alone. Overall cancer risk assessment incorporates additional factors, including personal medical history, family history, and any available genetic information that may result in a personalized plan for cancer prevention and surveillance  RECOMMENDATIONS FOR FAMILY MEMBERS:  Individuals in this family might be at some increased risk of developing cancer, over the general population risk, simply due to the family history of cancer.  We recommended women in this family have a yearly mammogram beginning at age 29, or 70 years younger than the earliest onset of cancer, an annual  clinical breast exam, and perform monthly breast self-exams. Women in this family should also have a gynecological exam as recommended by their primary provider. All family members should have a colonoscopy by age 85.  FOLLOW-UP: Lastly, we discussed with Kimberly Miles that cancer genetics is a rapidly advancing field and it is possible that new genetic tests will be appropriate for her and/or her family members in the future. We encouraged her to remain in contact with cancer genetics on an annual basis so we can update her personal and family histories and let her know of advances in cancer genetics that may benefit this family.   Our contact number was provided. Kimberly Miles questions were answered to her satisfaction, and she knows she is welcome to call us at anytime with additional questions or concerns.   Roma Kayser, [place], [facility] Licensed, Certified Genetic Counselor [email or web address]

## 2019-09-11 DIAGNOSIS — Z923 Personal history of irradiation: Secondary | ICD-10-CM | POA: Diagnosis not present

## 2019-09-11 DIAGNOSIS — Z853 Personal history of malignant neoplasm of breast: Secondary | ICD-10-CM | POA: Diagnosis not present

## 2019-09-12 DIAGNOSIS — H04123 Dry eye syndrome of bilateral lacrimal glands: Secondary | ICD-10-CM | POA: Diagnosis not present

## 2019-09-12 DIAGNOSIS — H2513 Age-related nuclear cataract, bilateral: Secondary | ICD-10-CM | POA: Diagnosis not present

## 2019-09-12 DIAGNOSIS — D3132 Benign neoplasm of left choroid: Secondary | ICD-10-CM | POA: Diagnosis not present

## 2019-09-12 DIAGNOSIS — H25013 Cortical age-related cataract, bilateral: Secondary | ICD-10-CM | POA: Diagnosis not present

## 2019-09-12 DIAGNOSIS — H11441 Conjunctival cysts, right eye: Secondary | ICD-10-CM | POA: Diagnosis not present

## 2019-09-12 LAB — HM DIABETES EYE EXAM

## 2019-10-02 ENCOUNTER — Other Ambulatory Visit: Payer: Self-pay

## 2019-10-02 NOTE — Progress Notes (Signed)
64 y.o. G2P2 Married Caucasian female here for annual exam.  Doing well.  Had breast cancer last year.  Had final follow up with Dr. Iran Planas after her reconstruction.  Doesn't need follow up if there aren't any changes.  On Arimidex x 7 years.  Having some GI issues and she thinks this may be related.  Has bone density ordered.  Pt will plan to schedule this year.  Last BMD was 11/16/17.    Denies vaginal bleeding.    MMG is planned for August and then MRI due in December.  Patient's last menstrual period was 08/25/2010.          Sexually active: Yes.    The current method of family planning is post menopausal status.    Exercising: Yes.    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  Pt aware I will do the paperwork for this in 03/2020. BMD: 11-16-2017 TDaP:  2015 Pneumonia vaccine(s):  2015 Shingrix:   Not done Hep C testing: 2015 neg Screening Labs: CMP and lipids obtained today   reports that she has never smoked. She has never used smokeless tobacco. She reports previous alcohol use. She reports that she does not use drugs.  Past Medical History:  Diagnosis Date  . Anxiety   . Arthritis   . Cancer ([place]) 11/2018   right breast cancer  . Common migraine with intractable migraine 04/06/2016  . Complication of anesthesia   . Diverticulosis   . Elevated LFTs    monitored by pcp per pt --  unknown etiology  . Family history of premature CAD 09/01/2013  . Family history of prostate cancer   . Family history of uterine cancer   . History of basal cell carcinoma excision    2004   &  2014  . History of colitis   . History of palpitations   . 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 IBS (irritable bowel syndrome)   . Migraine   . MTHFR gene mutation ([place])   . PMB (postmenopausal bleeding)   . PONV (postoperative nausea and vomiting)   . Post-surgical hypothyroidism     Past Surgical History:  Procedure Laterality Date  . APPENDECTOMY  as child  . BREAST BIOPSY Left 1979   benign  . BREAST EXCISIONAL BIOPSY Left   . BREAST LUMPECTOMY WITH RADIOACTIVE SEED AND SENTINEL LYMPH NODE BIOPSY Right 02/04/2019   Procedure: RIGHT BREAST LUMPECTOMY WITH RADIOACTIVE SEED AND RIGHT AXILLARY DEEP SENTINEL LYMPH NODE BIOPSY WITH BLUE DYE INJECTION;  Surgeon: Fanny Skates, MD;  Location: [facility] [facility];  Service: General;  Laterality: Right;  . BREAST REDUCTION SURGERY Bilateral 02/11/2019   Procedure: RIGHT BREAST ONCOPLASTIC RECONSTRUCTION, LEFT BREAST REDUCTION;  Surgeon: Irene Limbo, MD;  Location: [facility];  Service: Plastics;  Laterality: Bilateral;  . CARDIOVASCULAR STRESS TEST  09-16-2013   normal nuclear study w/ no ischemia/  normal LV function and wall motion , ef 84%  . CESAREAN SECTION  1977 and 1978   Bilateral Tubal Ligation w/ last c/s  . COLPOSCOPY  05/2009   CIN 1  . HYSTEROSCOPY WITH D & C N/A 12/23/2015  Procedure: DILATATION AND CURETTAGE /HYSTEROSCOPY ;  Surgeon: Megan Salon, MD;  Location[facility];  Service: Gynecology;  Laterality: N/A;  . NEGATIVE SLEEP STUDY  2014  per pt  . OOPHORECTOMY Right 1985   ruptured cyst   . THYROID LOBECTOMY Right 03-08-2011  . TRANSTHORACIC ECHOCARDIOGRAM  05-09-2011   ef 64%/  mild MR and TR  . TUBAL LIGATION      Current Outpatient Medications  Medication Sig Dispense Refill  . anastrozole (ARIMIDEX) 1 MG tablet Take 1 tablet (1 mg total) by mouth daily. 90 tablet 3  . Ascorbic Acid (VITAMIN C) 1000 MG tablet Take 1,000 mg by mouth daily. 2 tablet daily    . Cholecalciferol (VITAMIN D3) 5000 UNITS CAPS Take 1 capsule by mouth daily.    .  cyclobenzaprine (FLEXERIL) 5 MG tablet Take 1 tablet by mouth daily as needed.     Arna Medici 75 MCG tablet TAKE 1 TABLET BY MOUTH ONCE DAILY BEFORE BREAKFAST 90 tablet 2  . iodine-sodium iodide 2-2.4 % solution Apply topically once.     . Melatonin 3 MG TABS Take 3 mg by mouth at bedtime.     . rizatriptan (MAXALT) 10 MG tablet TAKE 1 TABLET BY MOUTH AS NEEDED FOR MIGRAINE, MAY REPEAT IN 2 HOURS IF NEEDED 12 tablet 3  . UNABLE TO FIND Med Name: magnesium spray, she makes herself from magnesium salts and water.  She sprays it on topically    . zinc gluconate 50 MG tablet Take 50 mg by mouth daily.    Marland Kitchen zolpidem (AMBIEN) 5 MG tablet Take 1 tablet (5 mg total) by mouth at bedtime as needed for sleep. 30 tablet 0   No current facility-administered medications for this visit.    Family History  Problem Relation Age of Onset  . Hypertension Maternal Grandmother   . Uterine cancer Maternal Grandmother 82  . Vascular Disease Maternal Grandmother   . Lung cancer Maternal Grandmother 59  . Stroke Maternal Grandfather   . Prostate cancer Paternal Grandfather 40  . Migraines Paternal Grandmother   . Lung cancer Mother 24  . Hypertension Mother 22  . Hyperlipidemia Mother 16  . Ehlers-Danlos syndrome Mother   . Kidney disease Mother   . Fibromyalgia Mother   . Other Father        bypass surgery times 2  . Migraines Father   . Osteoporosis Father   . Kidney disease Maternal Aunt     Review of Systems  Constitutional: Negative.   HENT: Negative.   Eyes: Negative.   Respiratory: Negative.   Cardiovascular: Negative.   Gastrointestinal: Negative.   Endocrine: Negative.   Genitourinary: Negative.   Musculoskeletal: Negative.   Skin: Negative.   Allergic/Immunologic: Negative.   Neurological: Negative.   Psychiatric/Behavioral: Negative.     Exam:   BP 122/66 (BP Location: Right Arm, Patient Position: Sitting, Cuff Size: Normal)   Pulse 76   Temp 98.1 F (36.7 C) (Temporal)   Ht  5' (1.524 m)   Wt 113 lb (51.3 kg)   LMP 08/25/2010   BMI 22.07 kg/m   Height: 5' (152.4 cm)  Ht Readings from Last 3 Encounters:  10/03/19 5' (1.524 m)  04/07/19 4\' 11"  (1.499 m)  03/24/19 4\' 11"  (1.499 m)   General appearance: alert, cooperative and appears stated age Head: Normocephalic, without obvious abnormality, atraumatic Neck: no adenopathy, supple, symmetrical, trachea midline and thyroid normal to inspection and palpation Lungs: clear to auscultation bilaterally  Breasts: normal appearance, no masses or tenderness Heart: regular rate and rhythm Abdomen: soft, non-tender; bowel sounds normal; no masses,  no organomegaly Extremities: extremities normal, atraumatic, no cyanosis or edema Skin: Skin color, texture, turgor normal. No rashes or lesions Lymph nodes: Cervical, supraclavicular, and axillary nodes normal. No abnormal inguinal nodes palpated Neurologic: Grossly normal  Pelvic: External genitalia:  no lesions              Urethra:  normal appearing urethra with no masses, tenderness or lesions              Bartholins and Skenes: normal                 Vagina: normal appearing vagina with normal color and discharge, no lesions              Cervix: no lesions              Pap taken: No. Bimanual Exam:  Uterus:  normal size, contour, position, consistency, mobility, non-tender              Adnexa: normal adnexa and no mass, fullness, tenderness               Rectovaginal: Confirms               Anus:  normal sphincter tone, no lesions  Chaperone, Royal Hawthorn, CMA, was present for exam.  A:  Well Woman with normal exam PMP, no HRT Vaginal atrophic changes Hypothyroidism  P:   Mammogram guidelines reviewed pap smear with neg HR HPV 2018, neg pap 2020, not indicated today CMP, fasting lipids obtained today Cologuard due later this year.  We will order this. Pt planning BMD after 5/24/20201 Vaccines reviewed.  Declines Covid vaccination, shingrix or pneumonia at  this time. return annually or prn

## 2019-10-03 ENCOUNTER — Ambulatory Visit (INDEPENDENT_AMBULATORY_CARE_PROVIDER_SITE_OTHER): Payer: Medicare Other | Admitting: Obstetrics & Gynecology

## 2019-10-03 ENCOUNTER — Encounter: Payer: Self-pay | Admitting: Obstetrics & Gynecology

## 2019-10-03 VITALS — BP 122/66 | HR 76 | Temp 98.1°F | Ht 60.0 in | Wt 113.0 lb

## 2019-10-03 DIAGNOSIS — R7401 Elevation of levels of liver transaminase levels: Secondary | ICD-10-CM | POA: Diagnosis not present

## 2019-10-03 DIAGNOSIS — E7800 Pure hypercholesterolemia, unspecified: Secondary | ICD-10-CM | POA: Diagnosis not present

## 2019-10-03 DIAGNOSIS — Z01419 Encounter for gynecological examination (general) (routine) without abnormal findings: Secondary | ICD-10-CM | POA: Diagnosis not present

## 2019-10-04 LAB — COMPREHENSIVE METABOLIC PANEL
ALT: 41 IU/L — ABNORMAL HIGH (ref 0–32)
AST: 33 IU/L (ref 0–40)
Albumin/Globulin Ratio: 2.8 — ABNORMAL HIGH (ref 1.2–2.2)
Albumin: 4.7 g/dL (ref 3.8–4.8)
Alkaline Phosphatase: 89 IU/L (ref 39–117)
BUN/Creatinine Ratio: 12 (ref 12–28)
BUN: 7 mg/dL — ABNORMAL LOW (ref 8–27)
Bilirubin Total: 0.3 mg/dL (ref 0.0–1.2)
CO2: 25 mmol/L (ref 20–29)
Calcium: 9.1 mg/dL (ref 8.7–10.3)
Chloride: 103 mmol/L (ref 96–106)
Creatinine, Ser: 0.58 mg/dL (ref 0.57–1.00)
GFR calc Af Amer: 111 mL/min/{1.73_m2} (ref >59)
GFR calc non Af Amer: 96 mL/min/{1.73_m2} (ref >59)
Globulin, Total: 1.7 g/dL (ref 1.5–4.5)
Glucose: 84 mg/dL (ref 65–99)
Potassium: 4 mmol/L (ref 3.5–5.2)
Sodium: 142 mmol/L (ref 134–144)
Total Protein: 6.4 g/dL (ref 6.0–8.5)

## 2019-10-04 LAB — LIPID PANEL
Chol/HDL Ratio: 2.7 ratio (ref 0.0–4.4)
Cholesterol, Total: 244 mg/dL — ABNORMAL HIGH (ref 100–199)
HDL: 91 mg/dL (ref >39)
LDL Chol Calc (NIH): 137 mg/dL — ABNORMAL HIGH (ref 0–99)
Triglycerides: 94 mg/dL (ref 0–149)
VLDL Cholesterol Cal: 16 mg/dL (ref 5–40)

## 2019-11-20 DIAGNOSIS — M8588 Other specified disorders of bone density and structure, other site: Secondary | ICD-10-CM | POA: Diagnosis not present

## 2019-11-20 DIAGNOSIS — M810 Age-related osteoporosis without current pathological fracture: Secondary | ICD-10-CM | POA: Diagnosis not present

## 2019-11-26 ENCOUNTER — Telehealth: Payer: Self-pay | Admitting: Hematology and Oncology

## 2019-11-26 NOTE — Telephone Encounter (Signed)
Rescheduled appt on 6/18 to 6/24. Provider on pal. Pt aware of appt

## 2019-12-12 ENCOUNTER — Ambulatory Visit: Payer: Medicare Other | Admitting: Hematology and Oncology

## 2019-12-13 ENCOUNTER — Other Ambulatory Visit: Payer: Self-pay | Admitting: Hematology and Oncology

## 2019-12-13 ENCOUNTER — Other Ambulatory Visit: Payer: Self-pay | Admitting: Family Medicine

## 2019-12-13 ENCOUNTER — Other Ambulatory Visit: Payer: Self-pay | Admitting: Obstetrics & Gynecology

## 2019-12-13 DIAGNOSIS — G43009 Migraine without aura, not intractable, without status migrainosus: Secondary | ICD-10-CM

## 2019-12-15 NOTE — Telephone Encounter (Signed)
Medication refill request: Euthyrox 75 mcg  Last AEX:  10-03-19 SM  Next AEX: 10-18-20  Last MMG (if hormonal medication request): n/a Refill authorized: Today, please advise.   Medication pended for #90, 3 RF. Please refill if appropriate.

## 2019-12-17 NOTE — Progress Notes (Signed)
Patient Care Team: Carollee Herter, Alferd Apa, DO as PCP - General (Family Medicine) Orie Rout, MD as Referring Physician (Specialist) Elveria Rising, MD as Consulting Physician (Obstetrics and Gynecology) Izora Gala, MD as Consulting Physician (Otolaryngology) Megan Salon, MD as Consulting Physician (Gynecology) Nicholas Lose, MD as Consulting Physician (Hematology and Oncology) Eppie Gibson, MD as Attending Physician (Radiation Oncology) Irene Limbo, MD as Consulting Physician (Plastic Surgery) Fanny Skates, MD as Consulting Physician (General Surgery)  DIAGNOSIS:    ICD-10-CM   1. 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, estrogen receptor positive ([place])  12/24/2018 Initial Diagnosis   Palpable lump in the right breast, 2.1 cm, 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   02/04/2019 Cancer Staging   Staging form: Breast, AJCC 8th Edition - Pathologic stage from 02/04/2019: Stage IA (pT2, pN0, cM0, G1, ER+, PR+, HER2-, Oncotype DX score: 18) - Signed by Gardenia Phlegm, NP on 02/19/2019   02/11/2019 Surgery   Bil Mammaplasty: benign   03/24/2019 - 04/14/2019 Radiation Therapy   The patient initially received a dose of 42.56 Gy in 16 fractions to the breast using whole-breast tangent fields. This was  delivered using a 3-D conformal technique. The total dose was 42.56 Gy.    Oncotype testing   The Oncotype DX score was 18 predicting a risk of outside the breast recurrence over the next 9 years of 5 % if the patient's only systemic therapy is tamoxifen for 5 years.   08/06/2019 Genetic Testing   Negative genetic testing on the common hereditary cancer panel.  The Common Hereditary Gene Panel offered by [facility] includes sequencing and/or deletion duplication testing of the following 48 genes: APC, ATM, AXIN2, BARD1, BMPR1A, BRCA1, BRCA2, BRIP1, CDH1, CDK4, CDKN2A (p14ARF), CDKN2A (p16INK4a), CHEK2, CTNNA1, DICER1, EPCAM (Deletion/duplication testing only), GREM1 (promoter region deletion/duplication testing only), KIT, MEN1, MLH1, MSH2, MSH3, MSH6, MUTYH, NBN, NF1, NHTL1, PALB2, PDGFRA, PMS2, POLD1, POLE, PTEN, RAD50, RAD51C, RAD51D, RNF43, SDHB, SDHC, SDHD, SMAD4, SMARCA4. STK11, TP53, TSC1, TSC2, and VHL.  The following genes were evaluated for sequence changes only: SDHA and HOXB13 c.251G>A variant only. The report date is August 06, 2019.     CHIEF COMPLIANT: Follow-up of right breast cancer on anastrozole  INTERVAL HISTORY: Kimberly Miles is a 64 y.o. with above-mentioned history of right breast cancer treated withneoadjuvant antiestrogen therapy, lumpectomy, radiation, and who is currently on antiestrogen therapy with anastrozole. She presents to the clinic today for follow-up.  She complains that she has not been able to tolerate anastrozole well.  She has lots of aches and pains as well as difficulty with sleeping because of the muscle aches and pains.  Her bone density came back at T score of -3 suggesting osteoporosis. Her other complaint is related to loss of memory and concentration.  She is extremely worried about this.  ALLERGIES:  is allergic to codeine, demerol [meperidine], erythromycin, sulfa antibiotics, ampicillin, and chlorhexidine.  MEDICATIONS:  Current Outpatient  Medications  Medication Sig Dispense Refill  . ASTAXANTHIN PO Take by mouth.    . Multiple Vitamins-Minerals (MULTIVITAMIN WITH MINERALS) tablet Take 1 tablet by mouth daily.    Marland Kitchen OVER THE COUNTER MEDICATION Take 325 mg by mouth daily. Magnesium oil 325 mg daily    . anastrozole (ARIMIDEX) 1 MG tablet Take 1 tablet by mouth once daily 90 tablet 0  . Ascorbic Acid (VITAMIN C) 1000 MG tablet Take 1,000 mg by mouth daily. 2 tablet daily    . Cholecalciferol (VITAMIN D3) 5000 UNITS CAPS Take 1 capsule by mouth daily.    . cyclobenzaprine (FLEXERIL) 5 MG tablet Take 1 tablet by mouth daily as needed.     Arna Medici 75 MCG tablet TAKE 1 TABLET BY MOUTH ONCE DAILY BEFORE BREAKFAST 90 tablet 0  . iodine-sodium iodide 2-2.4 % solution Apply topically once.     . Melatonin 3 MG TABS Take 10 mg by mouth at bedtime.     . rizatriptan (MAXALT) 10 MG tablet TAKE 1 TABLET BY MOUTH AS NEEDED FOR MIGRAINE, MAY REPEAT IN 2 HOURS IF NEEDED 12 tablet 0  . UNABLE TO FIND Med Name: magnesium spray, she makes herself from magnesium salts and water.  She sprays it on topically    . zinc gluconate 50 MG tablet Take 15 mg by mouth daily.     Marland Kitchen zolpidem (AMBIEN) 5 MG tablet Take 1 tablet (5 mg total) by mouth at bedtime as needed for sleep. 30 tablet 0   No current facility-administered medications for this visit.    PHYSICAL EXAMINATION: ECOG PERFORMANCE STATUS: 1 - Symptomatic but completely ambulatory  Vitals:   12/18/19 1020  BP: 128/77  Pulse: 67  Resp: 18  Temp: 98.1 F (36.7 C)  SpO2: 100%   Filed Weights   12/18/19 1020  Weight: 111 lb (50.3 kg)    BREAST: No palpable masses or nodules in either right or left breasts. No palpable axillary supraclavicular or infraclavicular adenopathy no breast tenderness or nipple discharge. (exam performed in the presence of a chaperone)  LABORATORY DATA:  I have reviewed the data as listed CMP Latest Ref Rng & Units 10/03/2019 12/30/2018 09/26/2017  Glucose 65 -  99 mg/dL 84 84 77  BUN 8 - 27 mg/dL 7(L) 7(L) 12  Creatinine 0.57 - 1.00 mg/dL 0.58 0.66 0.55  Sodium 134 - 144 mmol/L 142 140 138  Potassium 3.5 - 5.2 mmol/L 4.0 3.8 4.3  Chloride 96 - 106 mmol/L 103 100 101  CO2 20 - 29 mmol/L 25 24 28   Calcium 8.7 - 10.3 mg/dL 9.1 9.3 9.2  Total Protein 6.0 - 8.5 g/dL 6.4 6.3 6.6  Total Bilirubin 0.0 - 1.2 mg/dL 0.3 0.2 0.5  Alkaline Phos 39 - 117 IU/L 89 71 86  AST 0 - 40 IU/L 33 19 24  ALT 0 - 32 IU/L 41(H) 31 37(H)    Lab Results  Component Value Date   WBC 5.9 03/22/2017   HGB 14.4 03/22/2017   HCT 43.3 03/22/2017   MCV 97.8 03/22/2017   PLT 210.0 03/22/2017   NEUTROABS 4.0 03/22/2017    ASSESSMENT & PLAN:  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, estrogen receptor positive ([place]) 12/09/2018:Palpable lump in the right breast, 2.1 cm, biopsy revealed grade 2 invasive ductal carcinoma that was  ER 100%, PR 50%, Ki-67 50%, HER-2 negative, 2+ by IHC FISH ratio 1.27, Breast MRI: 2.3 cm right lower quadrant enhancement Left breast biopsy: Fibroadenoma Oncotype score 18: Distant recurrence at 9 years: 5% T2N0 stage IB Preoperative anastrozole for 1 month  Right lumpectomy: 02/04/2019([name]): IDC, 2.5cm, grade 1, clear margins, and 3 lymph nodes negative.ER PR positive HER-2 negative, Ki-67 50%  Adjuvant radiation 03/25/2019-04/13/20 ----------------------------------------------------------------- Treatment plan: Adjuvant antiestrogen therapy with anastrozole 1 mg p.o. daily Anastrozole toxicities: 1.  Diffuse muscle aches and pains 2.  Memory loss 3.  Intermittent hot flashes Based on intolerance of the treatment, I recommended that she stop anastrozole at this time. I instructed her to wait 3 weeks and take half a tablet at bedtime. If this does not work then we will switch her to letrozole.   Breast cancer surveillance: Both mammograms and breast MRIs because only breast MRI picked up her cancer. Next mammogram  June 2021 Breast MRI November 2020 Bone density; T score -3  Osteoporosis: Lengthy discussion about treatment options.  I suggested Prolia because she already has esophagitis.  Since she will have tooth extraction next month we will have to wait at least a couple of months after that to begin this treatment.  We discussed on the phone I will discuss potential start date for Prolia.  Patient wants to consider holistic treatment options and I discussed with her that they are not evidence-based.  RTC in 2 months for MyChart virtual visit to discuss tolerability to anastrozole at half a tablet daily.    Orders Placed This Encounter  Procedures  . MM DIAG BREAST TOMO BILATERAL    Standing Status:   Future    Standing Expiration Date:   12/17/2020    Order Specific Question:   Reason for Exam (SYMPTOM  OR DIAGNOSIS REQUIRED)    Answer:   H/O breast cancer annual eval    Order Specific Question:   Preferred imaging location?    Answer:   [facility]    Order Specific Question:   Release to patient    Answer:   Immediate   The patient has a good understanding of the overall plan. she agrees with it. she will call with any problems that may develop before the next visit here.  Total time spent: 20 mins including face to face time and time spent for planning, charting and coordination of care  Nicholas Lose, MD 12/18/2019  I, Cloyde Reams Dorshimer, am acting as scribe for Dr. Nicholas Lose.  I have reviewed the above documentation for accuracy and completeness, and I agree with the above.

## 2019-12-17 NOTE — Telephone Encounter (Signed)
Rx completed for #90/0RF.  Please contact pharmacy and have next RF's sent to Dr. Etter Sjogren who normally writes this for pt.

## 2019-12-18 ENCOUNTER — Telehealth: Payer: Self-pay | Admitting: Hematology and Oncology

## 2019-12-18 ENCOUNTER — Other Ambulatory Visit: Payer: Self-pay

## 2019-12-18 ENCOUNTER — Inpatient Hospital Stay: Payer: Medicare Other | Attending: Hematology and Oncology | Admitting: Hematology and Oncology

## 2019-12-18 DIAGNOSIS — R413 Other amnesia: Secondary | ICD-10-CM | POA: Diagnosis not present

## 2019-12-18 DIAGNOSIS — Z170 Estrogen receptor positive status [ER+]: Secondary | ICD-10-CM | POA: Diagnosis not present

## 2019-12-18 DIAGNOSIS — Z9221 Personal history of antineoplastic chemotherapy: Secondary | ICD-10-CM | POA: Insufficient documentation

## 2019-12-18 DIAGNOSIS — C50311 Malignant neoplasm of lower-inner quadrant of right female breast: Secondary | ICD-10-CM | POA: Insufficient documentation

## 2019-12-18 DIAGNOSIS — M791 Myalgia, unspecified site: Secondary | ICD-10-CM | POA: Diagnosis not present

## 2019-12-18 DIAGNOSIS — Z923 Personal history of irradiation: Secondary | ICD-10-CM | POA: Insufficient documentation

## 2019-12-18 NOTE — Telephone Encounter (Signed)
Scheduled appts per 6/24 los. Gave pt a print out of AVS.  

## 2019-12-18 NOTE — Telephone Encounter (Signed)
Call to [facility], spoke with Tia. Advised Tia refill requests for Euthyrox need to be filled through patient's PCP. Tia states she made a note on patient's chart, but request generates and sends to last provider that filled prescription. Asked for office to deny request and fax back to pharmacy stating request needs to be sent to PCP. RN advised made note in patient's chart.   Encounter closed.

## 2019-12-18 NOTE — Assessment & Plan Note (Signed)
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 daily  Breast cancer surveillance: Both mammograms and breast MRIs because only breast MRI picked up her cancer. Next mammogram June 2021 Breast MRI November 2020 Bone density; T score -3  RTC in 1 year

## 2020-01-01 ENCOUNTER — Encounter: Payer: Self-pay | Admitting: Hematology and Oncology

## 2020-01-06 ENCOUNTER — Ambulatory Visit
Admission: RE | Admit: 2020-01-06 | Discharge: 2020-01-06 | Disposition: A | Discharge status: Home / Self Care | Payer: Medicare Other | Source: Ambulatory Visit | Attending: Hematology and Oncology | Admitting: Hematology and Oncology

## 2020-01-06 ENCOUNTER — Other Ambulatory Visit: Payer: Self-pay

## 2020-01-06 DIAGNOSIS — C50311 Malignant neoplasm of lower-inner quadrant of right female breast: Secondary | ICD-10-CM

## 2020-01-06 DIAGNOSIS — Z170 Estrogen receptor positive status [ER+]: Secondary | ICD-10-CM

## 2020-01-06 HISTORY — DX: Personal history of irradiation: Z92.3

## 2020-01-15 ENCOUNTER — Telehealth: Payer: Self-pay | Admitting: Adult Health

## 2020-01-15 NOTE — Telephone Encounter (Signed)
Rescheduled appointment per 7/22 provider message. Patient is aware of updated appointment date and time.

## 2020-01-23 DIAGNOSIS — H0102A Squamous blepharitis right eye, upper and lower eyelids: Secondary | ICD-10-CM | POA: Diagnosis not present

## 2020-01-23 DIAGNOSIS — H11441 Conjunctival cysts, right eye: Secondary | ICD-10-CM | POA: Diagnosis not present

## 2020-01-23 DIAGNOSIS — H524 Presbyopia: Secondary | ICD-10-CM | POA: Diagnosis not present

## 2020-01-23 DIAGNOSIS — H04123 Dry eye syndrome of bilateral lacrimal glands: Secondary | ICD-10-CM | POA: Diagnosis not present

## 2020-02-11 NOTE — Progress Notes (Signed)
HEMATOLOGY-ONCOLOGY [facility] VIDEO VISIT PROGRESS NOTE  I connected with Kimberly Miles on 02/12/2020 at  9:15 AM EDT by MyChart video conference and verified that I am speaking with the correct person using two identifiers.  I discussed the limitations, risks, security and privacy concerns of performing an evaluation and management service by MyChart and the availability of in person appointments.  I also discussed with the patient that there may be a patient responsible charge related to this service. The patient expressed understanding and agreed to proceed.  Patient's Location: Home Physician Location: Clinic  CHIEF COMPLIANT: Follow-up of right breast cancer on anastrozole  INTERVAL HISTORY: Kimberly Miles is a 64 y.o. female with above-mentioned history of right breast cancer treated withneoadjuvant antiestrogen therapy, lumpectomy, radiation, and who is currently on antiestrogen therapy with anastrozole.Mammogram on 01/06/20 showed no evidence of malignancy bilaterally. She presents over MyChart todayfor follow-up.   She was unable to tolerate anastrozole in spite of cutting the dosage in half.  She tells me that her fogginess in the head may have cleared up a little bit but the joint aches and stiffness continued to be a problem.  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, estrogen receptor positive ([place])  12/24/2018 Initial Diagnosis   Palpable lump in the right breast, 2.1 cm, biopsy revealed grade 2 invasive ductal carcinoma that was ER 100%, PR 50%, Ki-67 50%, HER-2 negative, 2+ by IHC FISH ratio 1.27, T2N0 stage IB   12/24/2018 Cancer Staging   Staging form: Breast, AJCC 8th Edition - Clinical: Stage IB (cT2, cN0, cM0, G2, ER+, PR+, HER2-) - Signed by Eppie Gibson, MD on 12/24/2018   11/2018 - 11/2023 Anti-estrogen oral therapy   Anastrozole 1 mg   02/04/2019 Surgery   Right lumpectomy [facility] Batman): IDC, 2.5cm, grade 1, clear margins,  and 3 lymph nodes negative.   02/04/2019 Cancer Staging   Staging form: Breast, AJCC 8th Edition - Pathologic stage from 02/04/2019: Stage IA (pT2, pN0, cM0, G1, ER+, PR+, HER2-, Oncotype DX score: 18) - Signed by Gardenia Phlegm, NP on 02/19/2019   02/11/2019 Surgery   Bil Mammaplasty: benign   03/24/2019 - 04/14/2019 Radiation Therapy   The patient initially received a dose of 42.56 Gy in 16 fractions to the breast using whole-breast tangent fields. This was delivered using a 3-D conformal technique. The total dose was 42.56 Gy.    Oncotype testing   The Oncotype DX score was 18 predicting a risk of outside the breast recurrence over the next 9 years of 5 % if the patient's only systemic therapy is tamoxifen for 5 years.   08/06/2019 Genetic Testing   Negative genetic testing on the common hereditary cancer panel.  The Common Hereditary Gene Panel offered by [facility] includes sequencing and/or deletion duplication testing of the following 48 genes: APC, ATM, AXIN2, BARD1, BMPR1A, BRCA1, BRCA2, BRIP1, CDH1, CDK4, CDKN2A (p14ARF), CDKN2A (p16INK4a), CHEK2, CTNNA1, DICER1, EPCAM (Deletion/duplication testing only), GREM1 (promoter region deletion/duplication testing only), KIT, MEN1, MLH1, MSH2, MSH3, MSH6, MUTYH, NBN, NF1, NHTL1, PALB2, PDGFRA, PMS2, POLD1, POLE, PTEN, RAD50, RAD51C, RAD51D, RNF43, SDHB, SDHC, SDHD, SMAD4, SMARCA4. STK11, TP53, TSC1, TSC2, and VHL.  The following genes were evaluated for sequence changes only: SDHA and HOXB13 c.251G>A variant only. The report date is August 06, 2019.     Observations/Objective:  There were no vitals filed for this visit. There is no height or weight on file to calculate BMI.  I have reviewed the data  as listed CMP Latest Ref Rng & Units 10/03/2019 12/30/2018 09/26/2017  Glucose 65 - 99 mg/dL 84 84 77  BUN 8 - 27 mg/dL 7(L) 7(L) 12  Creatinine 0.57 - 1.00 mg/dL 0.58 0.66 0.55  Sodium 134 - 144 mmol/L 142 140 138  Potassium 3.5 - 5.2 mmol/L  4.0 3.8 4.3  Chloride 96 - 106 mmol/L 103 100 101  CO2 20 - 29 mmol/L _0 Calcium 8.7 - 10.3 mg/dL 9.1 9.3 9.2  Total Protein 6.0 - 8.5 g/dL 6.4 6.3 6.6  Total Bilirubin 0.0 - 1.2 mg/dL 0.3 0.2 0.5  Alkaline Phos 39 - 117 IU/L 89 71 86  AST 0 - 40 IU/L 33 19 24  ALT 0 - 32 IU/L 41(H) 31 37(H)    Lab Results  Component Value Date   WBC 5.9 03/22/2017   HGB 14.4 03/22/2017   HCT 43.3 03/22/2017   MCV 97.8 03/22/2017   PLT 210.0 03/22/2017   NEUTROABS 4.0 03/22/2017      Assessment Plan:  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, estrogen receptor positive ([place]) 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 daily decrease to half a tablet daily 12/18/2019 discontinued because of continued muscle aches and pains Switched to letrozole 02/25/2020 we will start at half a tablet daily  Anastrozole toxicities: 1.  Diffuse muscle aches and pains 2.  Memory loss 3.  Intermittent hot flashes  Breast cancer surveillance: Both mammograms and breast MRIs because only breast MRI picked up her cancer. Mammogram 01/06/2020 no mammographic evidence of malignancy breast density category C Breast MRI November 2020 Bone density 12/02/2019; T score -3  Osteoporosis: Patient wants to try osteogenic's exercise/specialized equipment  program to improve bone density.  Return to clinic in 1 year for follow-up   I discussed the assessment and treatment plan with the patient. The patient was provided an opportunity to ask questions and all were answered. The patient agreed with the plan and demonstrated an understanding of the instructions. The patient was advised to call back or seek an in-person evaluation if the symptoms worsen or if the condition fails to improve as anticipated.   I provided 20 minutes of face-to-face MyChart video visit time and for charting and coordination of care during this encounter.    Rulon Eisenmenger, MD 02/12/2020   I, Molly Dorshimer, am acting as scribe for Nicholas Lose, MD.  I have reviewed the above documentation for accuracy and completeness, and I agree with the above.

## 2020-02-12 ENCOUNTER — Inpatient Hospital Stay: Payer: Medicare Other | Attending: Hematology and Oncology | Admitting: Hematology and Oncology

## 2020-02-12 DIAGNOSIS — Z170 Estrogen receptor positive status [ER+]: Secondary | ICD-10-CM

## 2020-02-12 DIAGNOSIS — C50311 Malignant neoplasm of lower-inner quadrant of right female breast: Secondary | ICD-10-CM | POA: Diagnosis not present

## 2020-02-12 MED ORDER — LETROZOLE 2.5 MG PO TABS: 2.5000 mg | ORAL_TABLET | Freq: Every day | ORAL | 3 refills | Status: DC

## 2020-02-12 NOTE — Assessment & Plan Note (Signed)
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 daily decrease to half a tablet daily 12/18/2019  Anastrozole toxicities: 1.  Diffuse muscle aches and pains 2.  Memory loss 3.  Intermittent hot flashes  Breast cancer surveillance: Both mammograms and breast MRIs because only breast MRI picked up her cancer. Mammogram 01/06/2020 no mammographic evidence of malignancy breast density category C Breast MRI November 2020 Bone density 12/02/2019; T score -3  Osteoporosis:   Patient wants to consider holistic treatment options and I discussed with her that they are not evidence-based Return to clinic in 1 year for follow-up

## 2020-02-13 ENCOUNTER — Telehealth: Payer: Self-pay | Admitting: Hematology and Oncology

## 2020-02-13 NOTE — Telephone Encounter (Signed)
Scheduled appts per 8/20 los. Pt confirmed appt date and time.  °

## 2020-03-24 NOTE — Progress Notes (Signed)
HEMATOLOGY-ONCOLOGY MYCHART VIDEO VISIT PROGRESS NOTE  I connected with Kimberly Miles on 03/25/2020 at 10:00 AM EDT by MyChart video conference and verified that I am speaking with the correct person using two identifiers.  I discussed the limitations, risks, security and privacy concerns of performing an evaluation and management service by MyChart and the availability of in person appointments.  I also discussed with the patient that there may be a patient responsible charge related to this service. The patient expressed understanding and agreed to proceed.  Patient's Location: Home Physician Location: Clinic  CHIEF COMPLIANT: Follow-up of right breast cancer    INTERVAL HISTORY: Kimberly Miles is a 64 y.o. female with above-mentioned history of right breast cancertreated withneoadjuvant antiestrogen therapy,lumpectomy,radiation, and who could not tolerate antiestrogen therapy with anastrozole. She presents over MyChart todayfor follow-up. We wanted to switch to letrozole.  However she has not started yet.  She wants to work on improving her bone density before she begins further antiestrogen therapy.  She tells me that since she has come off anastrozole her energy levels have improved.  She is also water surfing and staying active and every day she feels better.  She wants to consider letrozole treatment only June 26, 2020.  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, estrogen receptor positive ([place])  12/24/2018 Initial Diagnosis   Palpable lump in the right breast, 2.1 cm, biopsy revealed grade 2 invasive ductal carcinoma that was ER 100%, PR 50%, Ki-67 50%, HER-2 negative, 2+ by IHC FISH ratio 1.27, T2N0 stage IB   12/24/2018 Cancer Staging   Staging form: Breast, AJCC 8th Edition - Clinical: Stage IB (cT2, cN0, cM0, G2, ER+, PR+, HER2-) - Signed by Eppie Gibson, MD on 12/24/2018   11/2018 - 11/2023 Anti-estrogen oral therapy    Anastrozole 1 mg   02/04/2019 Surgery   Right lumpectomy [facility] Batman): IDC, 2.5cm, grade 1, clear margins, and 3 lymph nodes negative.   02/04/2019 Cancer Staging   Staging form: Breast, AJCC 8th Edition - Pathologic stage from 02/04/2019: Stage IA (pT2, pN0, cM0, G1, ER+, PR+, HER2-, Oncotype DX score: 18) - Signed by Gardenia Phlegm, NP on 02/19/2019   02/11/2019 Surgery   Bil Mammaplasty: benign   03/24/2019 - 04/14/2019 Radiation Therapy   The patient initially received a dose of 42.56 Gy in 16 fractions to the breast using whole-breast tangent fields. This was delivered using a 3-D conformal technique. The total dose was 42.56 Gy.    Oncotype testing   The Oncotype DX score was 18 predicting a risk of outside the breast recurrence over the next 9 years of 5 % if the patient's only systemic therapy is tamoxifen for 5 years.   08/06/2019 Genetic Testing   Negative genetic testing on the common hereditary cancer panel.  The Common Hereditary Gene Panel offered by [facility] includes sequencing and/or deletion duplication testing of the following 48 genes: APC, ATM, AXIN2, BARD1, BMPR1A, BRCA1, BRCA2, BRIP1, CDH1, CDK4, CDKN2A (p14ARF), CDKN2A (p16INK4a), CHEK2, CTNNA1, DICER1, EPCAM (Deletion/duplication testing only), GREM1 (promoter region deletion/duplication testing only), KIT, MEN1, MLH1, MSH2, MSH3, MSH6, MUTYH, NBN, NF1, NHTL1, PALB2, PDGFRA, PMS2, POLD1, POLE, PTEN, RAD50, RAD51C, RAD51D, RNF43, SDHB, SDHC, SDHD, SMAD4, SMARCA4. STK11, TP53, TSC1, TSC2, and VHL.  The following genes were evaluated for sequence changes only: SDHA and HOXB13 c.251G>A variant only. The report date is August 06, 2019.     Observations/Objective:  There were no vitals filed for this visit. There is  no height or weight on file to calculate BMI.  I have reviewed the data as listed CMP Latest Ref Rng & Units 10/03/2019 12/30/2018 09/26/2017  Glucose 65 - 99 mg/dL 84 84 77  BUN 8 - 27 mg/dL 7(L) 7(L) 12    Creatinine 0.57 - 1.00 mg/dL 0.58 0.66 0.55  Sodium 134 - 144 mmol/L 142 140 138  Potassium 3.5 - 5.2 mmol/L 4.0 3.8 4.3  Chloride 96 - 106 mmol/L 103 100 101  CO2 20 - 29 mmol/L 25 24 28   Calcium 8.7 - 10.3 mg/dL 9.1 9.3 9.2  Total Protein 6.0 - 8.5 g/dL 6.4 6.3 6.6  Total Bilirubin 0.0 - 1.2 mg/dL 0.3 0.2 0.5  Alkaline Phos 39 - 117 IU/L 89 71 86  AST 0 - 40 IU/L 33 19 24  ALT 0 - 32 IU/L 41(H) 31 37(H)    Lab Results  Component Value Date   WBC 5.9 03/22/2017   HGB 14.4 03/22/2017   HCT 43.3 03/22/2017   MCV 97.8 03/22/2017   PLT 210.0 03/22/2017   NEUTROABS 4.0 03/22/2017      Assessment Plan:  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, estrogen receptor positive ([place]) 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 daily decrease to half a tablet daily 12/18/2019 discontinued because of continued muscle aches and pains Switched to letrozole half a tablet daily (plan to start Jan 1st 2022)  Patient wants to work on her bone density before she wants to tackle the antiestrogen pill.  She is on a 12-week program for bone density.  This is through a multivitamin supplement made by [employer].   Breast cancer surveillance: Both mammograms and breast MRIs because only breast MRI picked up her cancer. Mammogram 01/06/2020  no mammographic evidence of malignancy breast density category C Breast MRI November 2020: Needs a new breast MRI Bone density 12/02/2019; T score -3  Osteoporosis: Patient wants to try osteogenic's exercise/specialized equipment program to improve bone density.  Return to clinic in February 2022 with a MyChart virtual visit    I discussed the assessment and treatment plan with the patient. The patient was provided an opportunity to ask questions and all were answered. The patient agreed with the plan and demonstrated an understanding of the instructions. The patient was advised to call back or seek an in-person evaluation if the symptoms worsen or if the condition fails to improve as anticipated.   I provided 20 minutes of face-to-face MyChart video visit time and for review and coordination of care during this encounter.    Rulon Eisenmenger, MD 03/25/2020   I, Molly Dorshimer, am acting as scribe for Nicholas Lose, MD.  I have reviewed the above documentation for accuracy and completeness, and I agree with the above.

## 2020-03-25 ENCOUNTER — Telehealth (HOSPITAL_BASED_OUTPATIENT_CLINIC_OR_DEPARTMENT_OTHER): Payer: Medicare Other | Admitting: Hematology and Oncology

## 2020-03-25 DIAGNOSIS — Z170 Estrogen receptor positive status [ER+]: Secondary | ICD-10-CM | POA: Diagnosis not present

## 2020-03-25 DIAGNOSIS — C50311 Malignant neoplasm of lower-inner quadrant of right female breast: Secondary | ICD-10-CM

## 2020-03-25 NOTE — Assessment & Plan Note (Signed)
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 daily decrease to half a tablet daily 12/18/2019 discontinued because of continued muscle aches and pains Switched to letrozole 02/25/2020 half a tablet daily  Letrozole toxicities:   Breast cancer surveillance: Both mammograms and breast MRIs because only breast MRI picked up her cancer. Mammogram 01/06/2020 no mammographic evidence of malignancy breast density category C Breast MRI November 2020: Needs a new breast MRI Bone density 12/02/2019; T score -3  Osteoporosis: Patient wants to try osteogenic's exercise/specialized equipment program to improve bone density.  Return to clinic in 1 year for follow-up

## 2020-03-29 ENCOUNTER — Other Ambulatory Visit: Payer: Self-pay | Admitting: Obstetrics & Gynecology

## 2020-03-29 DIAGNOSIS — I890 Lymphedema, not elsewhere classified: Secondary | ICD-10-CM | POA: Diagnosis not present

## 2020-03-29 DIAGNOSIS — C50311 Malignant neoplasm of lower-inner quadrant of right female breast: Secondary | ICD-10-CM | POA: Diagnosis not present

## 2020-03-31 ENCOUNTER — Ambulatory Visit: Payer: Medicare Other | Attending: General Surgery | Admitting: Physical Therapy

## 2020-03-31 ENCOUNTER — Encounter: Payer: Self-pay | Admitting: Physical Therapy

## 2020-03-31 ENCOUNTER — Other Ambulatory Visit: Payer: Self-pay

## 2020-03-31 DIAGNOSIS — R293 Abnormal posture: Secondary | ICD-10-CM | POA: Diagnosis not present

## 2020-03-31 DIAGNOSIS — I890 Lymphedema, not elsewhere classified: Secondary | ICD-10-CM

## 2020-03-31 NOTE — Therapy (Signed)
Kimberly Miles, [place], [address] Phone: [phone]   Fax:  [phone]  Physical Therapy Evaluation  Patient Details  Name: Kimberly Miles MRN: [id number] Date of Birth: [birth year] Referring Provider (PT): Barry Dienes   Encounter Date: 03/31/2020   PT End of Session - 03/31/20 1051    Visit Number 1    Number of Visits 9    Date for PT Re-Evaluation 04/28/20    PT Start Time 1007    PT Stop Time 0177    PT Time Calculation (min) 41 min    Activity Tolerance Patient tolerated treatment well    Behavior During Therapy [facility] for tasks assessed/performed           Past Medical History:  Diagnosis Date   Anxiety    Arthritis    Cancer ([place]) 11/2018   right breast cancer   Common migraine with intractable migraine 93/90/3009   Complication of anesthesia    Diverticulosis    Elevated LFTs    monitored by pcp per pt --  unknown etiology   Family history of premature CAD 09/01/2013   Family history of prostate cancer    Family history of uterine cancer    History of basal cell carcinoma excision    2004   &  2014   History of colitis    History of palpitations    History of thyroid nodule    s/p  right thyroid lobectomy 03-08-2011  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   IBS (irritable bowel syndrome)    Migraine    MTHFR gene mutation    Personal history of radiation therapy    PMB (postmenopausal bleeding)    PONV (postoperative nausea and vomiting)    Post-surgical hypothyroidism     Past Surgical History:  Procedure Laterality Date   APPENDECTOMY  as child   BREAST BIOPSY Left 1979   benign   BREAST EXCISIONAL BIOPSY Left    BREAST LUMPECTOMY Right 01/2019   BREAST LUMPECTOMY WITH RADIOACTIVE SEED AND SENTINEL LYMPH NODE BIOPSY Right 02/04/2019   Procedure: RIGHT BREAST LUMPECTOMY WITH RADIOACTIVE SEED AND RIGHT AXILLARY DEEP SENTINEL LYMPH NODE BIOPSY  WITH BLUE DYE INJECTION;  Surgeon: Fanny Skates, MD;  Location: [place];  Service: General;  Laterality: Right;   BREAST REDUCTION SURGERY Bilateral 02/11/2019   Procedure: RIGHT BREAST ONCOPLASTIC RECONSTRUCTION, LEFT BREAST REDUCTION;  Surgeon: Irene Limbo, MD;  Location: [place];  Service: Plastics;  Laterality: Bilateral;   CARDIOVASCULAR STRESS TEST  09-16-2013   normal nuclear study w/ no ischemia/  normal LV function and wall motion , ef 84%   CESAREAN SECTION  1977 and 1978   Bilateral Tubal Ligation w/ last c/s   COLPOSCOPY  05/2009   CIN 1   HYSTEROSCOPY WITH D & C N/A 12/23/2015   Procedure: DILATATION AND CURETTAGE /HYSTEROSCOPY ;  Surgeon: Megan Salon, MD;  Location: [facility];  Service: Gynecology;  Laterality: N/A;   NEGATIVE SLEEP STUDY  2014  per pt   OOPHORECTOMY Right 1985   ruptured cyst    REDUCTION MAMMAPLASTY Bilateral 01/2019   THYROID LOBECTOMY Right 03-08-2011   TRANSTHORACIC ECHOCARDIOGRAM  05-09-2011   ef 64%/  mild MR and TR   TUBAL LIGATION      There were no vitals filed for this visit.    Subjective Assessment - 03/31/20 1009    Subjective I have some tightness at end range  of motion. My right breast is larger than my left breast.    Pertinent History R breast cancer, s/p lumpectomy and SLNB on 02/04/19, completed radiation, current taking anti estrogen pill    Patient Stated Goals to alleviate some of the soreness, tightness and size of R breast    Currently in Pain? No/denies              [facility] PT Assessment - 03/31/20 0001      Assessment   Medical Diagnosis right breast cancer    Referring Provider (PT) [place]    Onset Date/Surgical Date 02/04/19    Hand Dominance Right    Prior Therapy none      Precautions   Precautions Other (comment)    Precaution Comments lymphedema      Restrictions   Weight Bearing Restrictions No      Balance Screen   Has the patient  fallen in the past 6 months No    Has the patient had a decrease in activity level because of a fear of falling?  No    Is the patient reluctant to leave their home because of a fear of falling?  No      Home Environment   Living Environment Private residence    Living Arrangements Spouse/significant other    Available Help at Discharge Family    Type of Deputy      Prior Function   Level of [place] Retired    Leisure wake surfing 3-5x/wk      Cognition   Overall Cognitive Status Within Functional Limits for tasks assessed      Observation/Other Assessments   Observations Right breast larger than left breast with noticeable pores on right side      Posture/Postural Control   Posture/Postural Control No significant limitations      ROM / Strength   AROM / PROM / Strength AROM      AROM   Right Shoulder Flexion 167 Degrees    Right Shoulder ABduction 180 Degrees    Right Shoulder Internal Rotation 56 Degrees    Right Shoulder External Rotation 90 Degrees    Left Shoulder Flexion 167 Degrees    Left Shoulder ABduction 180 Degrees    Left Shoulder Internal Rotation 52 Degrees    Left Shoulder External Rotation 90 Degrees             LYMPHEDEMA/ONCOLOGY QUESTIONNAIRE - 03/31/20 0001      Type   Cancer Type right breast cancer      Surgeries   Lumpectomy Date 02/04/19    Sentinel Lymph Node Biopsy Date 02/04/19    Number Lymph Nodes Removed 3      Date Lymphedema/Swelling Started   Date 02/04/19      Treatment   Active Chemotherapy Treatment No    Past Chemotherapy Treatment No    Active Radiation Treatment No    Past Radiation Treatment Yes    Current Hormone Treatment Yes    Past Hormone Therapy No      What other symptoms do you have   Are you Having Heaviness or Tightness Yes    Are you having Pain No    Are you having pitting edema No    Is it Hard or Difficult finding clothes that fit No    Do you have infections No     Is there Decreased scar mobility Yes      Lymphedema Assessments   Lymphedema  Assessments Upper extremities      Right Upper Extremity Lymphedema   15 cm Proximal to Olecranon Process 28 cm    Olecranon Process 21.5 cm    15 cm Proximal to Ulnar Styloid Process 21.8 cm    Just Proximal to Ulnar Styloid Process 13.7 cm    Across Hand at [employer] 16.9 cm    At [place] of 2nd Digit 5.7 cm      Left Upper Extremity Lymphedema   15 cm Proximal to Olecranon Process 27.6 cm    Olecranon Process 21.5 cm    15 cm Proximal to Ulnar Styloid Process 22 cm    Just Proximal to Ulnar Styloid Process 14 cm    Across Hand at [employer] 16.5 cm    At [place] of 2nd Digit 5.6 cm                   Outpatient Rehab from 03/31/2020 [facility]  Lymphedema Life Impact Scale Total Score 7.35 %      Objective measurements completed on examination: See above findings.       [place] Adult PT Treatment/Exercise - 03/31/20 0001      Manual Therapy   Manual Therapy Edema management    Edema Management created chip pack for pt to wear in her sports bra to help reduce breast swelling                  PT Education - 03/31/20 1049    Education Details anatomy and physiology of lymphatic system, lymphedema risk reduction practices    Person(s) Educated Patient    Methods Explanation;Handout    Comprehension Verbalized understanding               PT Long Term Goals - 03/31/20 1049      PT LONG TERM GOAL #1   Title Pt will obtain appropriate compression garments for long term management of lymphedema    Time 4    Period Weeks    Status New    Target Date 04/28/20      PT LONG TERM GOAL #2   Title Pt will report a 50% decrease in R breast swelling to allow improved comfort.    Time 4    Period Weeks    Status New    Target Date 04/28/20      PT LONG TERM GOAL #3   Title Pt will be independent in self MLD for long term  management of lymphedema    Time 4    Period Weeks    Status New    Target Date 04/28/20      PT LONG TERM GOAL #4   Title Pt will have no tightness in right axilla at end range flexion to allow improved comfort.    Time 4    Period Weeks    Status New    Target Date 04/28/20                  Plan - 03/31/20 1052    Clinical Impression Statement Pt presents to PT with R breast lymphedema. She has a history of R breast cancer is is status post a R breast lumpectomy and SLNB on 02/04/19. Pt reports her breast swelling never really went away after surgery. She completed radiation. Pt's pore size is noticeably larger in R breast especially in inferior portion compared to left breast and the right breast is about a third  larger than the left. Pt has tightness at end range of shoulder flexion but overall her ROM is [facility]. Pt would benefit from skilled PT services to decrease tightness in right axilla and decrease R breast swelling. Pt also has some feelings of swelling in RUE though today there is no significant measureable different. WIll continue to assess this.    Stability/Clinical Decision Making Stable/Uncomplicated    Clinical Decision Making Low    Rehab Potential Good    PT Frequency 2x / week    PT Duration 4 weeks    PT Treatment/Interventions ADLs/Self Care Home Management;Therapeutic exercise;Patient/family education;Manual lymph drainage;Manual techniques;Compression bandaging;Scar mobilization;Passive range of motion;Vasopneumatic Device    PT Next Visit Plan begin MLD to R breast and eventually instruct pt, see if script was returned signed by dr for bra and proplylactic sleeve and glove, myofascial release R axilla    PT Home Exercise Plan wear chip pack in bra for additional compression    Recommended Other Services sent [facility] to [facility] to be signed for compression garments    Consulted and Agree with Plan of Care Patient           Patient will benefit from skilled  therapeutic intervention in order to improve the following deficits and impairments:  Increased edema, Decreased scar mobility  Visit Diagnosis: Lymphedema, not elsewhere classified  Abnormal posture     Problem List Patient Active Problem List   Diagnosis Date Noted   Genetic testing 08/07/2019   Family history of prostate cancer    Family history of uterine cancer    MTHFR gene mutation 01/16/2019   Epigastric pain 01/15/2019   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, estrogen receptor positive ([place]) 12/24/2018   Hypothyroid 11/11/2018   Memory change 11/13/2016   Common migraine with intractable migraine 04/06/2016   H/O partial thyroidectomy 01/14/2014   Family history of premature CAD 09/01/2013   History of migraine headaches 02/21/2013   Hyperlipidemia 02/21/2013   Thyroid function study abnormality 02/21/2013    Allyson Sabal [facility] 03/31/2020, 10:58 AM  [place] [place], [place], [address] Phone: [phone]   Fax:  [phone]  Name: Cadie Sorci MRN: [id number] Date of Birth: [birth year]  Manus Gunning, PT 03/31/20 11:00 AM

## 2020-04-01 ENCOUNTER — Encounter: Payer: Self-pay | Admitting: Family Medicine

## 2020-04-01 MED ORDER — LEVOTHYROXINE SODIUM 75 MCG PO TABS: 75.0000 ug | ORAL_TABLET | Freq: Every day | ORAL | 0 refills | Status: DC

## 2020-04-09 ENCOUNTER — Other Ambulatory Visit: Payer: Self-pay

## 2020-04-09 ENCOUNTER — Ambulatory Visit (INDEPENDENT_AMBULATORY_CARE_PROVIDER_SITE_OTHER): Payer: Medicare Other | Admitting: Family Medicine

## 2020-04-09 ENCOUNTER — Encounter: Payer: Self-pay | Admitting: Family Medicine

## 2020-04-09 VITALS — BP 100/70 | HR 72 | Temp 98.7°F | Resp 18 | Ht 60.0 in | Wt 114.6 lb

## 2020-04-09 DIAGNOSIS — G43809 Other migraine, not intractable, without status migrainosus: Secondary | ICD-10-CM

## 2020-04-09 DIAGNOSIS — M810 Age-related osteoporosis without current pathological fracture: Secondary | ICD-10-CM | POA: Diagnosis not present

## 2020-04-09 DIAGNOSIS — E039 Hypothyroidism, unspecified: Secondary | ICD-10-CM

## 2020-04-09 DIAGNOSIS — R748 Abnormal levels of other serum enzymes: Secondary | ICD-10-CM | POA: Diagnosis not present

## 2020-04-09 MED ORDER — NARATRIPTAN HCL 2.5 MG PO TABS: 2.5000 mg | ORAL_TABLET | ORAL | 1 refills | Status: DC | PRN

## 2020-04-09 NOTE — Patient Instructions (Signed)
Hypothyroidism  Hypothyroidism is when the thyroid gland does not make enough of certain hormones (it is underactive). The thyroid gland is a small gland located in the lower front part of the neck, just in front of the windpipe (trachea). This gland makes hormones that help control how the body uses food for energy (metabolism) as well as how the heart and brain function. These hormones also play a role in keeping your bones strong. When the thyroid is underactive, it produces too little of the hormones thyroxine (T4) and triiodothyronine (T3). What are the causes? This condition may be caused by:  Hashimoto's disease. This is a disease in which the body's disease-fighting system (immune system) attacks the thyroid gland. This is the most common cause.  Viral infections.  Pregnancy.  Certain medicines.  Birth defects.  Past radiation treatments to the head or neck for cancer.  Past treatment with radioactive iodine.  Past exposure to radiation in the environment.  Past surgical removal of part or all of the thyroid.  Problems with a gland in the center of the brain (pituitary gland).  Lack of enough iodine in the diet. What increases the risk? You are more likely to develop this condition if:  You are female.  You have a family history of thyroid conditions.  You use a medicine called lithium.  You take medicines that affect the immune system (immunosuppressants). What are the signs or symptoms? Symptoms of this condition include:  Feeling as though you have no energy (lethargy).  Not being able to tolerate cold.  Weight gain that is not explained by a change in diet or exercise habits.  Lack of appetite.  Dry skin.  Coarse hair.  Menstrual irregularity.  Slowing of thought processes.  Constipation.  Sadness or depression. How is this diagnosed? This condition may be diagnosed based on:  Your symptoms, your medical history, and a physical exam.  Blood  tests. You may also have imaging tests, such as an ultrasound or MRI. How is this treated? This condition is treated with medicine that replaces the thyroid hormones that your body does not make. After you begin treatment, it may take several weeks for symptoms to go away. Follow these instructions at home:  Take over-the-counter and prescription medicines only as told by your health care provider.  If you start taking any new medicines, tell your health care provider.  Keep all follow-up visits as told by your health care provider. This is important. ? As your condition improves, your dosage of thyroid hormone medicine may change. ? You will need to have blood tests regularly so that your health care provider can monitor your condition. Contact a health care provider if:  Your symptoms do not get better with treatment.  You are taking thyroid replacement medicine and you: ? Sweat a lot. ? Have tremors. ? Feel anxious. ? Lose weight rapidly. ? Cannot tolerate heat. ? Have emotional swings. ? Have diarrhea. ? Feel weak. Get help right away if you have:  Chest pain.  An irregular heartbeat.  A rapid heartbeat.  Difficulty breathing. Summary  Hypothyroidism is when the thyroid gland does not make enough of certain hormones (it is underactive).  When the thyroid is underactive, it produces too little of the hormones thyroxine (T4) and triiodothyronine (T3).  The most common cause is Hashimoto's disease, a disease in which the body's disease-fighting system (immune system) attacks the thyroid gland. The condition can also be caused by viral infections, medicine, pregnancy, or past   radiation treatment to the head or neck.  Symptoms may include weight gain, dry skin, constipation, feeling as though you do not have energy, and not being able to tolerate cold.  This condition is treated with medicine to replace the thyroid hormones that your body does not make. This information  is not intended to replace advice given to you by your health care provider. Make sure you discuss any questions you have with your health care provider. Document Revised: 05/25/2017 Document Reviewed: 05/23/2017 Elsevier Patient Education  2020 Elsevier Inc.  

## 2020-04-09 NOTE — Progress Notes (Signed)
Patient ID: Kimberly Miles, female    DOB: June 03, 1956  Age: 64 y.o. MRN: [id number]    Subjective:  Subjective  HPI Kimberly Miles presents for f/u thyroid.   She would also like to change the migraine med back to amerge.    Review of Systems  Constitutional: Negative for appetite change, diaphoresis, fatigue and unexpected weight change.  Eyes: Negative for pain, redness and visual disturbance.  Respiratory: Negative for cough, chest tightness, shortness of breath and wheezing.   Cardiovascular: Negative for chest pain, palpitations and leg swelling.  Endocrine: Negative for cold intolerance, heat intolerance, polydipsia, polyphagia and polyuria.  Genitourinary: Negative for difficulty urinating, dysuria and frequency.  Neurological: Negative for dizziness, light-headedness, numbness and headaches.    History Past Medical History:  Diagnosis Date  . Anxiety   . Arthritis   . Cancer ([place]) 11/2018   right breast cancer  . Common migraine with intractable migraine 04/06/2016  . Complication of anesthesia   . Diverticulosis   . Elevated LFTs    monitored by pcp per pt --  unknown etiology  . Family history of premature CAD 09/01/2013  . Family history of prostate cancer   . Family history of uterine cancer   . History of basal cell carcinoma excision    2004   &  2014  . History of colitis   . History of palpitations   . 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 IBS (irritable bowel syndrome)   . Migraine   . MTHFR gene mutation   . Personal history of radiation therapy   . PMB (postmenopausal bleeding)   . PONV (postoperative nausea and vomiting)   . Post-surgical hypothyroidism     She has a past surgical history that includes Colposcopy (05/2009); Cesarean section (1977 and 1978); Oophorectomy (Right, 1985); Appendectomy (as child); Breast biopsy (Left, 1979); Thyroid lobectomy  (Right, 03-08-2011); transthoracic echocardiogram (05-09-2011); Cardiovascular stress test (09-16-2013); NEGATIVE SLEEP STUDY (2014  per pt); Hysteroscopy with D & C (N/A, 12/23/2015); Breast excisional biopsy (Left); Tubal ligation; Breast lumpectomy with radioactive seed and sentinel lymph node biopsy (Right, 02/04/2019); Breast reduction surgery (Bilateral, 02/11/2019); Breast lumpectomy (Right, 01/2019); and Reduction mammaplasty (Bilateral, 01/2019).   Her family history includes Ehlers-Danlos syndrome in her mother; Fibromyalgia in her mother; Hyperlipidemia (age of onset: 28) in her mother; Hypertension in her maternal grandmother; Hypertension (age of onset: 38) in her mother; Kidney disease in her maternal aunt and mother; Lung cancer (age of onset: 46) in her mother; Lung cancer (age of onset: 37) in her maternal grandmother; Migraines in her father and paternal grandmother; Osteoporosis in her father; Other in her father; Prostate cancer (age of onset: 21) in her paternal grandfather; Stroke in her maternal grandfather; Uterine cancer (age of onset: 44) in her maternal grandmother; Vascular Disease in her maternal grandmother.She reports that she has never smoked. She has never used smokeless tobacco. She reports previous alcohol use. She reports that she does not use drugs.  Current Outpatient Medications on File Prior to Visit  Medication Sig Dispense Refill  . Ascorbic Acid (VITAMIN C) 1000 MG tablet Take 1,000 mg by mouth daily. 2 tablet daily    . ASTAXANTHIN PO Take by mouth.    . Cholecalciferol (VITAMIN D3) 5000 UNITS CAPS Take 1 capsule by mouth daily.    . cyclobenzaprine (FLEXERIL) 5 MG tablet Take 1 tablet by mouth daily  as needed.     . iodine-sodium iodide 2-2.4 % solution Apply topically once.     Marland Kitchen levothyroxine (EUTHYROX) 75 MCG tablet Take 1 tablet (75 mcg total) by mouth daily before breakfast. 30 tablet 0  . Melatonin 3 MG TABS Take 10 mg by mouth at bedtime.     . Multiple  Vitamins-Minerals (MULTIVITAMIN WITH MINERALS) tablet Take 1 tablet by mouth daily.    Marland Kitchen OVER THE COUNTER MEDICATION Take 325 mg by mouth daily. Magnesium oil 325 mg daily    . UNABLE TO FIND Med Name: magnesium spray, she makes herself from magnesium salts and water.  She sprays it on topically    . zinc gluconate 50 MG tablet Take 15 mg by mouth daily.     Marland Kitchen zolpidem (AMBIEN) 5 MG tablet Take 1 tablet (5 mg total) by mouth at bedtime as needed for sleep. 30 tablet 0  . letrozole (FEMARA) 2.5 MG tablet Take 1 tablet (2.5 mg total) by mouth daily. (Patient not taking: Reported on 04/09/2020) 90 tablet 3   No current facility-administered medications on file prior to visit.     Objective:  Objective  Physical Exam Vitals and nursing note reviewed.  Constitutional:      Appearance: She is well-developed.  HENT:     Head: Normocephalic and atraumatic.  Eyes:     Conjunctiva/sclera: Conjunctivae normal.  Neck:     Thyroid: No thyromegaly.     Vascular: No carotid bruit or JVD.  Cardiovascular:     Rate and Rhythm: Normal rate and regular rhythm.     Heart sounds: Normal heart sounds. No murmur heard.   Pulmonary:     Effort: Pulmonary effort is normal. No respiratory distress.     Breath sounds: Normal breath sounds. No wheezing or rales.  Chest:     Chest wall: No tenderness.  Musculoskeletal:     Cervical back: Normal range of motion and neck supple.  Neurological:     Mental Status: She is alert and oriented to person, place, and time.    BP 100/70 (BP Location: Right Arm, Patient Position: Sitting, Cuff Size: Normal)   Pulse 72   Temp 98.7 F (37.1 C) (Oral)   Resp 18   Ht 5' (1.524 m)   Wt 114 lb 9.6 oz (52 kg)   LMP 08/25/2010   SpO2 98%   BMI 22.38 kg/m  Wt Readings from Last 3 Encounters:  04/09/20 114 lb 9.6 oz (52 kg)  12/18/19 111 lb (50.3 kg)  10/03/19 113 lb (51.3 kg)     Lab Results  Component Value Date   WBC 5.9 03/22/2017   HGB 14.4 03/22/2017     HCT 43.3 03/22/2017   PLT 210.0 03/22/2017   GLUCOSE 84 10/03/2019   CHOL 244 (H) 10/03/2019   TRIG 94 10/03/2019   HDL 91 10/03/2019   LDLCALC 137 (H) 10/03/2019   ALT 41 (H) 10/03/2019   AST 33 10/03/2019   NA 142 10/03/2019   K 4.0 10/03/2019   CL 103 10/03/2019   CREATININE 0.58 10/03/2019   BUN 7 (L) 10/03/2019   CO2 25 10/03/2019   TSH 2.15 01/16/2019   INR 1.0 07/03/2014    MM DIAG BREAST TOMO BILATERAL  Result Date: 01/06/2020 CLINICAL DATA:  64 year old female with history of right breast cancer post lumpectomy and bilateral reduction mammoplasty 02/04/2019. EXAM: DIGITAL DIAGNOSTIC BILATERAL MAMMOGRAM WITH TOMO AND CAD COMPARISON:  Previous exams. ACR Breast Density Category c: The breast  tissue is heterogeneously dense, which may obscure small masses. FINDINGS: No suspicious masses or calcifications are seen in either breast. New lumpectomy changes are present within the central posterior right breast. Spot compression magnification mL view of the right breast lumpectomy site was performed. There is no mammographic evidence of locally recurrent malignancy. An initially questioned asymmetry in the upper-outer left breast resolves on the additional imaging with findings compatible with an area of overlapping fibroglandular tissue. There is no mammographic evidence of malignancy in either breast. Mammographic images were processed with CAD. IMPRESSION: New right breast lumpectomy site and postsurgical changes related to bilateral reduction mammoplasty. No mammographic evidence of malignancy in either breast. RECOMMENDATION: Diagnostic mammogram is suggested in 1 year. (Code:DM-B-01Y) I have discussed the findings and recommendations with the patient. If applicable, a reminder letter will be sent to the patient regarding the next appointment. BI-RADS CATEGORY  2: Benign. Electronically Signed   By: Everlean Alstrom M.D.   On: 01/06/2020 11:23     Assessment & Plan:  Plan  I have  discontinued Brendan Gruwell. Nielsen "Patty"'s rizatriptan. I am also having her start on naratriptan. Additionally, I am having her maintain her Vitamin D3, cyclobenzaprine, zolpidem, melatonin, vitamin C, zinc gluconate, iodine-sodium iodide, UNABLE TO FIND, multivitamin with minerals, ASTAXANTHIN PO, OVER THE COUNTER MEDICATION, letrozole, and levothyroxine.  Meds ordered this encounter  Medications  . naratriptan (AMERGE) 2.5 MG tablet    Sig: Take 1 tablet (2.5 mg total) by mouth as needed for migraine. Take one (1) tablet at onset of headache; if returns or does not resolve, may repeat after 4 hours; do not exceed five (5) mg in 24 hours.    Dispense:  10 tablet    Refill:  1    Problem List Items Addressed This Visit      Unprioritized   Age-related osteoporosis without current pathological fracture    Weight bearing exercise con't Ca and vita d Pt is refusing meds at this time       Relevant Orders   Vitamin D (25 hydroxy)   Hypothyroid - Primary    Check labs  con't synthroid       Relevant Orders   Thyroid Panel With TSH    Other Visit Diagnoses    Other migraine without status migrainosus, not intractable       Relevant Medications   naratriptan (AMERGE) 2.5 MG tablet   Elevated liver enzymes       Relevant Orders   Hepatic function panel      Follow-up: Return in about 6 months (around 10/08/2020), or if symptoms worsen or fail to improve, for thyroid / vita d and liver .  Ann Held, DO

## 2020-04-09 NOTE — Assessment & Plan Note (Signed)
Check labs con't synthroid 

## 2020-04-09 NOTE — Assessment & Plan Note (Signed)
Weight bearing exercise con't Ca and vita d Pt is refusing meds at this time

## 2020-04-10 LAB — THYROID PANEL WITH TSH
Free Thyroxine Index: 2.8 (ref 1.4–3.8)
T3 Uptake: 27 % (ref 22–35)
T4, Total: 10.5 ug/dL (ref 5.1–11.9)
TSH: 1.55 mIU/L (ref 0.40–4.50)

## 2020-04-10 LAB — HEPATIC FUNCTION PANEL
AG Ratio: 2.3 (calc) (ref 1.0–2.5)
ALT: 28 U/L (ref 6–29)
AST: 26 U/L (ref 10–35)
Albumin: 4.5 g/dL (ref 3.6–5.1)
Alkaline phosphatase (APISO): 110 U/L (ref 37–153)
Bilirubin, Direct: 0.1 mg/dL (ref 0.0–0.2)
Globulin: 2 g/dL (calc) (ref 1.9–3.7)
Indirect Bilirubin: 0.3 mg/dL (calc) (ref 0.2–1.2)
Total Bilirubin: 0.4 mg/dL (ref 0.2–1.2)
Total Protein: 6.5 g/dL (ref 6.1–8.1)

## 2020-04-10 LAB — VITAMIN D 25 HYDROXY (VIT D DEFICIENCY, FRACTURES): Vit D, 25-Hydroxy: 77 ng/mL (ref 30–100)

## 2020-04-13 ENCOUNTER — Ambulatory Visit: Payer: Medicare Other | Admitting: Physical Therapy

## 2020-04-13 ENCOUNTER — Other Ambulatory Visit: Payer: Self-pay

## 2020-04-13 ENCOUNTER — Encounter: Payer: Self-pay | Admitting: Physical Therapy

## 2020-04-13 DIAGNOSIS — I890 Lymphedema, not elsewhere classified: Secondary | ICD-10-CM | POA: Diagnosis not present

## 2020-04-13 DIAGNOSIS — R293 Abnormal posture: Secondary | ICD-10-CM | POA: Diagnosis not present

## 2020-04-13 NOTE — Therapy (Signed)
[place], [place], [address] Phone: [phone]   Fax:  [phone]  Physical Therapy Treatment  Patient Details  Name: Kimberly Miles MRN: [id number] Date of Birth: May 17, 1956 Referring Provider (PT): Barry Dienes   Encounter Date: 04/13/2020   PT End of Session - 04/13/20 1159    Visit Number 2    Number of Visits 9    Date for PT Re-Evaluation 04/28/20    PT Start Time 1103    PT Stop Time 1155    PT Time Calculation (min) 52 min    Activity Tolerance Patient tolerated treatment well    Behavior During Therapy [facility] for tasks assessed/performed           Past Medical History:  Diagnosis Date  . Anxiety   . Arthritis   . Cancer ([place]) 11/2018   right breast cancer  . Common migraine with intractable migraine 04/06/2016  . Complication of anesthesia   . Diverticulosis   . Elevated LFTs    monitored by pcp per pt --  unknown etiology  . Family history of premature CAD 09/01/2013  . Family history of prostate cancer   . Family history of uterine cancer   . History of basal cell carcinoma excision    2004   &  2014  . History of colitis   . History of palpitations   . 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 IBS (irritable bowel syndrome)   . Migraine   . MTHFR gene mutation   . Personal history of radiation therapy   . PMB (postmenopausal bleeding)   . PONV (postoperative nausea and vomiting)   . Post-surgical hypothyroidism     Past Surgical History:  Procedure Laterality Date  . APPENDECTOMY  as child  . BREAST BIOPSY Left 1979   benign  . BREAST EXCISIONAL BIOPSY Left   . BREAST LUMPECTOMY Right 01/2019  . BREAST LUMPECTOMY WITH RADIOACTIVE SEED AND SENTINEL LYMPH NODE BIOPSY Right 02/04/2019   Procedure: RIGHT BREAST LUMPECTOMY WITH RADIOACTIVE SEED AND RIGHT AXILLARY DEEP SENTINEL LYMPH NODE BIOPSY  WITH BLUE DYE INJECTION;  Surgeon: Fanny Skates, MD;  Location: [place];  Service: General;  Laterality: Right;  . BREAST REDUCTION SURGERY Bilateral 02/11/2019   Procedure: RIGHT BREAST ONCOPLASTIC RECONSTRUCTION, LEFT BREAST REDUCTION;  Surgeon: Irene Limbo, MD;  Location: [place];  Service: Plastics;  Laterality: Bilateral;  . CARDIOVASCULAR STRESS TEST  09-16-2013   normal nuclear study w/ no ischemia/  normal LV function and wall motion , ef 84%  . CESAREAN SECTION  1977 and 1978   Bilateral Tubal Ligation w/ last c/s  . COLPOSCOPY  05/2009   CIN 1  . HYSTEROSCOPY WITH D & C N/A 12/23/2015   Procedure: DILATATION AND CURETTAGE /HYSTEROSCOPY ;  Surgeon: Megan Salon, MD;  Location: [facility];  Service: Gynecology;  Laterality: N/A;  . NEGATIVE SLEEP STUDY  2014  per pt  . OOPHORECTOMY Right 1985   ruptured cyst   . REDUCTION MAMMAPLASTY Bilateral 01/2019  . THYROID LOBECTOMY Right 03-08-2011  . TRANSTHORACIC ECHOCARDIOGRAM  05-09-2011   ef 64%/  mild MR and TR  . TUBAL LIGATION      There were no vitals filed for this visit.   Subjective Assessment - 04/13/20 1107    Subjective I am not sure I noticed anything with  the chip pack. I did not really wear it. I go today for my fitting for the compression bra today.    Pertinent History R breast cancer, s/p lumpectomy and SLNB on 02/04/19, completed radiation, current taking anti estrogen pill    Patient Stated Goals to alleviate some of the soreness, tightness and size of R breast    Currently in Pain? No/denies                       Outpatient [facility]  Lymphedema Life Impact Scale Total Score 7.35 %            [facility] Adult PT Treatment/Exercise - 04/13/20 0001      Manual Therapy   Manual Therapy Manual Lymphatic Drainage (MLD);Soft tissue mobilization    Soft tissue mobilization to  inferior breast scar along area of fibrosis    Manual Lymphatic Drainage (MLD) short neck, 5 diaphragmatic breaths, left axillary nodes and establishment of interaxillary pathway, right inguinal nodes and establishment of axillo inguinal pathway, R breast moving fluid towards pathways then retracing all steps                       PT Long Term Goals - 03/31/20 1049      PT LONG TERM GOAL #1   Title Pt will obtain appropriate compression garments for long term management of lymphedema    Time 4    Period Weeks    Status New    Target Date 04/28/20      PT LONG TERM GOAL #2   Title Pt will report a 50% decrease in R breast swelling to allow improved comfort.    Time 4    Period Weeks    Status New    Target Date 04/28/20      PT LONG TERM GOAL #3   Title Pt will be independent in self MLD for long term management of lymphedema    Time 4    Period Weeks    Status New    Target Date 04/28/20      PT LONG TERM GOAL #4   Title Pt will have no tightness in right axilla at end range flexion to allow improved comfort.    Time 4    Period Weeks    Status New    Target Date 04/28/20                 Plan - 04/13/20 1157    Clinical Impression Statement Began MLD to R breast while instructing pt in basic principles of lymphatic system. Spent extra time on soft tissue mobilization to fibrotic area at scar line on inferior breast with signficant softening noted by end of session. Will educate pt on correct technique next session. Pt is going to be measured for a sleeve, glove and bra today.    PT Frequency 2x / week    PT Duration 4 weeks    PT Treatment/Interventions ADLs/Self Care Home Management;Therapeutic exercise;Patient/family education;Manual lymph drainage;Manual techniques;Compression bandaging;Scar mobilization;Passive range of motion;Vasopneumatic Device    PT Next Visit Plan instruct pt in MLD and issue handout, MFR R axilla, STM To inferior breast scar-  see how compression bra is working    PT Home Exercise Plan wear chip pack in bra for additional compression    Consulted and Agree with Plan of Care Patient  Patient will benefit from skilled therapeutic intervention in order to improve the following deficits and impairments:  Increased edema, Decreased scar mobility  Visit Diagnosis: Lymphedema, not elsewhere classified     Problem List Patient Active Problem List   Diagnosis Date Noted  . Age-related osteoporosis without current pathological fracture 04/09/2020  . Genetic testing 08/07/2019  . Family history of prostate cancer   . Family history of uterine cancer   . MTHFR gene mutation 01/16/2019  . Epigastric pain 01/15/2019  . 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 Hypothyroid 11/11/2018  . Memory change 11/13/2016  . Common migraine with intractable migraine 04/06/2016  . H/O partial thyroidectomy 01/14/2014  . Family history of premature CAD 09/01/2013  . History of migraine headaches 02/21/2013  . Hyperlipidemia 02/21/2013  . Thyroid function study abnormality 02/21/2013    Allyson Sabal [facility] 04/13/2020, 12:00 PM  [place], [place], [address] Phone: [phone]   Fax:  [phone]  Name: Jersi Mcmaster MRN: [id number] Date of Birth: February 14, 1956  Manus Gunning, PT 04/13/20 12:01 PM

## 2020-04-20 ENCOUNTER — Other Ambulatory Visit: Payer: Self-pay

## 2020-04-20 ENCOUNTER — Encounter: Payer: Self-pay | Admitting: Physical Therapy

## 2020-04-20 ENCOUNTER — Ambulatory Visit: Payer: Medicare Other | Admitting: Physical Therapy

## 2020-04-20 DIAGNOSIS — I890 Lymphedema, not elsewhere classified: Secondary | ICD-10-CM | POA: Diagnosis not present

## 2020-04-20 DIAGNOSIS — R293 Abnormal posture: Secondary | ICD-10-CM | POA: Diagnosis not present

## 2020-04-20 NOTE — Therapy (Signed)
[place], [place], [address] Phone: [phone]   Fax:  [phone]  Physical Therapy Treatment  Patient Details  Name: Kimberly Miles MRN: [id number] Date of Birth: 1956/03/18 Referring Provider (PT): Barry Dienes   Encounter Date: 04/20/2020   PT End of Session - 04/20/20 1203    Visit Number 3    Number of Visits 9    Date for PT Re-Evaluation 04/28/20    PT Start Time 1105    PT Stop Time 1157    PT Time Calculation (min) 52 min    Activity Tolerance Patient tolerated treatment well    Behavior During Therapy [facility] for tasks assessed/performed           Past Medical History:  Diagnosis Date   Anxiety    Arthritis    Cancer ([place]) 11/2018   right breast cancer   Common migraine with intractable migraine 10/62/6948   Complication of anesthesia    Diverticulosis    Elevated LFTs    monitored by pcp per pt --  unknown etiology   Family history of premature CAD 09/01/2013   Family history of prostate cancer    Family history of uterine cancer    History of basal cell carcinoma excision    2004   &  2014   History of colitis    History of palpitations    History of thyroid nodule    s/p  right thyroid lobectomy 03-08-2011  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   IBS (irritable bowel syndrome)    Migraine    MTHFR gene mutation    Personal history of radiation therapy    PMB (postmenopausal bleeding)    PONV (postoperative nausea and vomiting)    Post-surgical hypothyroidism     Past Surgical History:  Procedure Laterality Date   APPENDECTOMY  as child   BREAST BIOPSY Left 1979   benign   BREAST EXCISIONAL BIOPSY Left    BREAST LUMPECTOMY Right 01/2019   BREAST LUMPECTOMY WITH RADIOACTIVE SEED AND SENTINEL LYMPH NODE BIOPSY Right 02/04/2019   Procedure: RIGHT BREAST LUMPECTOMY WITH RADIOACTIVE SEED AND RIGHT AXILLARY DEEP SENTINEL LYMPH NODE BIOPSY  WITH BLUE DYE INJECTION;  Surgeon: Fanny Skates, MD;  Location: [place];  Service: General;  Laterality: Right;   BREAST REDUCTION SURGERY Bilateral 02/11/2019   Procedure: RIGHT BREAST ONCOPLASTIC RECONSTRUCTION, LEFT BREAST REDUCTION;  Surgeon: Irene Limbo, MD;  Location: [place];  Service: Plastics;  Laterality: Bilateral;   CARDIOVASCULAR STRESS TEST  09-16-2013   normal nuclear study w/ no ischemia/  normal LV function and wall motion , ef 84%   CESAREAN SECTION  1977 and 1978   Bilateral Tubal Ligation w/ last c/s   COLPOSCOPY  05/2009   CIN 1   HYSTEROSCOPY WITH D & C N/A 12/23/2015   Procedure: DILATATION AND CURETTAGE /HYSTEROSCOPY ;  Surgeon: Megan Salon, MD;  Location: [facility];  Service: Gynecology;  Laterality: N/A;   NEGATIVE SLEEP STUDY  2014  per pt   OOPHORECTOMY Right 1985   ruptured cyst    REDUCTION MAMMAPLASTY Bilateral 01/2019   THYROID LOBECTOMY Right 03-08-2011   TRANSTHORACIC ECHOCARDIOGRAM  05-09-2011   ef 64%/  mild MR and TR   TUBAL LIGATION      There were no vitals filed for this visit.   Subjective Assessment - 04/20/20 1107    Subjective I have been wearing my compression bra for  a week and I can not tell any difference yet. I am getting used to them.    Pertinent History R breast cancer, s/p lumpectomy and SLNB on 02/04/19, completed radiation, current taking anti estrogen pill    Patient Stated Goals to alleviate some of the soreness, tightness and size of R breast    Currently in Pain? No/denies                       Outpatient Re[facility]31/[facility]  Lymphedema Life Impact Scale Total Score 7.35 %            [facility] Adult PT Treatment/Exercise - 04/20/20 0001      Manual Therapy   Edema Management assessed compression bra for proper fit    Soft tissue mobilization to inferior breast scar along area of  fibrosis    Manual Lymphatic Drainage (MLD) instructed pt throughout and had her return demonstrate correct technique as follows: short neck, 5 diaphragmatic breaths, left axillary nodes and establishment of interaxillary pathway, right inguinal nodes and establishment of axillo inguinal pathway, R breast moving fluid towards pathways then retracing all steps- pt required min to mod verbal cues for correct skin stretch, pt performed first half of massage and therapist performed second half                       PT Long Term Goals - 03/31/20 1049      PT LONG TERM GOAL #1   Title Pt will obtain appropriate compression garments for long term management of lymphedema    Time 4    Period Weeks    Status New    Target Date 04/28/20      PT LONG TERM GOAL #2   Title Pt will report a 50% decrease in R breast swelling to allow improved comfort.    Time 4    Period Weeks    Status New    Target Date 04/28/20      PT LONG TERM GOAL #3   Title Pt will be independent in self MLD for long term management of lymphedema    Time 4    Period Weeks    Status New    Target Date 04/28/20      PT LONG TERM GOAL #4   Title Pt will have no tightness in right axilla at end range flexion to allow improved comfort.    Time 4    Period Weeks    Status New    Target Date 04/28/20                 Plan - 04/20/20 1204    Clinical Impression Statement Instructed pt in self MLD and had pt return demonstrate technique today with min to mod verbal cues. Issued handout and educated pt to perform massage at least once a day and apply her compression bra afterwards. Overall her right breast is appearing less swollen as her pore size has decreased. She still has some fullness at medial and lateral breast when compared to left side.    PT Frequency 2x / week    PT Duration 4 weeks    PT Treatment/Interventions ADLs/Self Care Home Management;Therapeutic exercise;Patient/family education;Manual  lymph drainage;Manual techniques;Compression bandaging;Scar mobilization;Passive range of motion;Vasopneumatic Device    PT Next Visit Plan assess indep with self MLD, MFR R axilla, STM To inferior breast scar- see how compression bra is working  PT Home Exercise Plan wear chip pack in bra for additional compression    Consulted and Agree with Plan of Care Patient           Patient will benefit from skilled therapeutic intervention in order to improve the following deficits and impairments:  Increased edema, Decreased scar mobility  Visit Diagnosis: Lymphedema, not elsewhere classified     Problem List Patient Active Problem List   Diagnosis Date Noted   Age-related osteoporosis without current pathological fracture 04/09/2020   Genetic testing 08/07/2019   Family history of prostate cancer    Family history of uterine cancer    MTHFR gene mutation 01/16/2019   Epigastric pain 01/15/2019   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, estrogen receptor positive ([place]) 12/24/2018   Hypothyroid 11/11/2018   Memory change 11/13/2016   Common migraine with intractable migraine 04/06/2016   H/O partial thyroidectomy 01/14/2014   Family history of premature CAD 09/01/2013   History of migraine headaches 02/21/2013   Hyperlipidemia 02/21/2013   Thyroid function study abnormality 02/21/2013    Allyson Sabal [facility] 04/20/2020, 12:07 PM  [place], [place], [address] Phone: [phone]   Fax:  [phone]  Name: Kimberly Miles MRN: [id number] Date of Birth: 12-10-1955  Manus Gunning, PT 04/20/20 12:07 PM

## 2020-04-20 NOTE — Patient Instructions (Signed)
Self manual lymph drainage: Perform this sequence once a day.  Only give enough pressure no your skin to make the skin move.  Diaphragmatic - Supine   Inhale through nose making navel move out toward hands. Exhale through puckered lips, hands follow navel in. Repeat _5__ times. Rest _10__ seconds between repeats.   Copyright  VHI. All rights reserved.  Hug yourself.  Do circles at your neck just above your collarbones.  Repeat this 10 times.  Axilla - One at a Time   Using full weight of flat hand and fingers at center of uninvolved armpit, make _10__ in-place circles.   Copyright  VHI. All rights reserved.  LEG: Inguinal Nodes Stimulation   With small finger side of hand against hip crease on involved side, gently perform circles at the crease. Repeat __10_ times.   Copyright  VHI. All rights reserved.  1) Axilla to Inguinal Nodes - Sweep   On involved side, sweep _4__ times from armpit along side of trunk to hip crease.  Now gently stretch skin from the involved side to the uninvolved side across the chest at the shoulder line.  Repeat that 4 times.  Draw an imaginary diagonal line from upper outer breast through the nipple area toward lower inner breast.  Direct fluid upward and inward from this line toward the pathway across your upper chest .  Do this in three rows to treat all of the upper inner breast tissue, and do each row 3-4x.      Direct fluid to treat all of lower outer breast tissue downward and outward toward pathway that is aimed at the right groin.  Finish by doing the pathways as described above going from your involved armpit to the same side groin and going across your upper chest from the involved shoulder to the uninvolved shoulder.  Repeat the steps above where you do circles in your right groin and left armpit. Copyright  VHI. All rights reserved.  

## 2020-04-22 ENCOUNTER — Encounter: Payer: Self-pay | Admitting: Physical Therapy

## 2020-04-22 ENCOUNTER — Other Ambulatory Visit: Payer: Self-pay

## 2020-04-22 ENCOUNTER — Ambulatory Visit: Payer: Medicare Other | Admitting: Physical Therapy

## 2020-04-22 DIAGNOSIS — R293 Abnormal posture: Secondary | ICD-10-CM | POA: Diagnosis not present

## 2020-04-22 DIAGNOSIS — I890 Lymphedema, not elsewhere classified: Secondary | ICD-10-CM | POA: Diagnosis not present

## 2020-04-22 NOTE — Therapy (Signed)
[place], [place], [address] Phone: [phone]   Fax:  [phone]  Physical Therapy Treatment  Patient Details  Name: Kimberly Miles MRN: [id number] Date of Birth: 05/17/1956 Referring Provider (PT): Barry Dienes   Encounter Date: 04/22/2020   PT End of Session - 04/22/20 1600    Visit Number 4    Number of Visits 9    Date for PT Re-Evaluation 04/28/20    PT Start Time 1509    PT Stop Time 1556    PT Time Calculation (min) 47 min    Activity Tolerance Patient tolerated treatment well    Behavior During Therapy [facility] for tasks assessed/performed           Past Medical History:  Diagnosis Date  . Anxiety   . Arthritis   . Cancer ([place]) 11/2018   right breast cancer  . Common migraine with intractable migraine 04/06/2016  . Complication of anesthesia   . Diverticulosis   . Elevated LFTs    monitored by pcp per pt --  unknown etiology  . Family history of premature CAD 09/01/2013  . Family history of prostate cancer   . Family history of uterine cancer   . History of basal cell carcinoma excision    2004   &  2014  . History of colitis   . History of palpitations   . 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 IBS (irritable bowel syndrome)   . Migraine   . MTHFR gene mutation   . Personal history of radiation therapy   . PMB (postmenopausal bleeding)   . PONV (postoperative nausea and vomiting)   . Post-surgical hypothyroidism     Past Surgical History:  Procedure Laterality Date  . APPENDECTOMY  as child  . BREAST BIOPSY Left 1979   benign  . BREAST EXCISIONAL BIOPSY Left   . BREAST LUMPECTOMY Right 01/2019  . BREAST LUMPECTOMY WITH RADIOACTIVE SEED AND SENTINEL LYMPH NODE BIOPSY Right 02/04/2019   Procedure: RIGHT BREAST LUMPECTOMY WITH RADIOACTIVE SEED AND RIGHT AXILLARY DEEP SENTINEL LYMPH NODE BIOPSY  WITH BLUE DYE INJECTION;  Surgeon: Fanny Skates, MD;  Location: [place];  Service: General;  Laterality: Right;  . BREAST REDUCTION SURGERY Bilateral 02/11/2019   Procedure: RIGHT BREAST ONCOPLASTIC RECONSTRUCTION, LEFT BREAST REDUCTION;  Surgeon: Irene Limbo, MD;  Location: [place];  Service: Plastics;  Laterality: Bilateral;  . CARDIOVASCULAR STRESS TEST  09-16-2013   normal nuclear study w/ no ischemia/  normal LV function and wall motion , ef 84%  . CESAREAN SECTION  1977 and 1978   Bilateral Tubal Ligation w/ last c/s  . COLPOSCOPY  05/2009   CIN 1  . HYSTEROSCOPY WITH D & C N/A 12/23/2015   Procedure: DILATATION AND CURETTAGE /HYSTEROSCOPY ;  Surgeon: Megan Salon, MD;  Location: [facility];  Service: Gynecology;  Laterality: N/A;  . NEGATIVE SLEEP STUDY  2014  per pt  . OOPHORECTOMY Right 1985   ruptured cyst   . REDUCTION MAMMAPLASTY Bilateral 01/2019  . THYROID LOBECTOMY Right 03-08-2011  . TRANSTHORACIC ECHOCARDIOGRAM  05-09-2011   ef 64%/  mild MR and TR  . TUBAL LIGATION      There were no vitals filed for this visit.   Subjective Assessment - 04/22/20 1509    Subjective I did not have time to check my  swelling this morning because I had an 8:30 appt. When I looked last night the right side was still larger.    Pertinent History R breast cancer, s/p lumpectomy and SLNB on 02/04/19, completed radiation, current taking anti estrogen pill    Patient Stated Goals to alleviate some of the soreness, tightness and size of R breast    Currently in Pain? No/denies                       Outpatient [facility]  Lymphedema Life Impact Scale Total Score 7.35 %            [facility] Adult PT Treatment/Exercise - 04/22/20 0001      Manual Therapy   Edema Management assessed compression bra for proper fit    Soft tissue mobilization to inferior breast  scar along area of fibrosis    Manual Lymphatic Drainage (MLD) short neck, 5 diaphragmatic breaths, left axillary nodes and establishment of interaxillary pathway, right inguinal nodes and establishment of axillo inguinal pathway, R breast moving fluid towards pathways then retracing all steps- verbally reviewed sequence with pt                        PT Long Term Goals - 03/31/20 1049      PT LONG TERM GOAL #1   Title Pt will obtain appropriate compression garments for long term management of lymphedema    Time 4    Period Weeks    Status New    Target Date 04/28/20      PT LONG TERM GOAL #2   Title Pt will report a 50% decrease in R breast swelling to allow improved comfort.    Time 4    Period Weeks    Status New    Target Date 04/28/20      PT LONG TERM GOAL #3   Title Pt will be independent in self MLD for long term management of lymphedema    Time 4    Period Weeks    Status New    Target Date 04/28/20      PT LONG TERM GOAL #4   Title Pt will have no tightness in right axilla at end range flexion to allow improved comfort.    Time 4    Period Weeks    Status New    Target Date 04/28/20                 Plan - 04/22/20 1600    Clinical Impression Statement Reviewed self MLD with pt including correct sequence and answered pt's questions. Assessed her other compression bra for proper fit. Pt had increased fibrosis surrounding her scar today. Continued with manual therapy to reduce this and MLD to R breast to reduce swelling.    PT Frequency 2x / week    PT Duration 4 weeks    PT Treatment/Interventions ADLs/Self Care Home Management;Therapeutic exercise;Patient/family education;Manual lymph drainage;Manual techniques;Compression bandaging;Scar mobilization;Passive range of motion;Vasopneumatic Device    PT Next Visit Plan assess indep with self MLD, MFR R axilla, STM To inferior breast scar- see how compression bra is working    PT Home Exercise Plan  wear chip pack in bra for additional compression    Consulted and Agree with Plan of Care Patient           Patient will benefit from skilled therapeutic intervention in order to improve the  following deficits and impairments:  Increased edema, Decreased scar mobility  Visit Diagnosis: Lymphedema, not elsewhere classified     Problem List Patient Active Problem List   Diagnosis Date Noted  . Age-related osteoporosis without current pathological fracture 04/09/2020  . Genetic testing 08/07/2019  . Family history of prostate cancer   . Family history of uterine cancer   . MTHFR gene mutation 01/16/2019  . Epigastric pain 01/15/2019  . 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 Hypothyroid 11/11/2018  . Memory change 11/13/2016  . Common migraine with intractable migraine 04/06/2016  . H/O partial thyroidectomy 01/14/2014  . Family history of premature CAD 09/01/2013  . History of migraine headaches 02/21/2013  . Hyperlipidemia 02/21/2013  . Thyroid function study abnormality 02/21/2013    Allyson Sabal [facility] 04/22/2020, 4:02 PM  [place], [place], [address] Phone: [phone]   Fax:  [phone]  Name: Arturo Freundlich MRN: [id number] Date of Birth: 03-15-1956  Manus Gunning, PT 04/22/20 4:02 PM

## 2020-04-27 ENCOUNTER — Other Ambulatory Visit: Payer: Self-pay

## 2020-04-27 ENCOUNTER — Ambulatory Visit: Payer: Medicare Other | Attending: General Surgery

## 2020-04-27 DIAGNOSIS — I890 Lymphedema, not elsewhere classified: Secondary | ICD-10-CM | POA: Diagnosis not present

## 2020-04-27 DIAGNOSIS — R293 Abnormal posture: Secondary | ICD-10-CM

## 2020-04-27 NOTE — Therapy (Signed)
[place], [place], [address] Phone: [phone]   Fax:  [phone]  Physical Therapy Treatment  Patient Details  Name: Kimberly Miles MRN: [id number] Date of Birth: February 19, 1956 Referring Provider (PT): Barry Dienes   Encounter Date: 04/27/2020   PT End of Session - 04/27/20 1228    Visit Number 5    Number of Visits 9    Date for PT Re-Evaluation 04/28/20    PT Start Time 1107    PT Stop Time 1215    PT Time Calculation (min) 68 min    Activity Tolerance Patient tolerated treatment well    Behavior During Therapy [facility] for tasks assessed/performed           Past Medical History:  Diagnosis Date  . Anxiety   . Arthritis   . Cancer ([place]) 11/2018   right breast cancer  . Common migraine with intractable migraine 04/06/2016  . Complication of anesthesia   . Diverticulosis   . Elevated LFTs    monitored by pcp per pt --  unknown etiology  . Family history of premature CAD 09/01/2013  . Family history of prostate cancer   . Family history of uterine cancer   . History of basal cell carcinoma excision    2004   &  2014  . History of colitis   . History of palpitations   . 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 IBS (irritable bowel syndrome)   . Migraine   . MTHFR gene mutation   . Personal history of radiation therapy   . PMB (postmenopausal bleeding)   . PONV (postoperative nausea and vomiting)   . Post-surgical hypothyroidism     Past Surgical History:  Procedure Laterality Date  . APPENDECTOMY  as child  . BREAST BIOPSY Left 1979   benign  . BREAST EXCISIONAL BIOPSY Left   . BREAST LUMPECTOMY Right 01/2019  . BREAST LUMPECTOMY WITH RADIOACTIVE SEED AND SENTINEL LYMPH NODE BIOPSY Right 02/04/2019   Procedure: RIGHT BREAST LUMPECTOMY WITH RADIOACTIVE SEED AND RIGHT AXILLARY DEEP SENTINEL LYMPH NODE BIOPSY  WITH BLUE DYE INJECTION;  Surgeon: Fanny Skates, MD;  Location: [place];  Service: General;  Laterality: Right;  . BREAST REDUCTION SURGERY Bilateral 02/11/2019   Procedure: RIGHT BREAST ONCOPLASTIC RECONSTRUCTION, LEFT BREAST REDUCTION;  Surgeon: Irene Limbo, MD;  Location: [place];  Service: Plastics;  Laterality: Bilateral;  . CARDIOVASCULAR STRESS TEST  09-16-2013   normal nuclear study w/ no ischemia/  normal LV function and wall motion , ef 84%  . CESAREAN SECTION  1977 and 1978   Bilateral Tubal Ligation w/ last c/s  . COLPOSCOPY  05/2009   CIN 1  . HYSTEROSCOPY WITH D & C N/A 12/23/2015   Procedure: DILATATION AND CURETTAGE /HYSTEROSCOPY ;  Surgeon: Megan Salon, MD;  Location: [facility];  Service: Gynecology;  Laterality: N/A;  . NEGATIVE SLEEP STUDY  2014  per pt  . OOPHORECTOMY Right 1985   ruptured cyst   . REDUCTION MAMMAPLASTY Bilateral 01/2019  . THYROID LOBECTOMY Right 03-08-2011  . TRANSTHORACIC ECHOCARDIOGRAM  05-09-2011   ef 64%/  mild MR and TR  . TUBAL LIGATION      There were no vitals filed for this visit.   Subjective Assessment - 04/27/20 1121    Subjective I've been doing the self MLD daily, and  sometimes 2x/day. I've been wearing my compression bra daily but not at night.    Pertinent History R breast cancer, s/p lumpectomy and SLNB on 02/04/19, completed radiation, current taking anti estrogen pill    Patient Stated Goals to alleviate some of the soreness, tightness and size of R breast    Currently in Pain? No/denies                       [facility]patient [facility]  Lymphedema Life Impact Scale Total Score 7.35 %            [facility] Adult PT Treatment/Exercise - 04/27/20 0001      Self-Care   Self-Care Other Self-Care Comments    Other Self-Care Comments  Answered pts questions about progression of therapy and that goal is  to D/C typically once she feels confident in self MLD and understanding the "why" of performing this daily, along with continuing to wear compression bra and use of chip pack. Also continued with review of anatomy of lymphatic system and basics principles of MLD and how this works to decreasing lymphedema in breast. Also educated her on how to manage symptoms better in future, like when she has a more active day to be sure to plan self MLD that night after activity. Pt verbalized understanding.       Manual Therapy   Soft tissue mobilization to inferior breast scar along area of fibrosis    Manual Lymphatic Drainage (MLD) short neck, 5 diaphragmatic breaths, left axillary nodes, Lt intact upper quadrant and establishment of interaxillary pathway, right inguinal nodes and establishment of axillo inguinal pathway, R breast moving fluid towards pathways then retracing all steps- verbally reviewed sequence with pt                   PT Education - 04/27/20 1227    Education Details Reviewed anatomy and physiology of lymphatic system and basic principles of MLD while performing today    Person(s) Educated Patient    Methods Explanation;Demonstration    Comprehension Verbalized understanding               PT Long Term Goals - 03/31/20 1049      PT LONG TERM GOAL #1   Title Pt will obtain appropriate compression garments for long term management of lymphedema    Time 4    Period Weeks    Status New    Target Date 04/28/20      PT LONG TERM GOAL #2   Title Pt will report a 50% decrease in R breast swelling to allow improved comfort.    Time 4    Period Weeks    Status New    Target Date 04/28/20      PT LONG TERM GOAL #3   Title Pt will be independent in self MLD for long term management of lymphedema    Time 4    Period Weeks    Status New    Target Date 04/28/20      PT LONG TERM GOAL #4   Title Pt will have no tightness in right axilla at end range flexion to allow  improved comfort.    Time 4    Period Weeks    Status New    Target Date 04/28/20                 Plan - 04/27/20 1228    Clinical  Impression Statement Continued with manual lymph drainage of Rt breast while reviewing technique with pt. Also discussed importance of continuing to wear her compression bra daily, also to try at least wearing sports bra with chip pack at night as she repors doesn't wear the compression bra to have a break from the compression. Pt had alot of good questions about progression of therapy and how long do we typically treat until results gained. Informed her that we typically treat pts with breast lymphedema until they feel well educated about diagnosis, treatment (self MLD and use of compression) and self management of symptoms. Pt was very encouraged by all information today. May want to return for one more session, after next one on 11/4, in about 3 weeks for final assess of breast lymphedema and self MLD technique.    Stability/Clinical Decision Making Stable/Uncomplicated    Rehab Potential Good    PT Frequency 2x / week    PT Duration 4 weeks    PT Treatment/Interventions ADLs/Self Care Home Management;Therapeutic exercise;Patient/family education;Manual lymph drainage;Manual techniques;Compression bandaging;Scar mobilization;Passive range of motion;Vasopneumatic Device    PT Next Visit Plan Pt may want to schedule 1 more visit in about 3 weeks for final assess after next; reassess goals; assess indep with self MLD, MFR R axilla, STM To inferior breast scar    PT Home Exercise Plan wear chip pack in bra for additional compression; daily self MLD    Consulted and Agree with Plan of Care Patient           Patient will benefit from skilled therapeutic intervention in order to improve the following deficits and impairments:  Increased edema, Decreased scar mobility  Visit Diagnosis: Lymphedema, not elsewhere classified  Abnormal posture     Problem  List Patient Active Problem List   Diagnosis Date Noted  . Age-related osteoporosis without current pathological fracture 04/09/2020  . Genetic testing 08/07/2019  . Family history of prostate cancer   . Family history of uterine cancer   . MTHFR gene mutation 01/16/2019  . Epigastric pain 01/15/2019  . 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 Hypothyroid 11/11/2018  . Memory change 11/13/2016  . Common migraine with intractable migraine 04/06/2016  . H/O partial thyroidectomy 01/14/2014  . Family history of premature CAD 09/01/2013  . History of migraine headaches 02/21/2013  . Hyperlipidemia 02/21/2013  . Thyroid function study abnormality 02/21/2013    Otelia Limes, PTA 04/27/2020, 12:44 PM  [place], [place], [address] Phone: [phone]   Fax:  [phone]  Name: Kimberly Miles MRN: [id number] Date of Birth: [birth year]

## 2020-04-29 ENCOUNTER — Other Ambulatory Visit: Payer: Self-pay

## 2020-04-29 ENCOUNTER — Ambulatory Visit: Payer: Medicare Other | Admitting: Physical Therapy

## 2020-04-29 ENCOUNTER — Encounter: Payer: Self-pay | Admitting: Physical Therapy

## 2020-04-29 DIAGNOSIS — R293 Abnormal posture: Secondary | ICD-10-CM | POA: Diagnosis not present

## 2020-04-29 DIAGNOSIS — I890 Lymphedema, not elsewhere classified: Secondary | ICD-10-CM | POA: Diagnosis not present

## 2020-04-29 NOTE — Therapy (Signed)
[place], [place], [address] Phone: [phone]   Fax:  [phone]  Physical Therapy Treatment  Patient Details  Name: Kimberly Miles MRN: [id number] Date of Birth: 1956-01-16 Referring Provider (PT): Barry Dienes   Encounter Date: 04/29/2020   PT End of Session - 04/29/20 5277    Visit Number 6    Number of Visits 9    Date for PT Re-Evaluation 05/27/20    PT Start Time 1404    PT Stop Time 1453    PT Time Calculation (min) 49 min    Activity Tolerance Patient tolerated treatment well    Behavior During Therapy [facility] for tasks assessed/performed           Past Medical History:  Diagnosis Date  . Anxiety   . Arthritis   . Cancer ([place]) 11/2018   right breast cancer  . Common migraine with intractable migraine 04/06/2016  . Complication of anesthesia   . Diverticulosis   . Elevated LFTs    monitored by pcp per pt --  unknown etiology  . Family history of premature CAD 09/01/2013  . Family history of prostate cancer   . Family history of uterine cancer   . History of basal cell carcinoma excision    2004   &  2014  . History of colitis   . History of palpitations   . 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 IBS (irritable bowel syndrome)   . Migraine   . MTHFR gene mutation   . Personal history of radiation therapy   . PMB (postmenopausal bleeding)   . PONV (postoperative nausea and vomiting)   . Post-surgical hypothyroidism     Past Surgical History:  Procedure Laterality Date  . APPENDECTOMY  as child  . BREAST BIOPSY Left 1979   benign  . BREAST EXCISIONAL BIOPSY Left   . BREAST LUMPECTOMY Right 01/2019  . BREAST LUMPECTOMY WITH RADIOACTIVE SEED AND SENTINEL LYMPH NODE BIOPSY Right 02/04/2019   Procedure: RIGHT BREAST LUMPECTOMY WITH RADIOACTIVE SEED AND RIGHT AXILLARY DEEP SENTINEL LYMPH NODE BIOPSY  WITH BLUE DYE INJECTION;  Surgeon: Fanny Skates, MD;  Location: [place];  Service: General;  Laterality: Right;  . BREAST REDUCTION SURGERY Bilateral 02/11/2019   Procedure: RIGHT BREAST ONCOPLASTIC RECONSTRUCTION, LEFT BREAST REDUCTION;  Surgeon: Irene Limbo, MD;  Location: [place];  Service: Plastics;  Laterality: Bilateral;  . CARDIOVASCULAR STRESS TEST  09-16-2013   normal nuclear study w/ no ischemia/  normal LV function and wall motion , ef 84%  . CESAREAN SECTION  1977 and 1978   Bilateral Tubal Ligation w/ last c/s  . COLPOSCOPY  05/2009   CIN 1  . HYSTEROSCOPY WITH D & C N/A 12/23/2015   Procedure: DILATATION AND CURETTAGE /HYSTEROSCOPY ;  Surgeon: Megan Salon, MD;  Location: [facility];  Service: Gynecology;  Laterality: N/A;  . NEGATIVE SLEEP STUDY  2014  per pt  . OOPHORECTOMY Right 1985   ruptured cyst   . REDUCTION MAMMAPLASTY Bilateral 01/2019  . THYROID LOBECTOMY Right 03-08-2011  . TRANSTHORACIC ECHOCARDIOGRAM  05-09-2011   ef 64%/  mild MR and TR  . TUBAL LIGATION      There were no vitals filed for this visit.   Subjective Assessment - 04/29/20 1406    Subjective I would like for this to be the  last day and maybe 4 weeks out I can come back just to make sure it is ok.    Pertinent History R breast cancer, s/p lumpectomy and SLNB on 02/04/19, completed radiation, current taking anti estrogen pill    Patient Stated Goals to alleviate some of the soreness, tightness and size of R breast    Currently in Pain? No/denies                       [facility]patient [facility]  Lymphedema Life Impact Scale Total Score 7.35 %            [facility] Adult PT Treatment/Exercise - 04/29/20 0001      Manual Therapy   Manual Therapy Manual Lymphatic Drainage (MLD);Soft tissue mobilization;Passive ROM    Soft tissue mobilization to inferior breast scar  along area of fibrosis    Manual Lymphatic Drainage (MLD) short neck, 5 diaphragmatic breaths, left axillary nodes, Lt intact upper quadrant and establishment of interaxillary pathway, right inguinal nodes and establishment of axillo inguinal pathway, R breast moving fluid towards pathways then retracing all steps- verbally reviewed sequence with pt     Passive ROM to R shoulder at end range flexion and abduction while performing soft tissue mobilization to R pec                  PT Education - 04/29/20 1459    Education Details lying over foam roll in floor to improve pec tightness    Person(s) Educated Patient    Methods Explanation    Comprehension Verbalized understanding               PT Long Term Goals - 04/29/20 1407      PT LONG TERM GOAL #1   Title Pt will obtain appropriate compression garments for long term management of lymphedema    Baseline 04/29/20- pt obtained her compression bras    Time 4    Period Weeks    Status Achieved      PT LONG TERM GOAL #2   Title Pt will report a 50% decrease in R breast swelling to allow improved comfort.    Baseline 04/29/20- 5% improvement    Time 4    Period Weeks    Status On-going      PT LONG TERM GOAL #3   Title Pt will be independent in self MLD for long term management of lymphedema    Baseline 04/29/20- independent in with MLD    Time 4    Period Weeks    Status Achieved      PT LONG TERM GOAL #4   Title Pt will have no tightness in right axilla at end range flexion to allow improved comfort.    Baseline 04/29/20- pt reports that is improving    Time 4    Period Weeks    Status On-going                 Plan - 04/29/20 1454    Clinical Impression Statement Assessed pt's right breast for swelling today and she still presented with increased swelling at medial breast as evidenced by larger pore size. Overall her R breast is larger than her left. She has been wearing her compression bra but was  encouraged today to wear her chip pack in the bra more often to help decrease fibrosis. Pt would benefit from a few more sessions to decrease R breast  swelling and fibrosis and decrease pec tightness at end range.  Will see pt 2x next week then place pt on hold for 3-4 weeks and see if she is able to independently manage.    PT Frequency 2x / week    PT Duration 4 weeks    PT Treatment/Interventions ADLs/Self Care Home Management;Therapeutic exercise;Patient/family education;Manual lymph drainage;Manual techniques;Compression bandaging;Scar mobilization;Passive range of motion;Vasopneumatic Device    PT Next Visit Plan 2x/wk the second week of Nov then place pt on hold for a few weeks to see if she is able to indep manage her swelling, pec stretching at end range    PT Home Exercise Plan wear chip pack in bra for additional compression; daily self MLD    Consulted and Agree with Plan of Care Patient           Patient will benefit from skilled therapeutic intervention in order to improve the following deficits and impairments:  Increased edema, Decreased scar mobility, Increased fascial restricitons  Visit Diagnosis: Lymphedema, not elsewhere classified  Abnormal posture     Problem List Patient Active Problem List   Diagnosis Date Noted  . Age-related osteoporosis without current pathological fracture 04/09/2020  . Genetic testing 08/07/2019  . Family history of prostate cancer   . Family history of uterine cancer   . MTHFR gene mutation 01/16/2019  . Epigastric pain 01/15/2019  . 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) 12/24/2018  . Hypothyroid 11/11/2018  . Memory change 11/13/2016  . Common migraine with intractable migraine 04/06/2016  . H/O partial thyroidectomy 01/14/2014  . Family history of premature CAD 09/01/2013  . History of migraine headaches 02/21/2013  . Hyperlipidemia 02/21/2013  . Thyroid function study  abnormality 02/21/2013    Allyson Sabal [facility] 04/29/2020, 3:01 PM  [place], [place], [address] Phone: [phone]   Fax:  [phone]  Name: Kimberly Miles MRN: [id number] Date of Birth: August 15, 1955  Manus Gunning, PT 04/29/20 3:01 PM

## 2020-05-04 ENCOUNTER — Other Ambulatory Visit: Payer: Self-pay

## 2020-05-04 ENCOUNTER — Ambulatory Visit: Payer: Medicare Other | Admitting: Physical Therapy

## 2020-05-04 ENCOUNTER — Encounter: Payer: Self-pay | Admitting: Physical Therapy

## 2020-05-04 DIAGNOSIS — R293 Abnormal posture: Secondary | ICD-10-CM | POA: Diagnosis not present

## 2020-05-04 DIAGNOSIS — I890 Lymphedema, not elsewhere classified: Secondary | ICD-10-CM | POA: Diagnosis not present

## 2020-05-04 NOTE — Therapy (Signed)
[place], [place], [address] Phone: [phone]   Fax:  [phone]  Physical Therapy Treatment  Patient Details  Name: Kimberly Miles MRN: [id number] Date of Birth: [birth year] Referring Provider (PT): Barry Dienes   Encounter Date: 05/04/2020   PT End of Session - 05/04/20 1207    Visit Number 7    Number of Visits 9    Date for PT Re-Evaluation 05/27/20    PT Start Time 1106    PT Stop Time 1152    PT Time Calculation (min) 46 min    Activity Tolerance Patient tolerated treatment well    Behavior During Therapy [facility] for tasks assessed/performed           Past Medical History:  Diagnosis Date  . Anxiety   . Arthritis   . Cancer ([place]) 11/2018   right breast cancer  . Common migraine with intractable migraine 04/06/2016  . Complication of anesthesia   . Diverticulosis   . Elevated LFTs    monitored by pcp per pt --  unknown etiology  . Family history of premature CAD 09/01/2013  . Family history of prostate cancer   . Family history of uterine cancer   . History of basal cell carcinoma excision    2004   &  2014  . History of colitis   . History of palpitations   . 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 IBS (irritable bowel syndrome)   . Migraine   . MTHFR gene mutation   . Personal history of radiation therapy   . PMB (postmenopausal bleeding)   . PONV (postoperative nausea and vomiting)   . Post-surgical hypothyroidism     Past Surgical History:  Procedure Laterality Date  . APPENDECTOMY  as child  . BREAST BIOPSY Left 1979   benign  . BREAST EXCISIONAL BIOPSY Left   . BREAST LUMPECTOMY Right 01/2019  . BREAST LUMPECTOMY WITH RADIOACTIVE SEED AND SENTINEL LYMPH NODE BIOPSY Right 02/04/2019   Procedure: RIGHT BREAST LUMPECTOMY WITH RADIOACTIVE SEED AND RIGHT AXILLARY DEEP SENTINEL LYMPH NODE BIOPSY  WITH BLUE DYE INJECTION;  Surgeon: Fanny Skates, MD;  Location: [place];  Service: General;  Laterality: Right;  . BREAST REDUCTION SURGERY Bilateral 02/11/2019   Procedure: RIGHT BREAST ONCOPLASTIC RECONSTRUCTION, LEFT BREAST REDUCTION;  Surgeon: Irene Limbo, MD;  Location: [place];  Service: Plastics;  Laterality: Bilateral;  . CARDIOVASCULAR STRESS TEST  09-16-2013   normal nuclear study w/ no ischemia/  normal LV function and wall motion , ef 84%  . CESAREAN SECTION  1977 and 1978   Bilateral Tubal Ligation w/ last c/s  . COLPOSCOPY  05/2009   CIN 1  . HYSTEROSCOPY WITH D & C N/A 12/23/2015   Procedure: DILATATION AND CURETTAGE /HYSTEROSCOPY ;  Surgeon: Megan Salon, MD;  Location: [facility];  Service: Gynecology;  Laterality: N/A;  . NEGATIVE SLEEP STUDY  2014  per pt  . OOPHORECTOMY Right 1985   ruptured cyst   . REDUCTION MAMMAPLASTY Bilateral 01/2019  . THYROID LOBECTOMY Right 03-08-2011  . TRANSTHORACIC ECHOCARDIOGRAM  05-09-2011   ef 64%/  mild MR and TR  . TUBAL LIGATION      There were no vitals filed for this visit.   Subjective Assessment - 05/04/20 1109    Subjective I am doing the massage twice a day.  Pertinent History R breast cancer, s/p lumpectomy and SLNB on 02/04/19, completed radiation, current taking anti estrogen pill    Patient Stated Goals to alleviate some of the soreness, tightness and size of R breast    Currently in Pain? Yes    Pain Score 3     Pain Location Throat    Pain Orientation Anterior    Pain Descriptors / Indicators --   raw   Aggravating Factors  swallowing                       [facility]  Lymphedema Life Impact Scale Total Score 7.35 %            [facility] Adult PT Treatment/Exercise - 05/04/20 0001      Manual Therapy   Soft tissue mobilization in left sidelying to right rhomboids,  lats, upper traps and levator to see if this would help decrease pain pt feels at anterior chest - numerous trigger points noted and pt did feel better at end of session                  PT Education - 05/04/20 1211    Education Details follow up with doctor if she is having any additional abnormal symptoms other than pain with swallowing including dizziness    Person(s) Educated Patient    Methods Explanation    Comprehension Verbalized understanding               PT Long Term Goals - 04/29/20 1407      PT LONG TERM GOAL #1   Title Pt will obtain appropriate compression garments for long term management of lymphedema    Baseline 04/29/20- pt obtained her compression bras    Time 4    Period Weeks    Status Achieved      PT LONG TERM GOAL #2   Title Pt will report a 50% decrease in R breast swelling to allow improved comfort.    Baseline 04/29/20- 5% improvement    Time 4    Period Weeks    Status On-going      PT LONG TERM GOAL #3   Title Pt will be independent in self MLD for long term management of lymphedema    Baseline 04/29/20- independent in with MLD    Time 4    Period Weeks    Status Achieved      PT LONG TERM GOAL #4   Title Pt will have no tightness in right axilla at end range flexion to allow improved comfort.    Baseline 04/29/20- pt reports that is improving    Time 4    Period Weeks    Status On-going                 Plan - 05/04/20 1208    Clinical Impression Statement Pt presents to PT today with pain on swallowing that radiates down her chest on the right side. She reports it is a deep pain. She has not other abnormal symptoms and denies any dizziness. Pt reports it began yesterday morning. She does have tightness noted at right anterior neck. SHe plans on going to her chiropractor since she has been having increased back and neck pain. Worked today on scapular musculature to help relieve tightness to see if this would help relieve pain  in case pain was being referred from this area. Pt reported at end of  session she did feel some improvement.    PT Frequency 2x / week    PT Duration 4 weeks    PT Treatment/Interventions ADLs/Self Care Home Management;Therapeutic exercise;Patient/family education;Manual lymph drainage;Manual techniques;Compression bandaging;Scar mobilization;Passive range of motion;Vasopneumatic Device    PT Next Visit Plan see if she is having neck pain- reassess breast either d/c or place on hold    PT Home Exercise Plan wear chip pack in bra for additional compression; daily self MLD    Consulted and Agree with Plan of Care Patient           Patient will benefit from skilled therapeutic intervention in order to improve the following deficits and impairments:  Increased edema, Decreased scar mobility, Increased fascial restricitons  Visit Diagnosis: Abnormal posture     Problem List Patient Active Problem List   Diagnosis Date Noted  . Age-related osteoporosis without current pathological fracture 04/09/2020  . Genetic testing 08/07/2019  . Family history of prostate cancer   . Family history of uterine cancer   . MTHFR gene mutation 01/16/2019  . Epigastric pain 01/15/2019  . 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 Hypothyroid 11/11/2018  . Memory change 11/13/2016  . Common migraine with intractable migraine 04/06/2016  . H/O partial thyroidectomy 01/14/2014  . Family history of premature CAD 09/01/2013  . History of migraine headaches 02/21/2013  . Hyperlipidemia 02/21/2013  . Thyroid function study abnormality 02/21/2013    Allyson Sabal [facility] 05/04/2020, 12:12 PM  [place], [place], [address] Phone: [phone]   Fax:  [phone]  Name: Dena Esperanza MRN: [id number] Date of Birth: 1956-02-13  Manus Gunning,  PT 05/04/20 12:12 PM

## 2020-05-06 ENCOUNTER — Ambulatory Visit: Payer: Medicare Other | Admitting: Physical Therapy

## 2020-05-10 ENCOUNTER — Other Ambulatory Visit: Payer: Self-pay

## 2020-05-10 ENCOUNTER — Encounter: Payer: Self-pay | Admitting: Physical Therapy

## 2020-05-10 ENCOUNTER — Ambulatory Visit: Payer: Medicare Other | Admitting: Physical Therapy

## 2020-05-10 DIAGNOSIS — I890 Lymphedema, not elsewhere classified: Secondary | ICD-10-CM

## 2020-05-10 DIAGNOSIS — R293 Abnormal posture: Secondary | ICD-10-CM

## 2020-05-10 NOTE — Therapy (Signed)
[place], [place], [address] Phone: [phone]   Fax:  [phone]  Physical Therapy Treatment  Patient Details  Name: Jennylee Uehara MRN: [id number] Date of Birth: Oct 07, 1955 Referring Provider (PT): Barry Dienes   Encounter Date: 05/10/2020   PT End of Session - 05/10/20 1706    Visit Number 8    Number of Visits 9    Date for PT Re-Evaluation 05/27/20    PT Start Time 2725    PT Stop Time 1654    PT Time Calculation (min) 47 min    Activity Tolerance Patient tolerated treatment well    Behavior During Therapy [facility] for tasks assessed/performed           Past Medical History:  Diagnosis Date  . Anxiety   . Arthritis   . Cancer ([place]) 11/2018   right breast cancer  . Common migraine with intractable migraine 04/06/2016  . Complication of anesthesia   . Diverticulosis   . Elevated LFTs    monitored by pcp per pt --  unknown etiology  . Family history of premature CAD 09/01/2013  . Family history of prostate cancer   . Family history of uterine cancer   . History of basal cell carcinoma excision    2004   &  2014  . History of colitis   . History of palpitations   . 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 IBS (irritable bowel syndrome)   . Migraine   . MTHFR gene mutation   . Personal history of radiation therapy   . PMB (postmenopausal bleeding)   . PONV (postoperative nausea and vomiting)   . Post-surgical hypothyroidism     Past Surgical History:  Procedure Laterality Date  . APPENDECTOMY  as child  . BREAST BIOPSY Left 1979   benign  . BREAST EXCISIONAL BIOPSY Left   . BREAST LUMPECTOMY Right 01/2019  . BREAST LUMPECTOMY WITH RADIOACTIVE SEED AND SENTINEL LYMPH NODE BIOPSY Right 02/04/2019   Procedure: RIGHT BREAST LUMPECTOMY WITH RADIOACTIVE SEED AND RIGHT AXILLARY DEEP SENTINEL LYMPH NODE BIOPSY  WITH BLUE DYE INJECTION;  Surgeon: Fanny Skates, MD;  Location: [place];  Service: General;  Laterality: Right;  . BREAST REDUCTION SURGERY Bilateral 02/11/2019   Procedure: RIGHT BREAST ONCOPLASTIC RECONSTRUCTION, LEFT BREAST REDUCTION;  Surgeon: Irene Limbo, MD;  Location: [place];  Service: Plastics;  Laterality: Bilateral;  . CARDIOVASCULAR STRESS TEST  09-16-2013   normal nuclear study w/ no ischemia/  normal LV function and wall motion , ef 84%  . CESAREAN SECTION  1977 and 1978   Bilateral Tubal Ligation w/ last c/s  . COLPOSCOPY  05/2009   CIN 1  . HYSTEROSCOPY WITH D & C N/A 12/23/2015   Procedure: DILATATION AND CURETTAGE /HYSTEROSCOPY ;  Surgeon: Megan Salon, MD;  [facility];  Service: Gynecology;  Laterality: N/A;  . NEGATIVE SLEEP STUDY  2014  per pt  . OOPHORECTOMY Right 1985   ruptured cyst   . REDUCTION MAMMAPLASTY Bilateral 01/2019  . THYROID LOBECTOMY Right 03-08-2011  . TRANSTHORACIC ECHOCARDIOGRAM  05-09-2011   ef 64%/  mild MR and TR  . TUBAL LIGATION      There were no vitals filed for this visit.   Subjective Assessment - 05/10/20 1607    Subjective I am doing the massage once or twice  a day. I have added rebounding to help lymphatic flow.    Pertinent History R breast cancer, s/p lumpectomy and SLNB on 02/04/19, completed radiation, current taking anti estrogen pill    Patient Stated Goals to alleviate some of the soreness, tightness and size of R breast    Currently in Pain? No/denies    Pain Score 0-No pain                       Outpatient Rehab from 03/31/2020 in [place]  Lymphedema Life Impact Scale Total Score 7.35 %            [facility] Adult PT Treatment/Exercise - 05/10/20 0001      Manual Therapy   Manual Lymphatic Drainage (MLD) short neck, 5 diaphragmatic breaths, left axillary nodes, Lt intact upper quadrant and  establishment of interaxillary pathway, right inguinal nodes and establishment of axillo inguinal pathway, R breast moving fluid towards pathways then retracing all steps- verbally reviewed sequence with pt     Passive ROM to R shoulder at end range flexion and abduction while performing soft tissue mobilization to R pec                       PT Long Term Goals - 05/10/20 1608      PT LONG TERM GOAL #1   Title Pt will obtain appropriate compression garments for long term management of lymphedema    Baseline 04/29/20- pt obtained her compression bras    Time 4    Period Weeks    Status Achieved      PT LONG TERM GOAL #2   Title Pt will report a 50% decrease in R breast swelling to allow improved comfort.    Baseline 04/29/20- 5% improvement; 05/10/20- 50% improved    Time 4    Period Weeks    Status Achieved      PT LONG TERM GOAL #3   Title Pt will be independent in self MLD for long term management of lymphedema    Baseline 04/29/20- independent in with MLD    Time 4    Period Weeks    Status Achieved      PT LONG TERM GOAL #4   Title Pt will have no tightness in right axilla at end range flexion to allow improved comfort.    Baseline 04/29/20- pt reports that is improving; 05/10/20- pt reports minimal tightness in this area    Time 4    Period Weeks    Status On-going                 Plan - 05/10/20 1701    Clinical Impression Statement Pt continues to do self massage at home and has been using the rebounder. She is going to try to self manage at home over the next three weeks and then return back to PT for assessment to see if she was able to manage her swelling independently.    PT Frequency Other (comment)   follow up in 4 weeks   PT Duration 4 weeks    PT Treatment/Interventions ADLs/Self Care Home Management;Therapeutic exercise;Patient/family education;Manual lymph drainage;Manual techniques;Compression bandaging;Scar mobilization;Passive range of  motion;Vasopneumatic Device    PT Next Visit Plan reassess breast swelling    PT Home Exercise Plan wear chip pack in bra for additional compression; daily self MLD    Consulted and Agree with Plan of Care Patient  Patient will benefit from skilled therapeutic intervention in order to improve the following deficits and impairments:  Increased edema, Decreased scar mobility, Increased fascial restricitons  Visit Diagnosis: Lymphedema, not elsewhere classified  Abnormal posture     Problem List Patient Active Problem List   Diagnosis Date Noted  . Age-related osteoporosis without current pathological fracture 04/09/2020  . Genetic testing 08/07/2019  . Family history of prostate cancer   . Family history of uterine cancer   . MTHFR gene mutation 01/16/2019  . Epigastric pain 01/15/2019  . 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 Hypothyroid 11/11/2018  . Memory change 11/13/2016  . Common migraine with intractable migraine 04/06/2016  . H/O partial thyroidectomy 01/14/2014  . Family history of premature CAD 09/01/2013  . History of migraine headaches 02/21/2013  . Hyperlipidemia 02/21/2013  . Thyroid function study abnormality 02/21/2013    Allyson Sabal [facility] 05/10/2020, 5:07 PM  [place], [place], [address] Phone: [phone]   Fax:  [phone]  Name: Taunya Goral MRN: [id number] Date of Birth: April 16, 1956  Manus Gunning, PT 05/10/20 5:09 PM

## 2020-05-27 ENCOUNTER — Encounter: Payer: Self-pay | Admitting: Adult Health

## 2020-06-02 ENCOUNTER — Other Ambulatory Visit: Payer: Self-pay | Admitting: Adult Health

## 2020-06-02 DIAGNOSIS — Z170 Estrogen receptor positive status [ER+]: Secondary | ICD-10-CM

## 2020-06-02 DIAGNOSIS — C50311 Malignant neoplasm of lower-inner quadrant of right female breast: Secondary | ICD-10-CM

## 2020-06-02 NOTE — Progress Notes (Signed)
Breast MRI being ordered, as breast cancer screening in addition to mammogram due to her initial breast cancer being mammogram occult and she has heterogeneously dense breasts.  Wilber Bihari, NP

## 2020-06-04 ENCOUNTER — Inpatient Hospital Stay: Payer: Medicare Other | Admitting: Adult Health

## 2020-06-08 ENCOUNTER — Encounter: Payer: Medicare Other | Admitting: Physical Therapy

## 2020-06-10 ENCOUNTER — Ambulatory Visit: Payer: Medicare Other | Admitting: Adult Health

## 2020-06-14 ENCOUNTER — Ambulatory Visit (HOSPITAL_COMMUNITY)
Admission: RE | Admit: 2020-06-14 | Discharge: 2020-06-14 | Disposition: A | Discharge status: Home / Self Care | Payer: Medicare Other | Source: Ambulatory Visit | Attending: Adult Health | Admitting: Adult Health

## 2020-06-14 ENCOUNTER — Other Ambulatory Visit: Payer: Self-pay

## 2020-06-14 DIAGNOSIS — R928 Other abnormal and inconclusive findings on diagnostic imaging of breast: Secondary | ICD-10-CM | POA: Diagnosis not present

## 2020-06-14 DIAGNOSIS — C50311 Malignant neoplasm of lower-inner quadrant of right female breast: Secondary | ICD-10-CM

## 2020-06-14 DIAGNOSIS — Z853 Personal history of malignant neoplasm of breast: Secondary | ICD-10-CM | POA: Diagnosis not present

## 2020-06-14 DIAGNOSIS — Z9889 Other specified postprocedural states: Secondary | ICD-10-CM | POA: Diagnosis not present

## 2020-06-14 DIAGNOSIS — Z170 Estrogen receptor positive status [ER+]: Secondary | ICD-10-CM | POA: Diagnosis not present

## 2020-06-14 MED ORDER — GADOBUTROL 1 MMOL/ML IV SOLN
5.0000 mL | Freq: Once | INTRAVENOUS | Status: AC | PRN
  Administered 2020-06-14: 5 mL via INTRAVENOUS

## 2020-06-15 ENCOUNTER — Other Ambulatory Visit: Payer: Self-pay | Admitting: Adult Health

## 2020-06-15 DIAGNOSIS — R9389 Abnormal findings on diagnostic imaging of other specified body structures: Secondary | ICD-10-CM

## 2020-06-21 ENCOUNTER — Other Ambulatory Visit: Payer: Self-pay

## 2020-06-21 ENCOUNTER — Ambulatory Visit
Admission: RE | Admit: 2020-06-21 | Discharge: 2020-06-21 | Disposition: A | Discharge status: Home / Self Care | Payer: Medicare Other | Source: Ambulatory Visit | Attending: Adult Health | Admitting: Adult Health

## 2020-06-21 ENCOUNTER — Other Ambulatory Visit: Payer: Self-pay | Admitting: Body Imaging

## 2020-06-21 DIAGNOSIS — R9389 Abnormal findings on diagnostic imaging of other specified body structures: Secondary | ICD-10-CM

## 2020-06-21 DIAGNOSIS — N6012 Diffuse cystic mastopathy of left breast: Secondary | ICD-10-CM | POA: Diagnosis not present

## 2020-06-21 DIAGNOSIS — R928 Other abnormal and inconclusive findings on diagnostic imaging of breast: Secondary | ICD-10-CM | POA: Diagnosis not present

## 2020-06-21 HISTORY — PX: BREAST BIOPSY: SHX20

## 2020-06-21 MED ORDER — GADOBUTROL 1 MMOL/ML IV SOLN
5.0000 mL | Freq: Once | INTRAVENOUS | Status: AC | PRN
  Administered 2020-06-21: 5 mL via INTRAVENOUS

## 2020-06-23 ENCOUNTER — Inpatient Hospital Stay: Payer: Medicare Other | Attending: Adult Health | Admitting: Adult Health

## 2020-06-23 ENCOUNTER — Encounter: Payer: Self-pay | Admitting: Adult Health

## 2020-06-23 ENCOUNTER — Other Ambulatory Visit: Payer: Self-pay

## 2020-06-23 VITALS — BP 103/62 | HR 75 | Temp 99.2°F | Resp 18 | Ht 60.0 in | Wt 112.1 lb

## 2020-06-23 DIAGNOSIS — C50311 Malignant neoplasm of lower-inner quadrant of right female breast: Secondary | ICD-10-CM

## 2020-06-23 DIAGNOSIS — E2839 Other primary ovarian failure: Secondary | ICD-10-CM

## 2020-06-23 DIAGNOSIS — M810 Age-related osteoporosis without current pathological fracture: Secondary | ICD-10-CM | POA: Diagnosis not present

## 2020-06-23 DIAGNOSIS — C50211 Malignant neoplasm of upper-inner quadrant of right female breast: Secondary | ICD-10-CM | POA: Insufficient documentation

## 2020-06-23 DIAGNOSIS — Z923 Personal history of irradiation: Secondary | ICD-10-CM | POA: Diagnosis not present

## 2020-06-23 DIAGNOSIS — Z170 Estrogen receptor positive status [ER+]: Secondary | ICD-10-CM | POA: Diagnosis not present

## 2020-06-23 NOTE — Assessment & Plan Note (Addendum)
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 daily decrease to half a tablet daily 12/18/2019 discontinued because of continued muscle aches and pains Switched to letrozole 02/25/2020 half a tablet daily  Letrozole toxicities:   Breast cancer surveillance: Both mammograms and breast MRIs because only breast MRI picked up her cancer. Mammogram 01/06/2020 no mammographic evidence of malignancy breast density category C Breast MRI 05/2020: no malignancy Bone density 12/02/2019; T score -3  Osteoporosis: Patient wants to try osteogenic's exercise/specialized equipment program to improve bone density.  I placed order for her to undergo bone density test.  I let her know that insurance may not approve it.  She understands this.  We will send it in and see what they say.  She will return in about 6 months for f/u with [name].

## 2020-06-23 NOTE — Progress Notes (Signed)
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, estrogen receptor positive ([place])  12/24/2018 Initial Diagnosis   Palpable lump in the right breast, 2.1 cm, 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   02/04/2019 Cancer Staging   Staging form: Breast, AJCC 8th Edition - Pathologic stage from 02/04/2019: Stage IA (pT2, pN0, cM0, G1, ER+, PR+, HER2-, Oncotype DX score: 18) - Signed by Gardenia Phlegm, NP on 02/19/2019   02/11/2019 Surgery   Bil Mammaplasty: benign   03/24/2019 - 04/14/2019 Radiation Therapy   The patient initially received a dose of 42.56 Gy in 16 fractions to the breast using whole-breast tangent fields. This was delivered using a 3-D conformal technique. The total dose was 42.56 Gy.    Oncotype testing   The Oncotype DX score was 18 predicting a risk of outside the breast recurrence over the next 9 years of 5 % if the  patient's only systemic therapy is tamoxifen for 5 years.   08/06/2019 Genetic Testing   Negative genetic testing on the common hereditary cancer panel.  The Common Hereditary Gene Panel offered by [facility] includes sequencing and/or deletion duplication testing of the following 48 genes: APC, ATM, AXIN2, BARD1, BMPR1A, BRCA1, BRCA2, BRIP1, CDH1, CDK4, CDKN2A (p14ARF), CDKN2A (p16INK4a), CHEK2, CTNNA1, DICER1, EPCAM (Deletion/duplication testing only), GREM1 (promoter region deletion/duplication testing only), KIT, MEN1, MLH1, MSH2, MSH3, MSH6, MUTYH, NBN, NF1, NHTL1, PALB2, PDGFRA, PMS2, POLD1, POLE, PTEN, RAD50, RAD51C, RAD51D, RNF43, SDHB, SDHC, SDHD, SMAD4, SMARCA4. STK11, TP53, TSC1, TSC2, and VHL.  The following genes were evaluated for sequence changes only: SDHA and HOXB13 c.251G>A variant only. The report date is August 06, 2019.     CURRENT THERAPY: to start letrozole  INTERVAL HISTORY: Kimberly Miles 64 y.o. female returns for evaluation of her history of bresat cancer.  She is doing moderatley well.  She was unable to tolerate her anastrozole and is now planning on starting letrozole.  She is doing well overall.  She is participating in osteo strong for her bones and wonders if she could get another bone density test.  Her most recent bone density was completed    Patient Active Problem List   Diagnosis Date Noted  . Age-related osteoporosis without current pathological fracture 04/09/2020  . Genetic testing 08/07/2019  . Family history of prostate cancer   . Family history of uterine cancer   . MTHFR gene mutation  01/16/2019  . Epigastric pain 01/15/2019  . 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 Hypothyroid 11/11/2018  . Memory change 11/13/2016  . Common migraine with intractable migraine 04/06/2016  . H/O partial thyroidectomy 01/14/2014  . Family history of premature CAD 09/01/2013  . History of  migraine headaches 02/21/2013  . Hyperlipidemia 02/21/2013  . Thyroid function study abnormality 02/21/2013    is allergic to codeine, demerol [meperidine], erythromycin, sulfa antibiotics, ampicillin, and chlorhexidine.  MEDICAL HISTORY: Past Medical History:  Diagnosis Date  . Anxiety   . Arthritis   . Cancer ([place]) 11/2018   right breast cancer  . Common migraine with intractable migraine 04/06/2016  . Complication of anesthesia   . Diverticulosis   . Elevated LFTs    monitored by pcp per pt --  unknown etiology  . Family history of premature CAD 09/01/2013  . Family history of prostate cancer   . Family history of uterine cancer   . History of basal cell carcinoma excision    2004   &  2014  . History of colitis   . History of palpitations   . 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 IBS (irritable bowel syndrome)   . Migraine   . MTHFR gene mutation   . Personal history of radiation therapy   . PMB (postmenopausal bleeding)   . PONV (postoperative nausea and vomiting)   . Post-surgical hypothyroidism     SURGICAL HISTORY: Past Surgical History:  Procedure Laterality Date  . APPENDECTOMY  as child  . BREAST BIOPSY Left 1979   benign  . BREAST EXCISIONAL BIOPSY Left   . BREAST LUMPECTOMY Right 01/2019  . BREAST LUMPECTOMY WITH RADIOACTIVE SEED AND SENTINEL LYMPH NODE BIOPSY Right 02/04/2019   Procedure: RIGHT BREAST LUMPECTOMY WITH RADIOACTIVE SEED AND RIGHT AXILLARY DEEP SENTINEL LYMPH NODE BIOPSY WITH BLUE DYE INJECTION;  Surgeon: Fanny Skates, MD;  Location: [place];  Service: General;  Laterality: Right;  . BREAST REDUCTION SURGERY Bilateral 02/11/2019   Procedure: RIGHT BREAST ONCOPLASTIC RECONSTRUCTION, LEFT BREAST REDUCTION;  Surgeon: Irene Limbo, MD;  Location: [place];  Service: Plastics;  Laterality: Bilateral;  . CARDIOVASCULAR STRESS TEST   09-16-2013   normal nuclear study w/ no ischemia/  normal LV function and wall motion , ef 84%  . CESAREAN SECTION  1977 and 1978   Bilateral Tubal Ligation w/ last c/s  . COLPOSCOPY  05/2009   CIN 1  . HYSTEROSCOPY WITH D & C N/A 12/23/2015   Procedure: DILATATION AND CURETTAGE /HYSTEROSCOPY ;  Surgeon: Megan Salon, MD;  Location: [facility];  Service: Gynecology;  Laterality: N/A;  . NEGATIVE SLEEP STUDY  2014  per pt  . OOPHORECTOMY Right 1985   ruptured cyst   . REDUCTION MAMMAPLASTY Bilateral 01/2019  . THYROID LOBECTOMY Right 03-08-2011  . TRANSTHORACIC ECHOCARDIOGRAM  05-09-2011   ef 64%/  mild MR and TR  . TUBAL LIGATION      SOCIAL HISTORY: Social History   Socioeconomic History  . Marital status: Married    Spouse name: Not on file  . Number of children: 2  . Years of education: 70  . Highest education level: Not on file  Occupational History  . Occupation: Retired  Tobacco Use  . Smoking status: Never Smoker  . Smokeless tobacco: Never Used  Vaping Use  . Vaping Use: Never used  Substance and Sexual Activity  . Alcohol use: Not Currently    Alcohol/week: 0.0 standard drinks  . Drug use: No  . Sexual activity: Yes    Partners: Male    Birth control/protection: Post-menopausal    Comment: BTL done w/ last C/S  Other Topics Concern  . Not on file  Social History Narrative   Lives at home w/ her husband   Right-handed   Caffeine: 1/4 cup per day   Social Determinants of Health   Financial Resource Strain: Not on file  Food Insecurity: Not on file  Transportation Needs: Not on file  Physical Activity: Not on file  Stress: Not on file  Social Connections: Not on file  Intimate Partner Violence: Not on file    FAMILY HISTORY: Family History  Problem Relation Age of Onset  . Hypertension Maternal Grandmother   . Uterine cancer Maternal Grandmother 44  . Vascular Disease Maternal Grandmother   . Lung cancer Maternal Grandmother 84   . Stroke Maternal Grandfather   . Prostate cancer Paternal Grandfather 63  . Migraines Paternal Grandmother   . Lung cancer Mother 2  . Hypertension Mother 76  . Hyperlipidemia Mother 35  . Ehlers-Danlos syndrome Mother   . Kidney disease Mother   . Fibromyalgia Mother   . Other Father        bypass surgery times 2  . Migraines Father   . Osteoporosis Father   . Kidney disease Maternal Aunt     Review of Systems  Constitutional: Negative for appetite change, chills, fatigue, fever and unexpected weight change.  HENT:   Negative for hearing loss, lump/mass and trouble swallowing.   Eyes: Negative for eye problems and icterus.  Respiratory: Negative for chest tightness, cough and shortness of breath.   Cardiovascular: Negative for chest pain, leg swelling and palpitations.  Gastrointestinal: Negative for abdominal distention, abdominal pain, constipation, diarrhea, nausea and vomiting.  Endocrine: Negative for hot flashes.  Genitourinary: Negative for difficulty urinating.   Musculoskeletal: Positive for arthralgias.  Skin: Negative for itching and rash.  Neurological: Negative for dizziness, extremity weakness, headaches and numbness.  Hematological: Negative for adenopathy. Does not bruise/bleed easily.  Psychiatric/Behavioral: Negative for depression. The patient is not nervous/anxious.       PHYSICAL EXAMINATION  ECOG PERFORMANCE STATUS: 1 - Symptomatic but completely ambulatory  Vitals:   06/23/20 1457  BP: 103/62  Pulse: 75  Resp: 18  Temp: 99.2 F (37.3 C)  SpO2: 100%    Physical Exam Constitutional:      General: She is not in acute distress.    Appearance: Normal appearance. She is not toxic-appearing.  HENT:     Head: Normocephalic and atraumatic.     Nose: No congestion.  Eyes:     General: No scleral icterus.    Pupils: Pupils are equal, round, and reactive to light.  Cardiovascular:     Rate and Rhythm: Normal rate and regular rhythm.      Pulses: Normal pulses.     Heart sounds: Normal heart sounds.  Pulmonary:     Effort: Pulmonary effort is normal.     Breath sounds: Normal breath sounds.     Comments: Right breast s/p lumpectomy, no signs of local recurrence, left breast benign Abdominal:     General: Abdomen is flat. Bowel sounds are normal.     Palpations: Abdomen is soft.  Musculoskeletal:        General: No  swelling.     Cervical back: Neck supple.  Lymphadenopathy:     Cervical: No cervical adenopathy.  Skin:    Capillary Refill: Capillary refill takes less than 2 seconds.  Neurological:     General: No focal deficit present.     Mental Status: She is alert.  Psychiatric:        Mood and Affect: Mood normal.        Behavior: Behavior normal.     LABORATORY DATA:  CBC    Component Value Date/Time   WBC 5.9 03/22/2017 1409   RBC 4.43 03/22/2017 1409   HGB 14.4 03/22/2017 1409   HGB 13.7 07/11/2013 1558   HCT 43.3 03/22/2017 1409   PLT 210.0 03/22/2017 1409   MCV 97.8 03/22/2017 1409   MCH 31.7 09/01/2013 1100   MCHC 33.2 03/22/2017 1409   RDW 12.8 03/22/2017 1409   LYMPHSABS 1.5 03/22/2017 1409   MONOABS 0.4 03/22/2017 1409   EOSABS 0.0 03/22/2017 1409   BASOSABS 0.0 03/22/2017 1409    CMP     Component Value Date/Time   NA 142 10/03/2019 1203   K 4.0 10/03/2019 1203   CL 103 10/03/2019 1203   CO2 25 10/03/2019 1203   GLUCOSE 84 10/03/2019 1203   GLUCOSE 77 09/26/2017 1030   BUN 7 (L) 10/03/2019 1203   CREATININE 0.58 10/03/2019 1203   CREATININE 0.67 11/01/2015 1100   CALCIUM 9.1 10/03/2019 1203   PROT 6.5 04/09/2020 1017   PROT 6.4 10/03/2019 1203   ALBUMIN 4.7 10/03/2019 1203   AST 26 04/09/2020 1017   ALT 28 04/09/2020 1017   ALKPHOS 89 10/03/2019 1203   BILITOT 0.4 04/09/2020 1017   BILITOT 0.3 10/03/2019 1203   GFRNONAA 96 10/03/2019 1203   GFRAA 111 10/03/2019 1203          ASSESSMENT and THERAPY PLAN:   Carcinoma of lower-inner quadrant of right breast in  female, estrogen receptor positive ([facility]) 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 daily decrease to half a tablet daily 12/18/2019 discontinued because of continued muscle aches and pains Switched to letrozole 02/25/2020 half a tablet daily  Letrozole toxicities:   Breast cancer surveillance: Both mammograms and breast MRIs because only breast MRI picked up her cancer. Mammogram 01/06/2020 no mammographic evidence of malignancy breast density category C Breast MRI 05/2020: no malignancy Bone density 12/02/2019; T score -3  Osteoporosis: Patient wants to try osteogenic's exercise/specialized equipment program to improve bone density.  I placed order for her to undergo bone density test.  I let her know that insurance may not approve it.  She understands this.  We will send it in and see what they say.  She will return in about 6 months for f/u with [name].   Orders Placed This Encounter  Procedures  . DG Bone Density    Standing Status:   Future    Standing Expiration Date:   06/23/2021    Order Specific Question:  Reason for Exam (SYMPTOM  OR DIAGNOSIS REQUIRED)    Answer:   estrogen deficiency, osteoporosis, aromatase inhibitor use    Order Specific Question:   Preferred imaging location?    Answer:   External    Comments:   [facility]     All questions were answered. The patient knows to call the clinic with any problems, questions or concerns. We can certainly see the patient much sooner if necessary.  Total encounter time: 20 minutes*  Wilber Bihari, NP 06/25/20 2:54 PM Medical Oncology and Hematology [facility] [place],  [address] Tel. [phone]    Fax. [phone]  *Total Encounter Time as defined by the Centers for Medicare and Medicaid Services includes, in addition to the face-to-face time of a patient visit (documented in the note above) non-face-to-face time: obtaining and reviewing outside history, ordering and reviewing medications, tests or procedures, care coordination (communications with other health care professionals or caregivers) and documentation in the medical record.

## 2020-06-24 ENCOUNTER — Telehealth: Payer: Self-pay | Admitting: Hematology and Oncology

## 2020-06-24 NOTE — Telephone Encounter (Signed)
Scheduled appts per 12/29 los. Pt confirmed appt date and time.  

## 2020-06-29 ENCOUNTER — Ambulatory Visit: Payer: Medicare Other | Attending: Family Medicine | Admitting: Physical Therapy

## 2020-06-29 ENCOUNTER — Other Ambulatory Visit: Payer: Self-pay

## 2020-06-29 ENCOUNTER — Encounter: Payer: Self-pay | Admitting: Physical Therapy

## 2020-06-29 DIAGNOSIS — I890 Lymphedema, not elsewhere classified: Secondary | ICD-10-CM | POA: Diagnosis not present

## 2020-06-29 NOTE — Therapy (Signed)
[place], [place], [address] Phone: [phone]   Fax:  [phone]  Physical Therapy Treatment  Patient Details  Name: Kimberly Miles MRN: [id number] Date of Birth: 1956/04/21 Referring Provider (PT): Barry Dienes   Encounter Date: 06/29/2020   PT End of Session - 06/29/20 1157    Visit Number 9    Number of Visits 9    Date for PT Re-Evaluation 05/27/20    PT Start Time 1105    PT Stop Time 1154    PT Time Calculation (min) 49 min    Activity Tolerance Patient tolerated treatment well    Behavior During Therapy [facility] for tasks assessed/performed           Past Medical History:  Diagnosis Date  . Anxiety   . Arthritis   . Cancer ([place]) 11/2018   right breast cancer  . Common migraine with intractable migraine 04/06/2016  . Complication of anesthesia   . Diverticulosis   . Elevated LFTs    monitored by pcp per pt --  unknown etiology  . Family history of premature CAD 09/01/2013  . Family history of prostate cancer   . Family history of uterine cancer   . History of basal cell carcinoma excision    2004   &  2014  . History of colitis   . History of palpitations   . 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 IBS (irritable bowel syndrome)   . Migraine   . MTHFR gene mutation   . Personal history of radiation therapy   . PMB (postmenopausal bleeding)   . PONV (postoperative nausea and vomiting)   . Post-surgical hypothyroidism     Past Surgical History:  Procedure Laterality Date  . APPENDECTOMY  as child  . BREAST BIOPSY Left 1979   benign  . BREAST EXCISIONAL BIOPSY Left   . BREAST LUMPECTOMY Right 01/2019  . BREAST LUMPECTOMY WITH RADIOACTIVE SEED AND SENTINEL LYMPH NODE BIOPSY Right 02/04/2019   Procedure: RIGHT BREAST LUMPECTOMY WITH RADIOACTIVE SEED AND RIGHT AXILLARY DEEP SENTINEL LYMPH NODE BIOPSY  WITH BLUE DYE INJECTION;  Surgeon: Fanny Skates, MD;  Location: [facility];  Service: General;  Laterality: Right;  . BREAST REDUCTION SURGERY Bilateral 02/11/2019   Procedure: RIGHT BREAST ONCOPLASTIC RECONSTRUCTION, LEFT BREAST REDUCTION;  Surgeon: Irene Limbo, MD;  Location: [facility];  Service: Plastics;  Laterality: Bilateral;  . CARDIOVASCULAR STRESS TEST  09-16-2013   normal nuclear study w/ no ischemia/  normal LV function and wall motion , ef 84%  . CESAREAN SECTION  1977 and 1978   Bilateral Tubal Ligation w/ last c/s  . COLPOSCOPY  05/2009   CIN 1  . HYSTEROSCOPY WITH D & C N/A 12/23/2015   Procedure: DILATATION AND CURETTAGE /HYSTEROSCOPY ;  Surgeon: Megan Salon, MD;  Location: [facility];  Service: Gynecology;  Laterality: N/A;  . NEGATIVE SLEEP STUDY  2014  per pt  . OOPHORECTOMY Right 1985   ruptured cyst   . REDUCTION MAMMAPLASTY Bilateral 01/2019  . THYROID LOBECTOMY Right 03-08-2011  . TRANSTHORACIC ECHOCARDIOGRAM  05-09-2011   ef 64%/  mild MR and TR  . TUBAL LIGATION      There were no vitals filed for this visit.   Subjective Assessment - 06/29/20 1106    Subjective I feel like the swelling is about the  same. I don't think it has gotten worse or better.    Pertinent History R breast cancer, s/p lumpectomy and SLNB on 02/04/19, completed radiation, current taking anti estrogen pill    Patient Stated Goals to alleviate some of the soreness, tightness and size of R breast    Currently in Pain? No/denies    Pain Score 0-No pain                     Flowsheet Row Outpatient Rehab from 03/31/2020 in [place]  Lymphedema Life Impact Scale Total Score 7.35 %            [facility] Adult PT Treatment/Exercise - 06/29/20 0001      Manual Therapy   Soft tissue mobilization soft tissue mobilization to pec which decreased sensitivity    Manual Lymphatic Drainage (MLD)  short neck, 5 diaphragmatic breaths, left axillary nodes, Lt intact upper quadrant and establishment of interaxillary pathway, right inguinal nodes and establishment of axillo inguinal pathway, R breast moving fluid towards pathways then retracing all steps                       PT Long Term Goals - 06/29/20 1108      PT LONG TERM GOAL #1   Title Pt will obtain appropriate compression garments for long term management of lymphedema    Baseline 04/29/20- pt obtained her compression bras    Time 4    Period Weeks    Status Achieved      PT LONG TERM GOAL #2   Title Pt will report a 50% decrease in R breast swelling to allow improved comfort.    Baseline 04/29/20- 5% improvement; 05/10/20- 50% improved    Time 4    Period Weeks    Status Achieved      PT LONG TERM GOAL #3   Title Pt will be independent in self MLD for long term management of lymphedema    Baseline 04/29/20- independent in with MLD    Time 4    Period Weeks    Status Achieved      PT LONG TERM GOAL #4   Title Pt will have no tightness in right axilla at end range flexion to allow improved comfort.    Baseline 04/29/20- pt reports that is improving; 05/10/20- pt reports minimal tightness in this area; 06/29/20- pt reports this has improved    Time 4    Period Weeks    Status Achieved                 Plan - 06/29/20 1157    Clinical Impression Statement Reassessed pt's breast swelling after she has been independently managing for a month. Her breast swelling is maintaining and she reports that after she performs MLD the fibrosis in inferior breast softens. She has been performing self MLD 1-2 x /day and also wears her compression bra. Pt is now able to independently manage her lymphedema and will be discharged from skilled PT services at this time.    PT Frequency 1x / week    PT Duration 4 weeks    PT Treatment/Interventions ADLs/Self Care Home Management;Therapeutic exercise;Patient/family  education;Manual lymph drainage;Manual techniques;Compression bandaging;Scar mobilization;Passive range of motion;Vasopneumatic Device    PT Next Visit Plan dc this visit    PT Home Exercise Plan wear chip pack in bra for additional compression; daily self MLD    Consulted and Agree with Plan of  Care Patient           Patient will benefit from skilled therapeutic intervention in order to improve the following deficits and impairments:  Increased edema,Decreased scar mobility,Increased fascial restricitons  Visit Diagnosis: Lymphedema, not elsewhere classified     Problem List Patient Active Problem List   Diagnosis Date Noted  . Age-related osteoporosis without current pathological fracture 04/09/2020  . Genetic testing 08/07/2019  . Family history of prostate cancer   . Family history of uterine cancer   . MTHFR gene mutation 01/16/2019  . Epigastric pain 01/15/2019  . 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 Hypothyroid 11/11/2018  . Memory change 11/13/2016  . Common migraine with intractable migraine 04/06/2016  . H/O partial thyroidectomy 01/14/2014  . Family history of premature CAD 09/01/2013  . History of migraine headaches 02/21/2013  . Hyperlipidemia 02/21/2013  . Thyroid function study abnormality 02/21/2013    Allyson Sabal [facility] 06/29/2020, 12:00 PM  [place], [place], [address] Phone: [phone]   Fax:  [phone]  Name: Maymie Brunke MRN: [id number] Date of Birth: [birth year]  PHYSICAL THERAPY DISCHARGE SUMMARY  Visits from Start of Care: 9  Current functional level related to goals / functional outcomes: All goals met   Remaining deficits: None   Education / Equipment: Self MLD, compression  Plan: Patient agrees to discharge.  Patient goals were met. Patient is being discharged due to  meeting the stated rehab goals.  ?????

## 2020-07-07 ENCOUNTER — Other Ambulatory Visit: Payer: Self-pay | Admitting: Family Medicine

## 2020-07-07 DIAGNOSIS — G43009 Migraine without aura, not intractable, without status migrainosus: Secondary | ICD-10-CM

## 2020-07-08 ENCOUNTER — Telehealth: Payer: Self-pay | Admitting: Family Medicine

## 2020-07-08 ENCOUNTER — Other Ambulatory Visit: Payer: Self-pay | Admitting: Family Medicine

## 2020-07-08 MED ORDER — RIZATRIPTAN BENZOATE 10 MG PO TABS: ORAL_TABLET | ORAL | 0 refills | Status: DC

## 2020-07-08 NOTE — Telephone Encounter (Signed)
Please advise- Maxalt is no longer on med list.

## 2020-07-08 NOTE — Telephone Encounter (Signed)
printed

## 2020-07-08 NOTE — Telephone Encounter (Signed)
Pt called. Rx placed in the front

## 2020-07-08 NOTE — Telephone Encounter (Signed)
Caller : Torii  Call Back # [phone]   Patient is requesting a medication refill, patient states she is going out of town today and would like a paper copy of prescription b/c she is having a hard time finding the medication at the pharmacies.   Medication: rizatriptan (MAXALT) 10 MG tablet [Pharmacy Med Name: Rizatriptan Benzoate 10 MG Oral Tablet]    Has the patient contacted their pharmacy? No. (If no, request that the patient contact the pharmacy for the refill.) (If yes, when and what did the pharmacy advise?)  Preferred Pharmacy (with phone number or street name): N/A  Agent: Please be advised that RX refills may take up to 3 business days. We ask that you follow-up with your pharmacy.

## 2020-07-27 ENCOUNTER — Encounter: Payer: Self-pay | Admitting: Adult Health

## 2020-07-27 DIAGNOSIS — M85852 Other specified disorders of bone density and structure, left thigh: Secondary | ICD-10-CM | POA: Diagnosis not present

## 2020-07-27 DIAGNOSIS — M810 Age-related osteoporosis without current pathological fracture: Secondary | ICD-10-CM | POA: Diagnosis not present

## 2020-08-06 ENCOUNTER — Encounter: Payer: Self-pay | Admitting: Adult Health

## 2020-08-11 ENCOUNTER — Telehealth: Payer: Self-pay

## 2020-08-11 ENCOUNTER — Encounter: Payer: Self-pay | Admitting: Adult Health

## 2020-08-11 NOTE — Telephone Encounter (Signed)
Called to verify bone density appt on 2/1. She had test at [facility]. Called [facility] they will fax report. Fax # given. Patty request a copy of report mailed to her when the office gets the results of bone density.  FYI

## 2020-08-11 NOTE — Telephone Encounter (Signed)
Hey there.  Just FYI, there is a way to print from [place] directly.  Dr. Judeth Porch. [facility] nurses can show you how to log on and do it.  Thanks so much Hassan Rowan!  Mendel Ryder

## 2020-08-13 ENCOUNTER — Telehealth: Payer: Self-pay

## 2020-08-13 NOTE — Telephone Encounter (Signed)
RN spoke with patient informing that bone density results have been received.  Pt request copy mailed, RN mailed copy.   Pt continues on Calcium and Vit D per MD recommendations.  RN encouraged weight bearing exercises/

## 2020-08-19 ENCOUNTER — Encounter: Payer: Self-pay | Admitting: Obstetrics & Gynecology

## 2020-09-12 ENCOUNTER — Other Ambulatory Visit: Payer: Self-pay | Admitting: Family Medicine

## 2020-09-16 DIAGNOSIS — H25013 Cortical age-related cataract, bilateral: Secondary | ICD-10-CM | POA: Diagnosis not present

## 2020-09-16 DIAGNOSIS — H2513 Age-related nuclear cataract, bilateral: Secondary | ICD-10-CM | POA: Diagnosis not present

## 2020-09-16 DIAGNOSIS — H5711 Ocular pain, right eye: Secondary | ICD-10-CM | POA: Diagnosis not present

## 2020-09-16 DIAGNOSIS — D3132 Benign neoplasm of left choroid: Secondary | ICD-10-CM | POA: Diagnosis not present

## 2020-09-16 DIAGNOSIS — H04123 Dry eye syndrome of bilateral lacrimal glands: Secondary | ICD-10-CM | POA: Diagnosis not present

## 2020-10-04 ENCOUNTER — Ambulatory Visit (HOSPITAL_BASED_OUTPATIENT_CLINIC_OR_DEPARTMENT_OTHER): Payer: Medicare Other | Admitting: Obstetrics & Gynecology

## 2020-10-15 DIAGNOSIS — D2272 Melanocytic nevi of left lower limb, including hip: Secondary | ICD-10-CM | POA: Diagnosis not present

## 2020-10-15 DIAGNOSIS — L814 Other melanin hyperpigmentation: Secondary | ICD-10-CM | POA: Diagnosis not present

## 2020-10-15 DIAGNOSIS — D224 Melanocytic nevi of scalp and neck: Secondary | ICD-10-CM | POA: Diagnosis not present

## 2020-10-15 DIAGNOSIS — L821 Other seborrheic keratosis: Secondary | ICD-10-CM | POA: Diagnosis not present

## 2020-10-15 DIAGNOSIS — D225 Melanocytic nevi of trunk: Secondary | ICD-10-CM | POA: Diagnosis not present

## 2020-10-15 DIAGNOSIS — Z85828 Personal history of other malignant neoplasm of skin: Secondary | ICD-10-CM | POA: Diagnosis not present

## 2020-10-15 DIAGNOSIS — D2271 Melanocytic nevi of right lower limb, including hip: Secondary | ICD-10-CM | POA: Diagnosis not present

## 2020-10-18 ENCOUNTER — Ambulatory Visit: Payer: Medicare Other

## 2020-11-08 ENCOUNTER — Other Ambulatory Visit (HOSPITAL_BASED_OUTPATIENT_CLINIC_OR_DEPARTMENT_OTHER)
Admission: RE | Admit: 2020-11-08 | Discharge: 2020-11-08 | Disposition: A | Discharge status: Home / Self Care | Payer: Medicare Other | Source: Ambulatory Visit | Attending: Obstetrics & Gynecology | Admitting: Obstetrics & Gynecology

## 2020-11-08 ENCOUNTER — Ambulatory Visit (INDEPENDENT_AMBULATORY_CARE_PROVIDER_SITE_OTHER): Payer: Medicare Other | Admitting: Obstetrics & Gynecology

## 2020-11-08 ENCOUNTER — Other Ambulatory Visit: Payer: Self-pay

## 2020-11-08 ENCOUNTER — Encounter (HOSPITAL_BASED_OUTPATIENT_CLINIC_OR_DEPARTMENT_OTHER): Payer: Self-pay | Admitting: Obstetrics & Gynecology

## 2020-11-08 VITALS — BP 126/76 | HR 75 | Ht <= 58 in | Wt 110.0 lb

## 2020-11-08 DIAGNOSIS — E039 Hypothyroidism, unspecified: Secondary | ICD-10-CM | POA: Insufficient documentation

## 2020-11-08 DIAGNOSIS — C50311 Malignant neoplasm of lower-inner quadrant of right female breast: Secondary | ICD-10-CM

## 2020-11-08 DIAGNOSIS — Z170 Estrogen receptor positive status [ER+]: Secondary | ICD-10-CM | POA: Diagnosis not present

## 2020-11-08 DIAGNOSIS — Z9189 Other specified personal risk factors, not elsewhere classified: Secondary | ICD-10-CM

## 2020-11-08 DIAGNOSIS — M810 Age-related osteoporosis without current pathological fracture: Secondary | ICD-10-CM

## 2020-11-08 DIAGNOSIS — Z1211 Encounter for screening for malignant neoplasm of colon: Secondary | ICD-10-CM | POA: Diagnosis not present

## 2020-11-08 LAB — T4, FREE: Free T4: 0.78 ng/dL (ref 0.61–1.12)

## 2020-11-08 LAB — TSH: TSH: 3.325 u[IU]/mL (ref 0.350–4.500)

## 2020-11-08 NOTE — Progress Notes (Signed)
65 y.o. G2P2 Married Declined female here for breast and pelvic exam.  Doing well.  Diagnosed with breast cancer 11/2018.  T2N0 Stage 1B. Took anastrozole for 1 month pre-op and then right lumpectomy 01/2019.  2.5cm with neg margins and 3 negative lymph nodes.  Now on letrozole.    ER/PR positive, her 2 negative.  radiation 9/29 -10/19.   Had BMD 07/2020 that was improved from 11/20/2019.  Going to [employer] and calcium supplement.    Denies vaginal bleeding.  Had recent eye exam.  Pt has been experiencing eye pain.  Provider suggested she have thyroid testing completed.    Patient's last menstrual period was 08/25/2010.          Sexually active: Yes.    H/O STD:  no  Health Maintenance: PCP:  [name].  Last wellness appt was 03/31/2020.  Did blood work at that appt:  Yes Vaccines are up to date:  Did not do Covid vaccination.  Declines pneumonia vaccination. Colonoscopy:  cologuard 2018 MMG:  Due in July, 2022 BMD:  07/2020 Last pap smear:  12/2018 negative.   H/o abnormal pap smear:  no    reports that she has never smoked. She has never used smokeless tobacco. She reports previous alcohol use. She reports that she does not use drugs.  Past Medical History:  Diagnosis Date  . Anxiety   . Arthritis   . Cancer ([place]) 11/2018   right breast cancer  . Common migraine with intractable migraine 04/06/2016  . Complication of anesthesia   . Diverticulosis   . Elevated LFTs    monitored by pcp per pt --  unknown etiology  . Family history of premature CAD 09/01/2013  . Family history of prostate cancer   . Family history of uterine cancer   . History of basal cell carcinoma excision    2004   &  2014  . History of colitis   . History of palpitations   . 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 IBS (irritable bowel syndrome)   . Migraine   . MTHFR gene mutation   . Personal history of radiation therapy    . PMB (postmenopausal bleeding)   . PONV (postoperative nausea and vomiting)   . Post-surgical hypothyroidism     Past Surgical History:  Procedure Laterality Date  . APPENDECTOMY  as child  . BREAST BIOPSY Left 1979   benign  . BREAST EXCISIONAL BIOPSY Left   . BREAST LUMPECTOMY Right 01/2019  . BREAST LUMPECTOMY WITH RADIOACTIVE SEED AND SENTINEL LYMPH NODE BIOPSY Right 02/04/2019   Procedure: RIGHT BREAST LUMPECTOMY WITH RADIOACTIVE SEED AND RIGHT AXILLARY DEEP SENTINEL LYMPH NODE BIOPSY WITH BLUE DYE INJECTION;  Surgeon: Fanny Skates, MD;  Location: [facility];  Service: General;  Laterality: Right;  . BREAST REDUCTION SURGERY Bilateral 02/11/2019   Procedure: RIGHT BREAST ONCOPLASTIC RECONSTRUCTION, LEFT BREAST REDUCTION;  Surgeon: Irene Limbo, MD;  Location: [facility];  Service: Plastics;  Laterality: Bilateral;  . CARDIOVASCULAR STRESS TEST  09-16-2013   normal nuclear study w/ no ischemia/  normal LV function and wall motion , ef 84%  . CESAREAN SECTION  1977 and 1978   Bilateral Tubal Ligation w/ last c/s  . COLPOSCOPY  05/2009   CIN 1  . HYSTEROSCOPY WITH D & C N/A 12/23/2015   Procedure: DILATATION AND CURETTAGE /HYSTEROSCOPY ;  Surgeon: Stanton Kidney  Ammie Ferrier, MD;  Location: [facility];  Service: Gynecology;  Laterality: N/A;  . NEGATIVE SLEEP STUDY  2014  per pt  . OOPHORECTOMY Right 1985   ruptured cyst   . REDUCTION MAMMAPLASTY Bilateral 01/2019  . THYROID LOBECTOMY Right 03-08-2011  . TRANSTHORACIC ECHOCARDIOGRAM  05-09-2011   ef 64%/  mild MR and TR  . TUBAL LIGATION      Current Outpatient Medications  Medication Sig Dispense Refill  . Ascorbic Acid (VITAMIN C) 1000 MG tablet Take 1,000 mg by mouth daily. 2 tablet daily    . ASTAXANTHIN PO Take by mouth.    . Cholecalciferol (VITAMIN D3) 5000 UNITS CAPS Take 1 capsule by mouth daily.    . Melatonin 3 MG TABS Take 10 mg by mouth at bedtime.     . Multiple  Vitamins-Minerals (MULTIVITAMIN WITH MINERALS) tablet Take 1 tablet by mouth daily.    . cyclobenzaprine (FLEXERIL) 5 MG tablet Take 1 tablet by mouth daily as needed.  (Patient not taking: Reported on 11/08/2020)    . iodine-sodium iodide 2-2.4 % solution Apply topically once.  (Patient not taking: Reported on 11/08/2020)    . letrozole (FEMARA) 2.5 MG tablet Take 1 tablet (2.5 mg total) by mouth daily. (Patient not taking: No sig reported) 90 tablet 3  . levothyroxine (EUTHYROX) 75 MCG tablet Take 1 tablet (75 mcg total) by mouth daily before breakfast. (Patient not taking: Reported on 11/08/2020) 30 tablet 0  . rizatriptan (MAXALT) 10 MG tablet TAKE 1 TABLET (10 MG TOTAL) BY MOUTH AS NEEDED FOR MIGRAINE. MAY REPEAT IN 2 HOURS IF NEEDED (Patient not taking: Reported on 11/08/2020) 10 tablet 0  . zinc gluconate 50 MG tablet Take 15 mg by mouth daily.  (Patient not taking: Reported on 11/08/2020)    . zolpidem (AMBIEN) 5 MG tablet Take 1 tablet (5 mg total) by mouth at bedtime as needed for sleep. (Patient not taking: Reported on 11/08/2020) 30 tablet 0   No current facility-administered medications for this visit.    Family History  Problem Relation Age of Onset  . Hypertension Maternal Grandmother   . Uterine cancer Maternal Grandmother 29  . Vascular Disease Maternal Grandmother   . Lung cancer Maternal Grandmother 74  . Stroke Maternal Grandfather   . Prostate cancer Paternal Grandfather 50  . Migraines Paternal Grandmother   . Lung cancer Mother 48  . Hypertension Mother 28  . Hyperlipidemia Mother 54  . Ehlers-Danlos syndrome Mother   . Kidney disease Mother   . Fibromyalgia Mother   . Other Father        bypass surgery times 2  . Migraines Father   . Osteoporosis Father   . Prostate cancer Father   . Kidney disease Maternal Aunt     Review of Systems  All other systems reviewed and are negative.   Exam:   BP 126/76   Pulse 75   Ht 4\' 10"  (1.473 m)   Wt 110 lb (49.9 kg)    LMP 08/25/2010   BMI 22.99 kg/m   Height: 4\' 10"  (147.3 cm)  General appearance: alert, cooperative and appears stated age Breasts: normal appearance, no masses or tenderness Abdomen: soft, non-tender; bowel sounds normal; no masses,  no organomegaly Lymph nodes: Cervical, supraclavicular, and axillary nodes normal.  No abnormal inguinal nodes palpated Neurologic: Grossly normal  Pelvic: External genitalia:  no lesions              Urethra:  normal appearing  urethra with no masses, tenderness or lesions              Bartholins and Skenes: normal                 Vagina: normal appearing vagina with atrophic changes and no discharge, no lesions              Cervix: no lesions              Pap taken: No. Bimanual Exam:  Uterus:  normal size, contour, position, consistency, mobility, non-tender              Adnexa: normal adnexa and no mass, fullness, tenderness               Rectovaginal: Confirms               Anus:  normal sphincter tone, no lesions  Chaperone, Octaviano Batty, CMA, was present for exam.  Assessment/Plan: 1. GYN exam for high-risk Medicare patient - pap 12/2018 negative.  Not indicated today. - MMG/MRI up to date - BMD 07/2020 - lab work with Dr. Etter Sjogren - vaccines updated  2. 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 Age-related osteoporosis without current pathological fracture  4. Acquired hypothyroidism - TSH; Future - T4, free; Future  5. Colon cancer screening - Cologuard

## 2020-11-17 ENCOUNTER — Other Ambulatory Visit: Payer: Self-pay | Admitting: Family Medicine

## 2020-12-03 ENCOUNTER — Other Ambulatory Visit: Payer: Self-pay | Admitting: Hematology and Oncology

## 2020-12-03 DIAGNOSIS — Z853 Personal history of malignant neoplasm of breast: Secondary | ICD-10-CM

## 2020-12-03 DIAGNOSIS — Z9889 Other specified postprocedural states: Secondary | ICD-10-CM

## 2020-12-07 DIAGNOSIS — Z1212 Encounter for screening for malignant neoplasm of rectum: Secondary | ICD-10-CM | POA: Diagnosis not present

## 2020-12-07 DIAGNOSIS — Z1211 Encounter for screening for malignant neoplasm of colon: Secondary | ICD-10-CM | POA: Diagnosis not present

## 2020-12-08 ENCOUNTER — Other Ambulatory Visit: Payer: Self-pay | Admitting: Obstetrics & Gynecology

## 2020-12-14 LAB — COLOGUARD: COLOGUARD: NEGATIVE

## 2020-12-15 ENCOUNTER — Encounter (HOSPITAL_BASED_OUTPATIENT_CLINIC_OR_DEPARTMENT_OTHER): Payer: Self-pay | Admitting: Obstetrics & Gynecology

## 2020-12-15 LAB — COLOGUARD

## 2020-12-21 ENCOUNTER — Inpatient Hospital Stay: Payer: Medicare Other | Attending: Hematology and Oncology | Admitting: Hematology and Oncology

## 2020-12-21 ENCOUNTER — Other Ambulatory Visit: Payer: Self-pay

## 2020-12-21 DIAGNOSIS — M810 Age-related osteoporosis without current pathological fracture: Secondary | ICD-10-CM | POA: Diagnosis not present

## 2020-12-21 DIAGNOSIS — Z923 Personal history of irradiation: Secondary | ICD-10-CM | POA: Diagnosis not present

## 2020-12-21 DIAGNOSIS — C50311 Malignant neoplasm of lower-inner quadrant of right female breast: Secondary | ICD-10-CM | POA: Diagnosis not present

## 2020-12-21 DIAGNOSIS — Z170 Estrogen receptor positive status [ER+]: Secondary | ICD-10-CM

## 2020-12-21 MED ORDER — EXEMESTANE 25 MG PO TABS: 12.5000 mg | ORAL_TABLET | Freq: Every day | ORAL | 0 refills | Status: DC

## 2020-12-21 NOTE — Progress Notes (Signed)
Patient Care Team: Carollee Herter, Alferd Apa, DO as PCP - General (Family Medicine) Orie Rout, MD as Referring Physician (Specialist) Elveria Rising, MD as Consulting Physician (Obstetrics and Gynecology) Izora Gala, MD as Consulting Physician (Otolaryngology) Megan Salon, MD as Consulting Physician (Gynecology) Nicholas Lose, MD as Consulting Physician (Hematology and Oncology) Eppie Gibson, MD as Attending Physician (Radiation Oncology) Irene Limbo, MD as Consulting Physician (Plastic Surgery) Fanny Skates, MD as Consulting Physician (General Surgery)  DIAGNOSIS:  Encounter Diagnosis  Name Primary?   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, estrogen receptor positive ([place])  12/24/2018 Initial Diagnosis   Palpable lump in the right breast, 2.1 cm, biopsy revealed grade 2 invasive ductal carcinoma that was ER 100%, PR 50%, Ki-67 50%, HER-2 negative, 2+ by IHC FISH ratio 1.27, T2N0 stage IB   12/24/2018 Cancer Staging   Staging form: Breast, AJCC 8th Edition - Clinical: Stage IB (cT2, cN0, cM0, G2, ER+, PR+, HER2-) - Signed by Eppie Gibson, MD on 12/24/2018    11/2018 - 11/2023 Anti-estrogen oral therapy   Anastrozole 1 mg   02/04/2019 Surgery   Right lumpectomy [facility]): IDC, 2.5cm, grade 1, clear margins, and 3 lymph nodes negative.   02/04/2019 Cancer Staging   Staging form: Breast, AJCC 8th Edition - Pathologic stage from 02/04/2019: Stage IA (pT2, pN0, cM0, G1, ER+, PR+, HER2-, Oncotype DX score: 18) - Signed by Gardenia Phlegm, NP on 02/19/2019    02/11/2019 Surgery   Bil Mammaplasty: benign   03/24/2019 - 04/14/2019 Radiation Therapy   The patient initially received a dose of 42.56 Gy in 16 fractions to the breast using whole-breast tangent fields. This was delivered using a 3-D  conformal technique. The total dose was 42.56 Gy.    Oncotype testing   The Oncotype DX score was 18 predicting a risk of outside the breast recurrence over the next 9 years of 5 % if the patient's only systemic therapy is tamoxifen for 5 years.   08/06/2019 Genetic Testing   Negative genetic testing on the common hereditary cancer panel.  The Common Hereditary Gene Panel offered by [facility] includes sequencing and/or deletion duplication testing of the following 48 genes: APC, ATM, AXIN2, BARD1, BMPR1A, BRCA1, BRCA2, BRIP1, CDH1, CDK4, CDKN2A (p14ARF), CDKN2A (p16INK4a), CHEK2, CTNNA1, DICER1, EPCAM (Deletion/duplication testing only), GREM1 (promoter region deletion/duplication testing only), KIT, MEN1, MLH1, MSH2, MSH3, MSH6, MUTYH, NBN, NF1, NHTL1, PALB2, PDGFRA, PMS2, POLD1, POLE, PTEN, RAD50, RAD51C, RAD51D, RNF43, SDHB, SDHC, SDHD, SMAD4, SMARCA4. STK11, TP53, TSC1, TSC2, and VHL.  The following genes were evaluated for sequence changes only: SDHA and HOXB13 c.251G>A variant only. The report date is August 06, 2019.     CHIEF COMPLIANT: Follow-up of right breast cancer on letrozole therapy  INTERVAL HISTORY: Annarae Macnair is a 65 year old with above-mentioned history of right breast cancer diagnosed in 2020.  She underwent right lumpectomy followed by radiation and is currently on oral antiestrogen therapy with letrozole.  She is tolerating it fairly well.  Denies any lumps or nodules in the breast.  She could not tolerate anastrozole therapy.  She went on letrozole half a tablet daily.  Unfortunately she is starting to develop symptoms related to joint aches and stiffness.    ALLERGIES:  is allergic to codeine, demerol [meperidine], erythromycin, sulfa antibiotics, ampicillin, and chlorhexidine.  MEDICATIONS:  Current Outpatient  Medications  Medication Sig Dispense Refill   Ascorbic Acid (VITAMIN C) 1000 MG tablet Take 1,000 mg by mouth daily. 2 tablet daily     ASTAXANTHIN PO  Take by mouth.     Cholecalciferol (VITAMIN D3) 5000 UNITS CAPS Take 1 capsule by mouth daily.     cyclobenzaprine (FLEXERIL) 5 MG tablet Take 1 tablet by mouth daily as needed.  (Patient not taking: Reported on 11/08/2020)     iodine-sodium iodide 2-2.4 % solution Apply topically once.  (Patient not taking: Reported on 11/08/2020)     letrozole (FEMARA) 2.5 MG tablet Take 1 tablet (2.5 mg total) by mouth daily. (Patient not taking: No sig reported) 90 tablet 3   levothyroxine (EUTHYROX) 75 MCG tablet Take 1 tablet (75 mcg total) by mouth daily before breakfast. (Patient not taking: Reported on 11/08/2020) 30 tablet 0   Melatonin 3 MG TABS Take 10 mg by mouth at bedtime.      Multiple Vitamins-Minerals (MULTIVITAMIN WITH MINERALS) tablet Take 1 tablet by mouth daily.     rizatriptan (MAXALT) 10 MG tablet TAKE 1 TABLET (10 MG TOTAL) BY MOUTH AS NEEDED FOR MIGRAINE. MAY REPEAT IN 2 HOURS IF NEEDED (Patient not taking: Reported on 11/08/2020) 10 tablet 0   zinc gluconate 50 MG tablet Take 15 mg by mouth daily.  (Patient not taking: Reported on 11/08/2020)     zolpidem (AMBIEN) 5 MG tablet Take 1 tablet (5 mg total) by mouth at bedtime as needed for sleep. (Patient not taking: Reported on 11/08/2020) 30 tablet 0   No current facility-administered medications for this visit.    PHYSICAL EXAMINATION: ECOG PERFORMANCE STATUS: 1 - Symptomatic but completely ambulatory  Vitals:   12/21/20 0955  BP: 105/64  Pulse: 65  Resp: (!) 8  Temp: 97.7 F (36.5 C)  SpO2: 100%   Filed Weights   12/21/20 0955  Weight: 111 lb 1.6 oz (50.4 kg)     LABORATORY DATA:  I have reviewed the data as listed CMP Latest Ref Rng & Units 04/09/2020 10/03/2019 12/30/2018  Glucose 65 - 99 mg/dL - 84 84  BUN 8 - 27 mg/dL - 7(L) 7(L)  Creatinine 0.57 - 1.00 mg/dL - 0.58 0.66  Sodium 134 - 144 mmol/L - 142 140  Potassium 3.5 - 5.2 mmol/L - 4.0 3.8  Chloride 96 - 106 mmol/L - 103 100  CO2 20 - 29 mmol/L - 25 24  Calcium 8.7 -  10.3 mg/dL - 9.1 9.3  Total Protein 6.1 - 8.1 g/dL 6.5 6.4 6.3  Total Bilirubin 0.2 - 1.2 mg/dL 0.4 0.3 0.2  Alkaline Phos 39 - 117 IU/L - 89 71  AST 10 - 35 U/L 26 33 19  ALT 6 - 29 U/L 28 41(H) 31    Lab Results  Component Value Date   WBC 5.9 03/22/2017   HGB 14.4 03/22/2017   HCT 43.3 03/22/2017   MCV 97.8 03/22/2017   PLT 210.0 03/22/2017   NEUTROABS 4.0 03/22/2017    ASSESSMENT & PLAN:  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  ER PR positive HER-2 negative, Ki-67 50%   Adjuvant radiation 03/25/2019-04/13/20 ----------------------------------------------------------------- Treatment plan: Adjuvant antiestrogen therapy with anastrozole 1 mg p.o. daily decrease to half a tablet daily 12/18/2019 discontinued because of continued muscle aches and pains Switched to letrozole 02/25/2020 half a tablet daily   Letrozole toxicities: For 6 months she did very well at half a tablet but then started to develop aches and pains. Plan: Switch to exemestane half a tablet daily.   Breast cancer surveillance: Both mammograms and breast MRIs because only breast MRI picked up her cancer. Mammogram 01/06/2020 no mammographic evidence of malignancy breast density category C Breast MRI 05/2020: no malignancy Bone density 12/02/2019; T score -3    Osteoporosis: Patient wants to try osteogenic's exercise/specialized equipment program to improve bone density.  Telephone visit in 1  month to discuss tolerance to exemestane.   No orders of the defined types were placed in this encounter.  The patient has a good understanding of the overall plan. she agrees with it. she will call with any problems that may develop before the next visit here. Total time spent: 30 mins including face to face time and time spent for planning, charting and co-ordination of care   Harriette Ohara, MD 12/21/20

## 2020-12-21 NOTE — Assessment & Plan Note (Signed)
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 dailydecrease to half a tablet daily 12/18/2019 discontinued because of continued muscle aches and pains Switched to letrozole 02/25/2020 half a tablet daily  Letrozole toxicities:   Breast cancer surveillance: Both mammograms and breast MRIs because only breast MRI picked up her cancer. Mammogram 01/06/2020 no mammographic evidence of malignancy breast density category C Breast MRI 05/2020: no malignancy Bone density6/01/2020; T score -3  Osteoporosis:Patient wants to try osteogenic's exercise/specialized equipment program to improve bone density.  Return to clinic in 1 year for follow-up

## 2021-01-06 ENCOUNTER — Other Ambulatory Visit: Payer: Self-pay

## 2021-01-06 ENCOUNTER — Ambulatory Visit
Admission: RE | Admit: 2021-01-06 | Discharge: 2021-01-06 | Disposition: A | Discharge status: Home / Self Care | Payer: Medicare Other | Source: Ambulatory Visit | Attending: Hematology and Oncology | Admitting: Hematology and Oncology

## 2021-01-06 DIAGNOSIS — R922 Inconclusive mammogram: Secondary | ICD-10-CM | POA: Diagnosis not present

## 2021-01-06 DIAGNOSIS — Z853 Personal history of malignant neoplasm of breast: Secondary | ICD-10-CM

## 2021-01-12 DIAGNOSIS — G8929 Other chronic pain: Secondary | ICD-10-CM | POA: Diagnosis not present

## 2021-01-12 DIAGNOSIS — M25561 Pain in right knee: Secondary | ICD-10-CM | POA: Diagnosis not present

## 2021-01-12 DIAGNOSIS — M222X1 Patellofemoral disorders, right knee: Secondary | ICD-10-CM | POA: Diagnosis not present

## 2021-01-25 ENCOUNTER — Inpatient Hospital Stay: Payer: Medicare Other | Admitting: Hematology and Oncology

## 2021-02-02 DIAGNOSIS — M222X1 Patellofemoral disorders, right knee: Secondary | ICD-10-CM | POA: Diagnosis not present

## 2021-02-08 DIAGNOSIS — M222X1 Patellofemoral disorders, right knee: Secondary | ICD-10-CM | POA: Diagnosis not present

## 2021-02-10 DIAGNOSIS — Z20822 Contact with and (suspected) exposure to covid-19: Secondary | ICD-10-CM | POA: Diagnosis not present

## 2021-02-11 DIAGNOSIS — Z20822 Contact with and (suspected) exposure to covid-19: Secondary | ICD-10-CM | POA: Diagnosis not present

## 2021-02-13 DIAGNOSIS — Z20822 Contact with and (suspected) exposure to covid-19: Secondary | ICD-10-CM | POA: Diagnosis not present

## 2021-02-22 DIAGNOSIS — M222X1 Patellofemoral disorders, right knee: Secondary | ICD-10-CM | POA: Diagnosis not present

## 2021-02-23 NOTE — Progress Notes (Signed)
HEMATOLOGY-ONCOLOGY TELEPHONE VISIT PROGRESS NOTE  I connected with [name] on 02/24/2021 at  9:00 AM EDT by telephone and verified that I am speaking with the correct person using two identifiers.  I discussed the limitations, risks, security and privacy concerns of performing an evaluation and management service by telephone and the availability of in person appointments.  I also discussed with the patient that there may be a patient responsible charge related to this service. The patient expressed understanding and agreed to proceed.   History of Present Illness: Kimberly Miles is a 65 y.o. female with above-mentioned history of right breast cancer diagnosed in 2020.  She underwent right lumpectomy followed by radiation and is currently on oral antiestrogen therapy with letrozole. Mammogram on 01/06/2021 showed no evidence malignancy. She presents via telephone today for follow-up.   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, estrogen receptor positive ([place])  12/24/2018 Initial Diagnosis   Palpable lump in the right breast, 2.1 cm, 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   02/04/2019 Cancer Staging   Staging form: Breast, AJCC 8th Edition - Pathologic stage from 02/04/2019: Stage IA (pT2, pN0, cM0, G1, ER+, PR+, HER2-, Oncotype DX score: 18) - Signed by Gardenia Phlegm, NP on 02/19/2019   02/11/2019 Surgery   Bil Mammaplasty: benign   03/24/2019 - 04/14/2019 Radiation Therapy   The  patient initially received a dose of 42.56 Gy in 16 fractions to the breast using whole-breast tangent fields. This was delivered using a 3-D conformal technique. The total dose was 42.56 Gy.    Oncotype testing   The Oncotype DX score was 18 predicting a risk of outside the breast recurrence over the next 9 years of 5 % if the patient's only systemic therapy is tamoxifen for 5 years.   08/06/2019 Genetic Testing   Negative genetic testing on the common hereditary cancer panel.  The Common Hereditary Gene Panel offered by [facility] includes sequencing and/or deletion duplication testing of the following 48 genes: APC, ATM, AXIN2, BARD1, BMPR1A, BRCA1, BRCA2, BRIP1, CDH1, CDK4, CDKN2A (p14ARF), CDKN2A (p16INK4a), CHEK2, CTNNA1, DICER1, EPCAM (Deletion/duplication testing only), GREM1 (promoter region deletion/duplication testing only), KIT, MEN1, MLH1, MSH2, MSH3, MSH6, MUTYH, NBN, NF1, NHTL1, PALB2, PDGFRA, PMS2, POLD1, POLE, PTEN, RAD50, RAD51C, RAD51D, RNF43, SDHB, SDHC, SDHD, SMAD4, SMARCA4. STK11, TP53, TSC1, TSC2, and VHL.  The following genes were evaluated for sequence changes only: SDHA and HOXB13 c.251G>A variant only. The report date is August 06, 2019.     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  ER PR positive HER-2 negative, Ki-67 50%   Adjuvant radiation  03/25/2019-04/13/20 ----------------------------------------------------------------- Treatment plan: Adjuvant antiestrogen therapy with anastrozole 12/18/2019 discontinued because of continued muscle aches and pains Switched to letrozole 02/25/2020 half a tablet daily, switched to exemestane 01/24/21   Exemestane toxicities: Tolerating it extremely well currently.  She is anxious because previously with letrozole she did well for 63-monthbefore she started noticing side effects.   Breast cancer surveillance:  1. Mammogram 01/06/2021 no mammographic evidence of malignancy breast density category B 2. Breast MRI 05/2020: no malignancy (will obtain annually because of breast cancer was only detected through an MRI) 3. Bone density 12/02/2019; T score -3 (takes osteostrong)   Osteoporosis: Patient wants to try osteogenic's exercise/specialized equipment program to improve bone density.   We will order a breast MRI to be done in December.  I discussed the assessment and treatment plan with the patient. The patient was provided an opportunity to ask questions and all were answered. The patient agreed with the plan and demonstrated an understanding of the instructions. The patient was advised to call back or seek an in-person evaluation if the symptoms worsen or if the condition fails to improve as anticipated.   Total time spent: 20 mins including non-face to face time and time spent for planning, charting and coordination of care  VRulon Eisenmenger MD 02/24/2021    I, KThana Ates am acting as scribe for VNicholas Lose MD.  I have reviewed the above documentation for accuracy and completeness, and I agree with the above.

## 2021-02-24 ENCOUNTER — Inpatient Hospital Stay: Payer: Medicare Other | Attending: Hematology and Oncology | Admitting: Hematology and Oncology

## 2021-02-24 DIAGNOSIS — Z170 Estrogen receptor positive status [ER+]: Secondary | ICD-10-CM

## 2021-02-24 DIAGNOSIS — Z780 Asymptomatic menopausal state: Secondary | ICD-10-CM | POA: Diagnosis not present

## 2021-02-24 DIAGNOSIS — C50311 Malignant neoplasm of lower-inner quadrant of right female breast: Secondary | ICD-10-CM

## 2021-02-24 NOTE — Assessment & Plan Note (Signed)
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%, Ki-67 50%, HER-2 negative, 2+ by IHC FISH ratio 1.27, Breast MRI: 2.3 cm right lower quadrant enhancement Left breast biopsy: Fibroadenoma Oncotype score 18: Distant recurrence at 9 years: 5% T2N0 stage IB Preoperative anastrozole for 1 month  Right lumpectomy: 02/04/2019([name]): IDC, 2.5cm, grade 1, clear margins, and 3 lymph nodes negative.ER PR positive HER-2 negative, Ki-67 50%  Adjuvant radiation 03/25/2019-04/13/20 ----------------------------------------------------------------- Treatment plan: Adjuvant antiestrogen therapy with anastrozole 12/18/2019 discontinued because of continued muscle aches and pains Switched to letrozole 02/25/2020 half a tablet daily, switched to exemestane 12/21/2020  Exemestane toxicities:    Breast cancer surveillance:  1. Mammogram 01/06/2021 no mammographic evidence of malignancy breast density category B 2. Breast MRI12/2021: no malignancy (will obtain annually because of breast cancer was only detected through an MRI) 3. Bone density6/01/2020; T score -3  Osteoporosis:Patient wants to try osteogenic's exercise/specialized equipment program to improve bone density.  We will order a breast MRI to be done in December. 

## 2021-02-25 ENCOUNTER — Telehealth: Payer: Self-pay | Admitting: Hematology and Oncology

## 2021-02-25 DIAGNOSIS — M222X1 Patellofemoral disorders, right knee: Secondary | ICD-10-CM | POA: Diagnosis not present

## 2021-02-25 NOTE — Telephone Encounter (Signed)
Scheduled appt per 9/1 LOS - 1 yr f/u . Mailed reminder letter with appt date and time

## 2021-03-02 DIAGNOSIS — M222X1 Patellofemoral disorders, right knee: Secondary | ICD-10-CM | POA: Diagnosis not present

## 2021-03-04 DIAGNOSIS — M222X1 Patellofemoral disorders, right knee: Secondary | ICD-10-CM | POA: Diagnosis not present

## 2021-04-13 ENCOUNTER — Other Ambulatory Visit: Payer: Self-pay | Admitting: *Deleted

## 2021-04-13 DIAGNOSIS — C50311 Malignant neoplasm of lower-inner quadrant of right female breast: Secondary | ICD-10-CM

## 2021-04-13 DIAGNOSIS — Z780 Asymptomatic menopausal state: Secondary | ICD-10-CM

## 2021-04-13 MED ORDER — EXEMESTANE 25 MG PO TABS: 12.5000 mg | ORAL_TABLET | Freq: Every day | ORAL | 1 refills | Status: DC

## 2021-04-26 ENCOUNTER — Ambulatory Visit (INDEPENDENT_AMBULATORY_CARE_PROVIDER_SITE_OTHER): Payer: Medicare Other | Admitting: Family Medicine

## 2021-04-26 ENCOUNTER — Other Ambulatory Visit: Payer: Self-pay

## 2021-04-26 ENCOUNTER — Encounter: Payer: Self-pay | Admitting: Family Medicine

## 2021-04-26 VITALS — BP 110/60 | HR 70 | Temp 98.1°F | Resp 18 | Ht <= 58 in | Wt 112.8 lb

## 2021-04-26 DIAGNOSIS — E785 Hyperlipidemia, unspecified: Secondary | ICD-10-CM

## 2021-04-26 DIAGNOSIS — R1030 Lower abdominal pain, unspecified: Secondary | ICD-10-CM | POA: Diagnosis not present

## 2021-04-26 DIAGNOSIS — E559 Vitamin D deficiency, unspecified: Secondary | ICD-10-CM | POA: Diagnosis not present

## 2021-04-26 DIAGNOSIS — R140 Abdominal distension (gaseous): Secondary | ICD-10-CM

## 2021-04-26 DIAGNOSIS — R051 Acute cough: Secondary | ICD-10-CM | POA: Diagnosis not present

## 2021-04-26 DIAGNOSIS — R1031 Right lower quadrant pain: Secondary | ICD-10-CM | POA: Insufficient documentation

## 2021-04-26 DIAGNOSIS — E039 Hypothyroidism, unspecified: Secondary | ICD-10-CM

## 2021-04-26 LAB — CBC WITH DIFFERENTIAL/PLATELET
Basophils Absolute: 0 10*3/uL (ref 0.0–0.1)
Basophils Relative: 0.6 % (ref 0.0–3.0)
Eosinophils Absolute: 0.1 10*3/uL (ref 0.0–0.7)
Eosinophils Relative: 1.3 % (ref 0.0–5.0)
HCT: 42.4 % (ref 36.0–46.0)
Hemoglobin: 14 g/dL (ref 12.0–15.0)
Lymphocytes Relative: 26.4 % (ref 12.0–46.0)
Lymphs Abs: 1.4 10*3/uL (ref 0.7–4.0)
MCHC: 32.9 g/dL (ref 30.0–36.0)
MCV: 96.9 fl (ref 78.0–100.0)
Monocytes Absolute: 0.4 10*3/uL (ref 0.1–1.0)
Monocytes Relative: 7.2 % (ref 3.0–12.0)
Neutro Abs: 3.5 10*3/uL (ref 1.4–7.7)
Neutrophils Relative %: 64.5 % (ref 43.0–77.0)
Platelets: 211 10*3/uL (ref 150.0–400.0)
RBC: 4.37 Mil/uL (ref 3.87–5.11)
RDW: 12.6 % (ref 11.5–15.5)
WBC: 5.4 10*3/uL (ref 4.0–10.5)

## 2021-04-26 LAB — COMPREHENSIVE METABOLIC PANEL
ALT: 31 U/L (ref 0–35)
AST: 19 U/L (ref 0–37)
Albumin: 4.4 g/dL (ref 3.5–5.2)
Alkaline Phosphatase: 79 U/L (ref 39–117)
BUN: 11 mg/dL (ref 6–23)
CO2: 30 mEq/L (ref 19–32)
Calcium: 9.4 mg/dL (ref 8.4–10.5)
Chloride: 105 mEq/L (ref 96–112)
Creatinine, Ser: 0.69 mg/dL (ref 0.40–1.20)
GFR: 89.64 mL/min (ref >60.00)
Glucose, Bld: 93 mg/dL (ref 70–99)
Potassium: 5.1 mEq/L (ref 3.5–5.1)
Sodium: 141 mEq/L (ref 135–145)
Total Bilirubin: 0.6 mg/dL (ref 0.2–1.2)
Total Protein: 6.2 g/dL (ref 6.0–8.3)

## 2021-04-26 LAB — POC URINALSYSI DIPSTICK (AUTOMATED)
Bilirubin, UA: NEGATIVE
Blood, UA: NEGATIVE
Glucose, UA: NEGATIVE
Ketones, UA: NEGATIVE
Leukocytes, UA: NEGATIVE
Nitrite, UA: NEGATIVE
Protein, UA: NEGATIVE
Spec Grav, UA: 1.015 (ref 1.010–1.025)
Urobilinogen, UA: 0.2 E.U./dL
pH, UA: 6 (ref 5.0–8.0)

## 2021-04-26 LAB — LIPID PANEL
Cholesterol: 239 mg/dL — ABNORMAL HIGH (ref 0–200)
HDL: 77 mg/dL (ref >39.00)
LDL Cholesterol: 148 mg/dL — ABNORMAL HIGH (ref 0–99)
NonHDL: 161.74
Total CHOL/HDL Ratio: 3
Triglycerides: 67 mg/dL (ref 0.0–149.0)
VLDL: 13.4 mg/dL (ref 0.0–40.0)

## 2021-04-26 LAB — VITAMIN D 25 HYDROXY (VIT D DEFICIENCY, FRACTURES): VITD: 40.93 ng/mL (ref 30.00–100.00)

## 2021-04-26 LAB — TSH: TSH: 3.55 u[IU]/mL (ref 0.35–5.50)

## 2021-04-26 NOTE — Assessment & Plan Note (Signed)
Encourage heart healthy diet such as MIND or DASH diet, increase exercise, avoid trans fats, simple carbohydrates and processed foods, consider a krill or fish or flaxseed oil cap daily.  °

## 2021-04-26 NOTE — Assessment & Plan Note (Signed)
Check labs 

## 2021-04-26 NOTE — Assessment & Plan Note (Signed)
Check labs and [place]

## 2021-04-26 NOTE — Assessment & Plan Note (Signed)
Check [place] Check labs May be from Aromasin

## 2021-04-26 NOTE — Patient Instructions (Signed)

## 2021-04-26 NOTE — Progress Notes (Addendum)
Subjective:   By signing my name below, I, Shehryar Baig, attest that this documentation has been prepared under the direction and in the presence of Dr. Roma Schanz, DO. 04/26/2021    Patient ID: Kimberly Miles, female    DOB: 1955-07-30, 65 y.o.   MRN: [id number]  Chief Complaint  Patient presents with   Abdominal Pain    X2 months, pt states she was having stomach pain and one episode of vomiting and diarrhea. Pt states pain is on and off. Pt states having upper abdominal cramping.     Abdominal Pain Pertinent negatives include no dysuria, fever, frequency, headaches or nausea.  Patient is in today for a office visit.   Abdominal pain- She complains of sharp abdominal pain for the past couple of months. She reports having sharp pain in her upper abdomen also with urgency to use the bathroom. She reports developing diarrhea for one day shortly after her symptoms started. She continues having feeling of fullness and constant dull abdominal pain after her diarrhea resolved. She reports a couple of weeks ago she had an episode of sharp abdominal pain that did not develop into diarrhea. She has not taken any medication to manage her pain. She denies pain while urinating.  Bloating- She also complains of bloating in the morning for the past couple of months. She continues taking 12.5 mg Aromasin daily PO after breakfast and reports having occasional nausea and cramps after eating. She reports starting Aromasin in August 2022 when her abdominal pain and bloating symptoms started. Phlegm in throat- She complains of phlegm building up in her throat. She also recently developed a cough as well. The phlegm in her throat is causing her throat to feel scratchy. She is not taking any medication nto manage her symptoms.  Osteopetrosis- She participate in exercise by following a regiment called osteo strong to help manage her osteoporosis and reports no new issues while doing it.    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   IBS (irritable bowel syndrome)    Migraine    MTHFR gene mutation    Personal history of radiation therapy    PMB (postmenopausal bleeding)    PONV (postoperative nausea and vomiting)    Post-surgical hypothyroidism     Past Surgical History:  Procedure Laterality Date   APPENDECTOMY  as child   BREAST BIOPSY Left 1979   benign   BREAST EXCISIONAL BIOPSY Left    BREAST LUMPECTOMY Right 01/2019   BREAST LUMPECTOMY WITH RADIOACTIVE SEED AND SENTINEL LYMPH NODE BIOPSY Right 02/04/2019   Procedure: RIGHT BREAST LUMPECTOMY WITH RADIOACTIVE SEED AND RIGHT AXILLARY DEEP SENTINEL LYMPH NODE BIOPSY WITH BLUE DYE INJECTION;  Surgeon: Fanny Skates, MD;  Location: [place];  Service: General;  Laterality: Right;   BREAST REDUCTION SURGERY Bilateral 02/11/2019   Procedure: RIGHT BREAST ONCOPLASTIC RECONSTRUCTION, LEFT BREAST REDUCTION;  Surgeon: Irene Limbo, MD;  Location: [place];  Service: Plastics;  Laterality: Bilateral;   CARDIOVASCULAR STRESS TEST  09-16-2013   normal nuclear study w/ no ischemia/  normal LV function and wall motion , ef 84%   CESAREAN SECTION  1977 and 1978   Bilateral Tubal Ligation w/ last c/s   COLPOSCOPY  05/2009   CIN 1   HYSTEROSCOPY WITH D & C N/A 12/23/2015   Procedure: DILATATION AND CURETTAGE /HYSTEROSCOPY  ;  Surgeon: Megan Salon, MD;  Location: [facility];  Service: Gynecology;  Laterality: N/A;   NEGATIVE SLEEP STUDY  2014  per pt   OOPHORECTOMY Right 1985   ruptured cyst    REDUCTION MAMMAPLASTY Bilateral 01/2019   THYROID LOBECTOMY Right 03-08-2011   TRANSTHORACIC ECHOCARDIOGRAM  05-09-2011   ef 64%/  mild MR and TR   TUBAL LIGATION      Family History  Problem Relation Age of Onset   Hypertension Maternal Grandmother    Uterine cancer Maternal Grandmother 27   Vascular Disease Maternal Grandmother    Lung cancer Maternal Grandmother 30   Stroke Maternal Grandfather    Prostate cancer Paternal Grandfather 17   Migraines Paternal Grandmother    Lung cancer Mother 62   Hypertension Mother 48   Hyperlipidemia Mother 6   Ehlers-Danlos syndrome Mother    Kidney disease Mother    Fibromyalgia Mother    Other Father        bypass surgery times 2   Migraines Father    Osteoporosis Father    Prostate cancer Father    Kidney disease Maternal Aunt     Social History   Socioeconomic History   Marital status: Married    Spouse name: Not on file   Number of children: 2   Years of education: 12   Highest education level: Not on file  Occupational History   Occupation: Retired  Tobacco Use   Smoking status: Never   Smokeless tobacco: Never  Scientific laboratory technician Use: Never used  Substance and Sexual Activity   Alcohol use: Not Currently    Alcohol/week: 0.0 standard drinks   Drug use: No   Sexual activity: Yes    Partners: Male    Birth control/protection: Post-menopausal    Comment: BTL done w/ last C/S  Other Topics Concern   Not on file  Social History Narrative   Lives at home w/ her husband   Right-handed   Caffeine: 1/4 cup per day   Social Determinants of Health   Financial Resource Strain: Not on file  Food Insecurity: Not on file  Transportation Needs: Not on file  Physical Activity: Not on file  Stress: Not on file  Social  Connections: Not on file  Intimate Partner Violence: Not on file    Outpatient Medications Prior to Visit  Medication Sig Dispense Refill   Ascorbic Acid (VITAMIN C) 1000 MG tablet Take 1,000 mg by mouth daily. 2 tablet daily     ASTAXANTHIN PO Take by mouth.     Cholecalciferol (VITAMIN D3) 5000 UNITS CAPS Take 1 capsule by mouth daily.     exemestane (AROMASIN) 25 MG tablet Take 0.5 tablets (12.5 mg total) by mouth daily after breakfast. 90 tablet 1   Melatonin 3 MG TABS Take 10 mg by mouth at bedtime.  Multiple Vitamins-Minerals (MULTIVITAMIN WITH MINERALS) tablet Take 1 tablet by mouth daily.     rizatriptan (MAXALT) 10 MG tablet TAKE 1 TABLET (10 MG TOTAL) BY MOUTH AS NEEDED FOR MIGRAINE. MAY REPEAT IN 2 HOURS IF NEEDED 10 tablet 0   zinc gluconate 50 MG tablet Take 15 mg by mouth daily.     zolpidem (AMBIEN) 5 MG tablet Take 1 tablet (5 mg total) by mouth at bedtime as needed for sleep. 30 tablet 0   cyclobenzaprine (FLEXERIL) 5 MG tablet Take 1 tablet by mouth daily as needed.  (Patient not taking: Reported on 11/08/2020)     iodine-sodium iodide 2-2.4 % solution Apply topically once.  (Patient not taking: Reported on 11/08/2020)     levothyroxine (EUTHYROX) 75 MCG tablet Take 1 tablet (75 mcg total) by mouth daily before breakfast. (Patient not taking: Reported on 11/08/2020) 30 tablet 0   No facility-administered medications prior to visit.    Allergies  Allergen Reactions   Codeine Nausea Only   Demerol [Meperidine] Nausea And Vomiting   Erythromycin Other (See Comments)    Stomach upset   Sulfa Antibiotics Other (See Comments)    unknown reaction   Ampicillin Rash    She reports she also "saw stars"   Chlorhexidine Rash    Review of Systems  Constitutional:  Negative for fever and malaise/fatigue.  HENT:  Negative for congestion.        (+)scratchy throat  Eyes:  Negative for blurred vision.  Respiratory:  Positive for cough and sputum production. Negative for  shortness of breath.   Cardiovascular:  Negative for chest pain, palpitations and leg swelling.  Gastrointestinal:  Positive for abdominal pain. Negative for blood in stool and nausea.       (+)bloating in he morning.  Genitourinary:  Negative for dysuria and frequency.  Musculoskeletal:  Negative for falls.  Skin:  Negative for rash.  Neurological:  Negative for dizziness, loss of consciousness and headaches.  Endo/Heme/Allergies:  Negative for environmental allergies.  Psychiatric/Behavioral:  Negative for depression. The patient is not nervous/anxious.       Objective:    Physical Exam Vitals and nursing note reviewed.  Constitutional:      General: She is not in acute distress.    Appearance: Normal appearance. She is well-developed. She is not ill-appearing.  HENT:     Head: Normocephalic and atraumatic.     Right Ear: External ear normal.     Left Ear: External ear normal.  Eyes:     Extraocular Movements: Extraocular movements intact.     Conjunctiva/sclera: Conjunctivae normal.     Pupils: Pupils are equal, round, and reactive to light.  Neck:     Thyroid: No thyromegaly.     Vascular: No carotid bruit or JVD.  Cardiovascular:     Rate and Rhythm: Normal rate and regular rhythm.     Heart sounds: Normal heart sounds. No murmur heard.   No gallop.  Pulmonary:     Effort: Pulmonary effort is normal. No respiratory distress.     Breath sounds: Normal breath sounds. No wheezing or rales.  Chest:     Chest wall: No tenderness.  Abdominal:     Palpations: Abdomen is soft.     Tenderness: There is no abdominal tenderness.  Musculoskeletal:     Cervical back: Normal range of motion and neck supple.  Skin:    General: Skin is warm and dry.  Neurological:     Mental Status: She is  alert and oriented to person, place, and time.  Psychiatric:        Mood and Affect: Mood normal.        Behavior: Behavior normal.        Judgment: Judgment normal.    BP 110/60 (BP  Location: Left Arm, Patient Position: Sitting, Cuff Size: Normal)   Pulse 70   Temp 98.1 F (36.7 C) (Oral)   Resp 18   Ht 4\' 10"  (1.473 m)   Wt 112 lb 12.8 oz (51.2 kg)   LMP 08/25/2010   SpO2 99%   BMI 23.58 kg/m  Wt Readings from Last 3 Encounters:  04/26/21 112 lb 12.8 oz (51.2 kg)  12/21/20 111 lb 1.6 oz (50.4 kg)  11/08/20 110 lb (49.9 kg)    Diabetic Foot Exam - Simple   No data filed    Lab Results  Component Value Date   WBC 5.9 03/22/2017   HGB 14.4 03/22/2017   HCT 43.3 03/22/2017   PLT 210.0 03/22/2017   GLUCOSE 84 10/03/2019   CHOL 244 (H) 10/03/2019   TRIG 94 10/03/2019   HDL 91 10/03/2019   LDLCALC 137 (H) 10/03/2019   ALT 28 04/09/2020   AST 26 04/09/2020   NA 142 10/03/2019   K 4.0 10/03/2019   CL 103 10/03/2019   CREATININE 0.58 10/03/2019   BUN 7 (L) 10/03/2019   CO2 25 10/03/2019   TSH 3.325 11/08/2020   INR 1.0 07/03/2014    Lab Results  Component Value Date   TSH 3.325 11/08/2020   Lab Results  Component Value Date   WBC 5.9 03/22/2017   HGB 14.4 03/22/2017   HCT 43.3 03/22/2017   MCV 97.8 03/22/2017   PLT 210.0 03/22/2017   Lab Results  Component Value Date   NA 142 10/03/2019   K 4.0 10/03/2019   CO2 25 10/03/2019   GLUCOSE 84 10/03/2019   BUN 7 (L) 10/03/2019   CREATININE 0.58 10/03/2019   BILITOT 0.4 04/09/2020   ALKPHOS 89 10/03/2019   AST 26 04/09/2020   ALT 28 04/09/2020   PROT 6.5 04/09/2020   ALBUMIN 4.7 10/03/2019   CALCIUM 9.1 10/03/2019   GFR 118.24 09/26/2017   Lab Results  Component Value Date   CHOL 244 (H) 10/03/2019   Lab Results  Component Value Date   HDL 91 10/03/2019   Lab Results  Component Value Date   LDLCALC 137 (H) 10/03/2019   Lab Results  Component Value Date   TRIG 94 10/03/2019   Lab Results  Component Value Date   CHOLHDL 2.7 10/03/2019   No results found for: HGBA1C     Assessment & Plan:   Problem List Items Addressed This Visit       Unprioritized   Abdominal  bloating    Check labs and [place]       Relevant Orders   US Abdomen Complete   US Pelvic Complete With Transvaginal   Acute cough    mucinex prm  Antihistamine rto prn      Relevant Orders   DG Chest 2 View   Hyperlipidemia    .Encourage heart healthy diet such as MIND or DASH diet, increase exercise, avoid trans fats, simple carbohydrates and processed foods, consider a krill or fish or flaxseed oil cap daily.       Relevant Orders   Lipid panel   Hypothyroid    Check labs       Relevant Orders   TSH  Lower abdominal pain - Primary    Check [place] Check labs May be from Aromasin       Relevant Orders   POCT Urinalysis Dipstick (Automated)   CBC with Differential/Platelet   Comprehensive metabolic panel   US Abdomen Complete   US Pelvic Complete With Transvaginal   Vitamin D deficiency    Check labs       Relevant Orders   VITAMIN D 25 Hydroxy (Vit-D Deficiency, Fractures)     No orders of the defined types were placed in this encounter.   I, Dr. Roma Schanz, DO, personally preformed the services described in this documentation.  All medical record entries made by the scribe were at my direction and in my presence.  I have reviewed the chart and discharge instructions (if applicable) and agree that the record reflects my personal performance and is accurate and complete. 04/26/2021   I,Shehryar Baig,acting as a scribe for Ann Held, DO.,have documented all relevant documentation on the behalf of Ann Held, DO,as directed by  Ann Held, DO while in the presence of Ann Held, DO.   Ann Held, DO

## 2021-04-26 NOTE — Assessment & Plan Note (Signed)
mucinex prm  Antihistamine rto prn

## 2021-04-27 ENCOUNTER — Ambulatory Visit (HOSPITAL_BASED_OUTPATIENT_CLINIC_OR_DEPARTMENT_OTHER)
Admission: RE | Admit: 2021-04-27 | Discharge: 2021-04-27 | Disposition: A | Discharge status: Home / Self Care | Payer: Medicare Other | Source: Ambulatory Visit | Attending: Family Medicine | Admitting: Family Medicine

## 2021-04-27 DIAGNOSIS — R1030 Lower abdominal pain, unspecified: Secondary | ICD-10-CM | POA: Diagnosis not present

## 2021-04-27 DIAGNOSIS — N888 Other specified noninflammatory disorders of cervix uteri: Secondary | ICD-10-CM | POA: Diagnosis not present

## 2021-04-27 DIAGNOSIS — R102 Pelvic and perineal pain: Secondary | ICD-10-CM | POA: Diagnosis not present

## 2021-04-27 DIAGNOSIS — R051 Acute cough: Secondary | ICD-10-CM | POA: Diagnosis not present

## 2021-04-27 DIAGNOSIS — R140 Abdominal distension (gaseous): Secondary | ICD-10-CM | POA: Diagnosis not present

## 2021-04-27 DIAGNOSIS — R059 Cough, unspecified: Secondary | ICD-10-CM | POA: Diagnosis not present

## 2021-05-23 DIAGNOSIS — M222X1 Patellofemoral disorders, right knee: Secondary | ICD-10-CM | POA: Diagnosis not present

## 2021-06-09 ENCOUNTER — Other Ambulatory Visit: Payer: Self-pay

## 2021-06-09 ENCOUNTER — Ambulatory Visit
Admission: RE | Admit: 2021-06-09 | Discharge: 2021-06-09 | Disposition: A | Discharge status: Home / Self Care | Payer: Medicare Other | Source: Ambulatory Visit | Attending: Hematology and Oncology | Admitting: Hematology and Oncology

## 2021-06-09 DIAGNOSIS — Z853 Personal history of malignant neoplasm of breast: Secondary | ICD-10-CM | POA: Diagnosis not present

## 2021-06-09 DIAGNOSIS — C50311 Malignant neoplasm of lower-inner quadrant of right female breast: Secondary | ICD-10-CM

## 2021-06-09 DIAGNOSIS — M222X1 Patellofemoral disorders, right knee: Secondary | ICD-10-CM | POA: Diagnosis not present

## 2021-06-09 MED ORDER — GADOBUTROL 1 MMOL/ML IV SOLN
5.0000 mL | Freq: Once | INTRAVENOUS | Status: AC | PRN
  Administered 2021-06-09: 5 mL via INTRAVENOUS

## 2021-06-16 DIAGNOSIS — M222X1 Patellofemoral disorders, right knee: Secondary | ICD-10-CM | POA: Diagnosis not present

## 2021-06-23 DIAGNOSIS — M222X1 Patellofemoral disorders, right knee: Secondary | ICD-10-CM | POA: Diagnosis not present

## 2021-08-24 ENCOUNTER — Other Ambulatory Visit: Payer: Self-pay | Admitting: *Deleted

## 2021-08-24 DIAGNOSIS — M818 Other osteoporosis without current pathological fracture: Secondary | ICD-10-CM

## 2021-08-24 MED ORDER — EXEMESTANE 25 MG PO TABS: 25.0000 mg | ORAL_TABLET | Freq: Every day | ORAL | 1 refills | Status: DC

## 2021-08-24 NOTE — Telephone Encounter (Signed)
Received call from pt requesting change in Exemestane dose.  Pt states she is currently taking half a tablet (12.5 mg) p.o daily.  Pt states the tablets are hard to cut and ends up having to waste the other half of the tablet.  Pt requesting to increase exemestane to 1 tablet (25 mg total) daily.  Reviewed with MD and verbalized okay to proceed.  MD states if pt is unable to tolerate one tablet daily, pt may switch to 1 tablet every other day.  ? ?Pt also requesting bone density report to be done within the next few weeks.  Pt states she has started an osteo strong program and currently has osteoporosis and would like to evaluate if osteo strong is helping to strengthen her bones. RN reviewed with MD and verbal orders received for pt to obtain bone density scan.  Orders faxed to [facility], appt scheduled and pt verbalized understanding of appt date and time.  ?

## 2021-08-29 ENCOUNTER — Encounter: Payer: Self-pay | Admitting: Hematology and Oncology

## 2021-08-29 DIAGNOSIS — M810 Age-related osteoporosis without current pathological fracture: Secondary | ICD-10-CM | POA: Diagnosis not present

## 2021-08-29 DIAGNOSIS — M85852 Other specified disorders of bone density and structure, left thigh: Secondary | ICD-10-CM | POA: Diagnosis not present

## 2021-08-29 DIAGNOSIS — Z780 Asymptomatic menopausal state: Secondary | ICD-10-CM | POA: Diagnosis not present

## 2021-08-29 LAB — HM DEXA SCAN

## 2021-09-02 ENCOUNTER — Telehealth: Payer: Self-pay

## 2021-09-02 NOTE — Telephone Encounter (Signed)
Pt called and requested DEXA results to be faxed to her email. Advised pt we cam not fax to an email and I would be happy to mail results to pt. Pt verbalized thanks and results obtained from [facility] and mailed to pt's home. ?

## 2021-09-16 DIAGNOSIS — D3132 Benign neoplasm of left choroid: Secondary | ICD-10-CM | POA: Diagnosis not present

## 2021-09-16 DIAGNOSIS — H11823 Conjunctivochalasis, bilateral: Secondary | ICD-10-CM | POA: Diagnosis not present

## 2021-09-16 DIAGNOSIS — H04123 Dry eye syndrome of bilateral lacrimal glands: Secondary | ICD-10-CM | POA: Diagnosis not present

## 2021-09-16 DIAGNOSIS — H25813 Combined forms of age-related cataract, bilateral: Secondary | ICD-10-CM | POA: Diagnosis not present

## 2021-09-27 ENCOUNTER — Telehealth: Payer: Self-pay | Admitting: Family Medicine

## 2021-09-27 NOTE — Telephone Encounter (Signed)
Noted  

## 2021-09-27 NOTE — Telephone Encounter (Signed)
Pt stated she has been having tingling and numbness in her shoulder and neck spreading up to her chin and the top of her head for a couple months. She did not want to be seen until Thursday. Transferred to triage to speak with a nurse.  ?

## 2021-09-27 NOTE — Telephone Encounter (Signed)
Appt scheduled 09/29/21.  ?

## 2021-09-27 NOTE — Telephone Encounter (Signed)
Nurse Assessment ?Nurse: Thad Ranger RN, Denise Date/Time (Eastern Time): 09/27/2021 10:35:31 AM ?Confirm and document reason for call. If ?symptomatic, describe symptoms. ?---Caller states she is having tingling and numbness ?between her shoulders and up her neck. Has been ?going on the last couple months. Denies pain and ?radiation of numbness in UE's. No rash but scalp is ?tingling. Sensation is positional. ?Does the patient have any new or worsening ?symptoms? ---Yes ?Will a triage be completed? ---Yes ?Related visit to physician within the last 2 weeks? ---No ?Does the PT have any chronic conditions? (i.e. ?diabetes, asthma, this includes High risk factors for ?pregnancy, etc.) ?---No ?Is this a behavioral health or substance abuse call? ---No ?Guidelines ?Guideline Title Affirmed Question Affirmed Notes Nurse Date/Time (Eastern ?Time) ?Neck Pain or ?Stiffness ?High-risk adult (e.g., ?history of cancer, ?HIV, or IV drug use) ?Thad Ranger, RN, Langley Gauss 09/27/2021 10:37:50 ?AM ?Disp. Time (Eastern ?Time) Disposition Final User ?09/27/2021 10:40:55 AM See PCP within 24 Hours Yes Carmon, RN, Langley Gauss ?PLEASE NOTE: All timestamps contained within this report are represented as [place] Standard Time. ?CONFIDENTIALTY NOTICE: This fax transmission is intended only for the addressee. It contains information that is legally privileged, confidential or ?otherwise protected from use or disclosure. If you are not the intended recipient, you are strictly prohibited from reviewing, disclosing, copying using ?or disseminating any of this information or taking any action in reliance on or regarding this information. If you have received this fax in error, please ?notify us immediately by telephone so that we can arrange for its return to [place]. Phone: [phone], Toll-Free: [phone], Fax: [phone] ?Page: 2 of 2 ?Call Id: [id number] ?Caller Disagree/Comply Comply ?Caller Understands Yes ?PreDisposition Call Doctor ?Care Advice Given Per  Guideline ?SEE PCP WITHIN 24 HOURS: PAIN MEDICINES: * ACETAMINOPHEN - EXTRA STRENGTH TYLENOL: Take 1,000 ?mg (two 500 mg pills) every 6 to 8 hours as needed. Each Extra Strength Tylenol pill has 500 mg of acetaminophen. The most you ?should take is 6 pills a day (3,000 mg total). Note: In [place], the maximum is 8 pills a day (4,000 mg total). * IBUPROFEN (E.G., ?MOTRIN, ADVIL): Take 400 mg (two 200 mg pills) by mouth every 6 hours. The most you should take is 6 pills a day (1,200 mg ?total). CALL BACK IF: * Fever occurs * You become worse CARE ADVICE given per Neck Pain (Adult) guideline. ?Comments ?User: Romeo Apple, RN Date/Time Eilene Ghazi Time): 09/27/2021 10:37:39 AM ?Pt reports neck pain during triage. ?User: Romeo Apple, RN Date/Time Eilene Ghazi Time): 09/27/2021 10:39:37 AM ?Hx of breast CA ?User: Romeo Apple, RN Date/Time Eilene Ghazi Time): 09/27/2021 10:40:44 AM ?Pt states she is unable to come in today or tomorrow due to other appts and has already made appt for this Thurs ?w/PCP ?Referrals ?REFERRED TO PCP OFFICE ?Warm transfer to backline ?

## 2021-09-29 ENCOUNTER — Ambulatory Visit (HOSPITAL_BASED_OUTPATIENT_CLINIC_OR_DEPARTMENT_OTHER)
Admission: RE | Admit: 2021-09-29 | Discharge: 2021-09-29 | Disposition: A | Discharge status: Home / Self Care | Payer: Medicare Other | Source: Ambulatory Visit | Attending: Family Medicine | Admitting: Family Medicine

## 2021-09-29 ENCOUNTER — Ambulatory Visit (INDEPENDENT_AMBULATORY_CARE_PROVIDER_SITE_OTHER): Payer: Medicare Other | Admitting: Family Medicine

## 2021-09-29 ENCOUNTER — Encounter: Payer: Self-pay | Admitting: Family Medicine

## 2021-09-29 VITALS — BP 106/76 | HR 77 | Temp 98.3°F | Resp 16 | Ht <= 58 in | Wt 114.8 lb

## 2021-09-29 DIAGNOSIS — M542 Cervicalgia: Secondary | ICD-10-CM | POA: Insufficient documentation

## 2021-09-29 DIAGNOSIS — M40204 Unspecified kyphosis, thoracic region: Secondary | ICD-10-CM | POA: Diagnosis not present

## 2021-09-29 DIAGNOSIS — R202 Paresthesia of skin: Secondary | ICD-10-CM

## 2021-09-29 DIAGNOSIS — M546 Pain in thoracic spine: Secondary | ICD-10-CM

## 2021-09-29 DIAGNOSIS — R200 Anesthesia of skin: Secondary | ICD-10-CM

## 2021-09-29 NOTE — Progress Notes (Signed)
? ?Subjective:  ? ?By signing my name below, I, Shehryar Baig, attest that this documentation has been prepared under the direction and in the presence of Ann Held, DO. 09/29/2021 ? ? ? Patient ID: Kimberly Miles, female    DOB: 09-16-1955, 66 y.o.   MRN: [id number] ? ?Chief Complaint  ?Patient presents with  ? Numbness  ?  Pt states having pin and needles between her shoulders and the sensation will go up her neck and her face. Pt states not nausea or dizziness when sxs occur.   ? ? ?HPI ?Patient is in today for a office visit.  ? ?She complains of tightness and numbness sensation on her clap. She notices her symptoms started when she started completing yard work. Otherwise the only other strenuous activity she completes is attending an [employer]  ?She also complains of tingling around her shoulder blades. She notices her tingling is going up to her face. She notes the tingling goes back and forth between her shoulders. She has chronic neck pain. Her symptoms worsen when she tilts her head down or when she is laying on her recliner. She denies having any spasms. She has tried OTC tylenol but found no relief. She had a thyroid procedure in 2012 and found she has slight numbness across her neck since. She also has mild numbness where her lymph nodes were removed by her breasts. She regularly takes vitamin B12 supplements but has not checked it since 2012. ?She has numbness in her arms and hands when laying on them at night for long periods or when her carpal tunnel flares up.  ? ? ?Past Medical History:  ?Diagnosis Date  ? Anxiety   ? Arthritis   ? Cancer ([place]) 11/2018  ? right breast cancer  ? Common migraine with intractable migraine 04/06/2016  ? Complication of anesthesia   ? Diverticulosis   ? Elevated LFTs   ? monitored by pcp per pt --  unknown etiology  ? Family history of premature CAD 09/01/2013  ? Family history of prostate cancer   ? Family history of uterine cancer   ? History of  basal cell carcinoma excision   ? 2004   &  2014  ? History of colitis   ? History of palpitations   ? History of thyroid nodule   ? 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 IBS (irritable bowel syndrome)   ? Migraine   ? MTHFR gene mutation   ? Personal history of radiation therapy   ? PMB (postmenopausal bleeding)   ? PONV (postoperative nausea and vomiting)   ? Post-surgical hypothyroidism   ? ? ?Past Surgical History:  ?Procedure Laterality Date  ? APPENDECTOMY  as child  ? BREAST BIOPSY Left 1979  ? benign  ? BREAST EXCISIONAL BIOPSY Left   ? BREAST LUMPECTOMY Right 01/2019  ? BREAST LUMPECTOMY WITH RADIOACTIVE SEED AND SENTINEL LYMPH NODE BIOPSY Right 02/04/2019  ? Procedure: RIGHT BREAST LUMPECTOMY WITH RADIOACTIVE SEED AND RIGHT AXILLARY DEEP SENTINEL LYMPH NODE BIOPSY WITH BLUE DYE INJECTION;  Surgeon: Fanny Skates, MD;  Location: [facility];  Service: General;  Laterality: Right;  ? BREAST REDUCTION SURGERY Bilateral 02/11/2019  ? Procedure: RIGHT BREAST ONCOPLASTIC RECONSTRUCTION, LEFT BREAST REDUCTION;  Surgeon: Irene Limbo, MD;  Location: [facility];  Service: Plastics;  Laterality: Bilateral;  ? CARDIOVASCULAR STRESS TEST  09-16-2013  ? normal nuclear study w/ no ischemia/  normal LV function and wall motion , ef 84%  ? [place]  ? Bilateral Tubal Ligation w/ last c/s  ? COLPOSCOPY  05/2009  ? CIN 1  ? HYSTEROSCOPY WITH D & C N/A 12/23/2015  ? Procedure: DILATATION AND CURETTAGE /HYSTEROSCOPY ;  Surgeon: Megan Salon, MD;  Location: [facility];  Service: Gynecology;  Laterality: N/A;  ? NEGATIVE SLEEP STUDY  2014  per pt  ? OOPHORECTOMY Right 1985  ? ruptured cyst   ? REDUCTION MAMMAPLASTY Bilateral 01/2019  ? THYROID LOBECTOMY Right 03-08-2011  ? TRANSTHORACIC ECHOCARDIOGRAM  05-09-2011  ? ef 64%/  mild MR and TR  ? TUBAL LIGATION    ? ? ?Family History  ?Problem Relation Age of  Onset  ? Hypertension Maternal Grandmother   ? Uterine cancer Maternal Grandmother 29  ? Vascular Disease Maternal Grandmother   ? Lung cancer Maternal Grandmother 91  ? Stroke Maternal Grandfather   ? Prostate cancer Paternal Grandfather 49  ? Migraines Paternal Grandmother   ? Lung cancer Mother 23  ? Hypertension Mother 23  ? Hyperlipidemia Mother 48  ? Ehlers-Danlos syndrome Mother   ? Kidney disease Mother   ? Fibromyalgia Mother   ? Other Father   ?     bypass surgery times 2  ? Migraines Father   ? Osteoporosis Father   ? Prostate cancer Father   ? Kidney disease Maternal Aunt   ? ? ?Social History  ? ?Socioeconomic History  ? Marital status: Married  ?  Spouse name: Not on file  ? Number of children: 2  ? Years of education: 93  ? Highest education level: Not on file  ?Occupational History  ? Occupation: Retired  ?Tobacco Use  ? Smoking status: Never  ? Smokeless tobacco: Never  ?Vaping Use  ? Vaping Use: Never used  ?Substance and Sexual Activity  ? Alcohol use: Not Currently  ?  Alcohol/week: 0.0 standard drinks  ? Drug use: No  ? Sexual activity: Yes  ?  Partners: Male  ?  Birth control/protection: Post-menopausal  ?  Comment: BTL done w/ last C/S  ?Other Topics Concern  ? Not on file  ?Social History Narrative  ? Lives at home w/ her husband  ? Right-handed  ? Caffeine: 1/4 cup per day  ? ?Social Determinants of Health  ? ?Financial Resource Strain: Not on file  ?Food Insecurity: Not on file  ?Transportation Needs: Not on file  ?Physical Activity: Not on file  ?Stress: Not on file  ?Social Connections: Not on file  ?Intimate Partner Violence: Not on file  ? ? ?Outpatient Medications Prior to Visit  ?Medication Sig Dispense Refill  ? Ascorbic Acid (VITAMIN C) 1000 MG tablet Take 1,000 mg by mouth daily. 2 tablet daily    ? ASTAXANTHIN PO Take by mouth.    ? Cholecalciferol (VITAMIN D3) 5000 UNITS CAPS Take 1 capsule by mouth daily.    ? exemestane (AROMASIN) 25 MG tablet Take 1 tablet (25 mg total) by  mouth daily after breakfast. 90 tablet 1  ? Melatonin 3 MG TABS Take 10 mg by mouth at bedtime.     ? Multiple Vitamins-Minerals (MULTIVITAMIN WITH MINERALS) tablet Take 1 tablet by mouth daily.    ? zinc gluconate 50 MG tablet Take 15 mg by mouth daily.    ? zolpidem (AMBIEN) 5 MG tablet Take 1 tablet (5 mg total) by mouth at bedtime as needed for sleep. [place]  tablet 0  ? rizatriptan (MAXALT) 10 MG tablet TAKE 1 TABLET (10 MG TOTAL) BY MOUTH AS NEEDED FOR MIGRAINE. MAY REPEAT IN 2 HOURS IF NEEDED 10 tablet 0  ? ?No facility-administered medications prior to visit.  ? ? ?Allergies  ?Allergen Reactions  ? Codeine Nausea Only  ? Demerol [Meperidine] Nausea And Vomiting  ? Erythromycin Other (See Comments)  ?  Stomach upset  ? Sulfa Antibiotics Other (See Comments)  ?  unknown reaction  ? Ampicillin Rash  ?  She reports she also "saw stars"  ? Chlorhexidine Rash  ? ? ?Review of Systems  ?Neurological:   ?     (+)tingling on scalp ?(+)tingling on shoulder blades ?(+)tingling on arms and hand  ? ?   ?Objective:  ?  ?Physical Exam ?Constitutional:   ?   General: She is not in acute distress. ?   Appearance: Normal appearance. She is not ill-appearing.  ?HENT:  ?   Head: Normocephalic and atraumatic.  ?   Right Ear: External ear normal.  ?   Left Ear: External ear normal.  ?Eyes:  ?   Extraocular Movements: Extraocular movements intact.  ?   Pupils: Pupils are equal, round, and reactive to light.  ?Cardiovascular:  ?   Rate and Rhythm: Normal rate and regular rhythm.  ?   Heart sounds: Normal heart sounds. No murmur heard. ?  No gallop.  ?Pulmonary:  ?   Effort: Pulmonary effort is normal. No respiratory distress.  ?   Breath sounds: Normal breath sounds. No wheezing or rales.  ?Musculoskeletal:  ?   Comments: 5/5 strength in upper extremities  ?Skin: ?   General: Skin is warm and dry.  ?Neurological:  ?   Mental Status: She is alert and oriented to person, place, and time.  ?Psychiatric:     ?   Judgment: Judgment normal.   ? ? ?BP 106/76 (BP Location: Left Arm, Patient Position: Sitting, Cuff Size: Normal)   Pulse 77   Temp 98.3 ?F (36.8 ?C) (Oral)   Resp 16   Ht '4\' 10"'$  (1.473 m)   Wt 114 lb 12.8 oz (52.1 kg)   LMP 08/25/18

## 2021-09-29 NOTE — Patient Instructions (Signed)
Pinched Nerve ?A pinched nerve is an injury that occurs when too much pressure is placed on a nerve. This pressure can cause pain, burning, and muscle weakness in places that the nerve supplies feeling to, such as an arm, hand, or leg, or the back or neck. If a nerve is severely pinched or has been pinched for a long time, permanent nerve damage can occur. ?What are the causes? ?This condition may be caused by: ?A nerve passing through a narrow area between bones or other body structures. ?Arthritis that causes bones to press on a nerve. ?Loss of blood supply to a nerve. ?A nerve being stretched from an injury. ?A sudden injury with swelling. ?Long-term wear on the nerve. ?Age-related changes in the spine. ?What are the signs or symptoms? ?The most common symptoms of a pinched nerve are feeling a tingling sensation and numbness. Other symptoms include: ?Pain that spreads from one area of the body part to another. ?A burning feeling. ?Muscle weakness. ?How is this diagnosed? ?This condition may be diagnosed based on: ?A physical exam. During the exam, your health care provider will: ?Check for numbness and muscle weakness. ?Move affected body parts to test for pain. ?X-rays to check for bone damage. ?An MRI or CT scan to check for conditions that may be causing nerve damage. ?An electromyogram and nerve conduction study to evaluate how your muscles and nerves communicate. ?How is this treated? ?A pinched nerve is usually treated first by: ?Resting the affected body area. ?Using devices to help you move without pain (supportive or protective devices), such as a splint, brace, or neck collar. ?Other treatments depend on your symptoms and the amount of nerve damage you have. Other treatments may include: ?Medicines, such as: ?Injections of numbing medicine. ?NSAIDs. ?Pain medicines. ?Steroid medicines. These may be given as a pill or as an injection. ?Physical therapy to relieve pain, maintain movement, and improve  muscle strength. ?Surgery. This may be done if other treatments do not work. ?Follow these instructions at home: ?If you have a removable collar, splint, or brace: ?Wear the collar, splint, or brace as told by your health care provider. Remove it only as told by your health care provider. ?Check the skin around the collar, splint, or brace every day. Tell your health care provider about any concerns. ?Loosen the collar, splint, or brace if any part of your body tingles, becomes numb, or turns cold and blue. ?Keep the collar, splint or brace clean. ?If the collar, splint, or brace is not waterproof: ?Do not let it get wet. ?Cover it with a watertight covering when you take a bath or shower. ?Ask your health care provider when it is safe to drive if you have a collar, splint, or brace. ?Managing pain and stiffness ?  ?If directed, put ice on the affected area. To do this: ?If you have a removable collar, splint, or brace, remove it as told by your health care provider. ?Put ice in a plastic bag. ?Place a towel between your skin and the bag. ?Leave the ice on for 20 minutes, 2-3 times a day. ?Remove the ice if your skin turns bright red. This is very important. If you cannot feel pain, heat, or cold, you have a greater risk of damage to the area. ?If directed, apply heat to the affected area before you exercise. Use the heat source that your health care provider recommends, such as a moist heat pack or a heating pad. ?Place a towel between your skin  and the heat source. ?Leave the heat on for 20-30 minutes. ?Remove the heat if your skin turns bright red. This is especially important if you are unable to feel pain, heat, or cold. You may have a greater risk of getting burned. ?General instructions ?Take over-the-counter and prescription medicines only as told by your health care provider. ?Wear supportive or protective devices as told by your health care provider. ?Do physical therapy exercises as directed by your  health care provider or physical therapist. ?Return to your normal activities as told by your health care provider. Ask your health care provider what activities are safe for you. ?Rest as needed. ?Keep all follow-up visits. This is important. ?Where to find more information ?[employer] of Neurological Disorders and Stroke: MasterBoxes.it ?Contact a health care provider if: ?Your condition does not improve with treatment. ?Your pain, numbness, or weakness suddenly gets worse. ?You have new weakness in your arms or legs. ?Get help right away if you: ?Have loss of bladder control (urinary incontinence) or you cannot urinate. ?Cannot control bowel movements (fecal incontinence). ?These symptoms may be an emergency. Get help right away. Call 911. ?Do not wait to see if the symptoms will go away. ?Do not drive yourself to the hospital. ?Summary ?A pinched nerve is an injury that occurs when too much pressure is placed on a nerve. ?This pressure can cause pain, burning, and muscle weakness in places that the nerve supplies feeling to, such as an arm, hand, or leg, or the back or neck. ?Take over-the-counter and prescription medicines only as told by your health care provider. ?Ask your health care provider what activities are safe for you. ?This information is not intended to replace advice given to you by your health care provider. Make sure you discuss any questions you have with your health care provider. ?Document Revised: 02/21/2021 Document Reviewed: 02/21/2021 ?Elsevier Patient Education ? [place]. ? ?

## 2021-09-30 ENCOUNTER — Other Ambulatory Visit: Payer: Self-pay | Admitting: Family Medicine

## 2021-09-30 DIAGNOSIS — M5030 Other cervical disc degeneration, unspecified cervical region: Secondary | ICD-10-CM

## 2021-09-30 LAB — CBC WITH DIFFERENTIAL/PLATELET
Absolute Monocytes: 451 cells/uL (ref 200–950)
Basophils Absolute: 30 cells/uL (ref 0–200)
Basophils Relative: 0.4 %
Eosinophils Absolute: 89 cells/uL (ref 15–500)
Eosinophils Relative: 1.2 %
HCT: 41.4 % (ref 35.0–45.0)
Hemoglobin: 14.1 g/dL (ref 11.7–15.5)
Lymphs Abs: 2161 cells/uL (ref 850–3900)
MCH: 32.3 pg (ref 27.0–33.0)
MCHC: 34.1 g/dL (ref 32.0–36.0)
MCV: 95 fL (ref 80.0–100.0)
MPV: 12.5 fL (ref 7.5–12.5)
Monocytes Relative: 6.1 %
Neutro Abs: 4669 cells/uL (ref 1500–7800)
Neutrophils Relative %: 63.1 %
Platelets: 239 10*3/uL (ref 140–400)
RBC: 4.36 10*6/uL (ref 3.80–5.10)
RDW: 11.7 % (ref 11.0–15.0)
Total Lymphocyte: 29.2 %
WBC: 7.4 10*3/uL (ref 3.8–10.8)

## 2021-09-30 LAB — COMPREHENSIVE METABOLIC PANEL
AG Ratio: 2.1 (calc) (ref 1.0–2.5)
ALT: 53 U/L — ABNORMAL HIGH (ref 6–29)
AST: 29 U/L (ref 10–35)
Albumin: 4.4 g/dL (ref 3.6–5.1)
Alkaline phosphatase (APISO): 78 U/L (ref 37–153)
BUN: 17 mg/dL (ref 7–25)
CO2: 26 mmol/L (ref 20–32)
Calcium: 9.9 mg/dL (ref 8.6–10.4)
Chloride: 107 mmol/L (ref 98–110)
Creat: 0.75 mg/dL (ref 0.50–1.05)
Globulin: 2.1 g/dL (calc) (ref 1.9–3.7)
Glucose, Bld: 94 mg/dL (ref 65–99)
Potassium: 5.3 mmol/L (ref 3.5–5.3)
Sodium: 142 mmol/L (ref 135–146)
Total Bilirubin: 0.4 mg/dL (ref 0.2–1.2)
Total Protein: 6.5 g/dL (ref 6.1–8.1)

## 2021-09-30 LAB — VITAMIN B12: Vitamin B-12: 342 pg/mL (ref 200–1100)

## 2021-09-30 LAB — TSH: TSH: 4.36 mIU/L (ref 0.40–4.50)

## 2021-10-04 ENCOUNTER — Encounter: Payer: Self-pay | Admitting: Family Medicine

## 2021-10-06 ENCOUNTER — Telehealth: Payer: Self-pay | Admitting: *Deleted

## 2021-10-06 DIAGNOSIS — R748 Abnormal levels of other serum enzymes: Secondary | ICD-10-CM

## 2021-10-06 NOTE — Telephone Encounter (Signed)
-----   Message from Ann Held, DO sent at 10/06/2021 12:32 PM EDT ----- ?Liver enzyme elevated ----  any increase in tylenol or etoh? Or other otc meds ?  ?

## 2021-10-06 NOTE — Telephone Encounter (Signed)
Patient notified of results.  She did have a migraine on 09/27/21 and did take some Tylenol and aspirin.  She also has had a hx of elevated liver enzymes.  She also wanted to know if her medication Aromasin will cause it to go up.  She would like to restart her thyroid medication.  She has been gaining weight.  Can you send in? ?

## 2021-10-07 ENCOUNTER — Encounter: Payer: Self-pay | Admitting: *Deleted

## 2021-10-07 NOTE — Telephone Encounter (Signed)
How soon can she get the lfts checked again? ?

## 2021-10-07 NOTE — Telephone Encounter (Signed)
Patient notified via mychart

## 2021-10-15 ENCOUNTER — Ambulatory Visit
Admission: RE | Admit: 2021-10-15 | Discharge: 2021-10-15 | Disposition: A | Discharge status: Home / Self Care | Payer: Medicare Other | Source: Ambulatory Visit | Attending: Family Medicine | Admitting: Family Medicine

## 2021-10-15 DIAGNOSIS — M5030 Other cervical disc degeneration, unspecified cervical region: Secondary | ICD-10-CM

## 2021-10-15 DIAGNOSIS — M4802 Spinal stenosis, cervical region: Secondary | ICD-10-CM | POA: Diagnosis not present

## 2021-10-15 DIAGNOSIS — R200 Anesthesia of skin: Secondary | ICD-10-CM | POA: Diagnosis not present

## 2021-10-15 DIAGNOSIS — M542 Cervicalgia: Secondary | ICD-10-CM | POA: Diagnosis not present

## 2021-10-15 DIAGNOSIS — R202 Paresthesia of skin: Secondary | ICD-10-CM | POA: Diagnosis not present

## 2021-10-18 ENCOUNTER — Other Ambulatory Visit: Payer: Self-pay

## 2021-10-18 DIAGNOSIS — M5030 Other cervical disc degeneration, unspecified cervical region: Secondary | ICD-10-CM

## 2021-11-03 ENCOUNTER — Ambulatory Visit (INDEPENDENT_AMBULATORY_CARE_PROVIDER_SITE_OTHER): Payer: Medicare Other

## 2021-11-03 ENCOUNTER — Encounter: Payer: Self-pay | Admitting: Specialist

## 2021-11-03 ENCOUNTER — Ambulatory Visit (INDEPENDENT_AMBULATORY_CARE_PROVIDER_SITE_OTHER): Payer: Medicare Other | Admitting: Specialist

## 2021-11-03 VITALS — BP 118/77 | HR 68 | Ht <= 58 in | Wt 115.0 lb

## 2021-11-03 DIAGNOSIS — M542 Cervicalgia: Secondary | ICD-10-CM | POA: Diagnosis not present

## 2021-11-03 DIAGNOSIS — M4722 Other spondylosis with radiculopathy, cervical region: Secondary | ICD-10-CM

## 2021-11-03 MED ORDER — METAXALONE 800 MG PO TABS
400.0000 mg | ORAL_TABLET | Freq: Three times a day (TID) | ORAL | 0 refills | Status: DC
Start: 2021-11-03 — End: 2023-03-06

## 2021-11-03 MED ORDER — MELOXICAM 15 MG PO TABS: 15.0000 mg | ORAL_TABLET | Freq: Every day | ORAL | 1 refills | Status: DC

## 2021-11-03 NOTE — Patient Instructions (Addendum)
Avoid overhead lifting and overhead use of the arms. ?Do not lift greater than 5-10 lbs. ?Adjust head rest in vehicle to prevent hyperextension if rear ended. ?Take extra precautions to avoid falling. ?Physical therapy for spondylosis cervical spine. ?EMG/NCV left arm with neurology evaluation for assessment of left UE numbness. ? ?

## 2021-11-03 NOTE — Progress Notes (Signed)
? ?Office Visit Note ?  ?Patient: Kimberly Miles           ?Date of Birth: [birth year]           ?MRN: [id number] ?Visit Date: 11/03/2021 ?             ?Requested by: Carollee Herter, Kendrick Fries R, DO ?[address] ?[address] ?[place],  [place] [id number] ?PCP: Ann Held, DO ? ? ?Assessment & Plan: ?Visit Diagnoses:  ?1. Cervicalgia   ?2. Other spondylosis with radiculopathy, cervical region   ? ? ?Plan: Avoid overhead lifting and overhead use of the arms. ?Do not lift greater than 5 lbs. ?Adjust head rest in vehicle to prevent hyperextension if rear ended. ?Take extra precautions to avoid falling.  ?Neurology evaluation with EMG/NCV. ?Meloxicam 15 mg daily with meal or snack. ?Skelaxin for muscle spasm. ? ?Follow-Up Instructions: Return in about 4 weeks (around 12/01/2021).  ? ?Orders:  ?Orders Placed This Encounter  ?Procedures  ? XR Cervical Spine 2 or 3 views  ? ?No orders of the defined types were placed in this encounter. ? ? ? ? Procedures: ?No procedures performed ? ? ?Clinical Data: ?No additional findings. ? ? ?Subjective: ?Chief Complaint  ?Patient presents with  ? Neck - Pain  ? ? ?HPI ? ?Review of Systems ? ? ?Objective: ?Vital Signs: BP 118/77 (BP Location: Left Arm, Patient Position: Sitting)   Pulse 68   Ht '4\' 10"'$  (1.473 m)   Wt 115 lb (52.2 kg)   LMP 08/25/2010   BMI 24.04 kg/m?  ? ?Physical Exam ? ?Ortho Exam ? ?Specialty Comments:  ?No specialty comments available. ? ?Imaging: ?XR Cervical Spine 2 or 3 views ? ?Result Date: 11/03/2021 ?Ap and lateral flexion and extension radiographs show mild degenerative disc disease C5-6 and C6-7 There is uncovertebral hypertrophy at the C5-6 level and mild C6-7. Some reversal of the lover one half of the Cervical spine.  ? ? ?PMFS History: ?Patient Active Problem List  ? Diagnosis Date Noted  ? Lower abdominal pain 04/26/2021  ? Acute cough 04/26/2021  ? Vitamin D deficiency 04/26/2021  ? Abdominal bloating 04/26/2021  ? Age-related osteoporosis  without current pathological fracture 04/09/2020  ? Genetic testing 08/07/2019  ? Family history of prostate cancer   ? Family history of uterine cancer   ? MTHFR gene mutation 01/16/2019  ? Epigastric pain 01/15/2019  ? 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 Hypothyroid 11/11/2018  ? Memory change 11/13/2016  ? Common migraine with intractable migraine 04/06/2016  ? H/O partial thyroidectomy 01/14/2014  ? Family history of premature CAD 09/01/2013  ? History of migraine headaches 02/21/2013  ? Hyperlipidemia 02/21/2013  ? Thyroid function study abnormality 02/21/2013  ? ?Past Medical History:  ?Diagnosis Date  ? Anxiety   ? Arthritis   ? Cancer ([place]) 11/2018  ? right breast cancer  ? Common migraine with intractable migraine 04/06/2016  ? Complication of anesthesia   ? Diverticulosis   ? Elevated LFTs   ? monitored by pcp per pt --  unknown etiology  ? Family history of premature CAD 09/01/2013  ? Family history of prostate cancer   ? Family history of uterine cancer   ? History of basal cell carcinoma excision   ? 2004   &  2014  ? History of colitis   ? History of palpitations   ? History of thyroid nodule   ?  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 IBS (irritable bowel syndrome)   ? Migraine   ? MTHFR gene mutation   ? Personal history of radiation therapy   ? PMB (postmenopausal bleeding)   ? PONV (postoperative nausea and vomiting)   ? Post-surgical hypothyroidism   ?  ?Family History  ?Problem Relation Age of Onset  ? Hypertension Maternal Grandmother   ? Uterine cancer Maternal Grandmother 16  ? Vascular Disease Maternal Grandmother   ? Lung cancer Maternal Grandmother 4  ? Stroke Maternal Grandfather   ? Prostate cancer Paternal Grandfather 62  ? Migraines Paternal Grandmother   ? Lung cancer Mother 44  ? Hypertension Mother 59  ? Hyperlipidemia Mother 27  ? Ehlers-Danlos syndrome Mother   ?  Kidney disease Mother   ? Fibromyalgia Mother   ? Other Father   ?     bypass surgery times 2  ? Migraines Father   ? Osteoporosis Father   ? Prostate cancer Father   ? Kidney disease Maternal Aunt   ?  ?Past Surgical History:  ?Procedure Laterality Date  ? APPENDECTOMY  as child  ? BREAST BIOPSY Left 1979  ? benign  ? BREAST EXCISIONAL BIOPSY Left   ? BREAST LUMPECTOMY Right 01/2019  ? BREAST LUMPECTOMY WITH RADIOACTIVE SEED AND SENTINEL LYMPH NODE BIOPSY Right 02/04/2019  ? Procedure: RIGHT BREAST LUMPECTOMY WITH RADIOACTIVE SEED AND RIGHT AXILLARY DEEP SENTINEL LYMPH NODE BIOPSY WITH BLUE DYE INJECTION;  Surgeon: Fanny Skates, MD;  Location: [facility];  Service: General;  Laterality: Right;  ? BREAST REDUCTION SURGERY Bilateral 02/11/2019  ? Procedure: RIGHT BREAST ONCOPLASTIC RECONSTRUCTION, LEFT BREAST REDUCTION;  Surgeon: Irene Limbo, MD;  Location: [facility];  Service: Plastics;  Laterality: Bilateral;  ? CARDIOVASCULAR STRESS TEST  09-16-2013  ? normal nuclear study w/ no ischemia/  normal LV function and wall motion , ef 84%  ? [place]  ? Bilateral Tubal Ligation w/ last c/s  ? COLPOSCOPY  05/2009  ? CIN 1  ? HYSTEROSCOPY WITH D & C N/A 12/23/2015  ? Procedure: DILATATION AND CURETTAGE /HYSTEROSCOPY ;  Surgeon: Megan Salon, MD;  Location: [facility];  Service: Gynecology;  Laterality: N/A;  ? NEGATIVE SLEEP STUDY  2014  per pt  ? OOPHORECTOMY Right 1985  ? ruptured cyst   ? REDUCTION MAMMAPLASTY Bilateral 01/2019  ? THYROID LOBECTOMY Right 03-08-2011  ? TRANSTHORACIC ECHOCARDIOGRAM  05-09-2011  ? ef 64%/  mild MR and TR  ? TUBAL LIGATION    ? ?Social History  ? ?Occupational History  ? Occupation: Retired  ?Tobacco Use  ? Smoking status: Never  ? Smokeless tobacco: Never  ?Vaping Use  ? Vaping Use: Never used  ?Substance and Sexual Activity  ? Alcohol use: Not Currently  ?  Alcohol/week: 0.0 standard drinks  ? Drug use: No  ?  Sexual activity: Yes  ?  Partners: Male  ?  Birth control/protection: Post-menopausal  ?  Comment: BTL done w/ last C/S  ? ? ? ? ? ? ?

## 2021-11-09 ENCOUNTER — Other Ambulatory Visit: Payer: Self-pay

## 2021-11-09 ENCOUNTER — Encounter: Payer: Self-pay | Admitting: Physical Therapy

## 2021-11-09 ENCOUNTER — Ambulatory Visit: Payer: Medicare Other | Attending: Specialist | Admitting: Physical Therapy

## 2021-11-09 DIAGNOSIS — M6281 Muscle weakness (generalized): Secondary | ICD-10-CM | POA: Insufficient documentation

## 2021-11-09 DIAGNOSIS — M4722 Other spondylosis with radiculopathy, cervical region: Secondary | ICD-10-CM | POA: Diagnosis not present

## 2021-11-09 DIAGNOSIS — M542 Cervicalgia: Secondary | ICD-10-CM | POA: Insufficient documentation

## 2021-11-09 DIAGNOSIS — R252 Cramp and spasm: Secondary | ICD-10-CM | POA: Diagnosis not present

## 2021-11-09 NOTE — Therapy (Addendum)
?OUTPATIENT PHYSICAL THERAPY CERVICAL EVALUATION ? ? ?Patient Name: Kimberly Miles ?MRN: [id number] ?DOB:01/28/1956, 66 y.o., female ?Today's Date: 11/09/2021 ? ? PT End of Session - 11/09/21 1349   ? ? Visit Number 1   ? Date for PT Re-Evaluation 12/21/21   ? Authorization Type MCR   ? Progress Note Due on Visit 10   ? PT Start Time 9326   ? PT Stop Time 7124   ? PT Time Calculation (min) 48 min   ? Activity Tolerance Patient tolerated treatment well   ? Behavior During Therapy [facility] for tasks assessed/performed   ? ?  ?  ? ?  ? ? ?Past Medical History:  ?Diagnosis Date  ? Anxiety   ? Arthritis   ? Cancer ([place]) 11/2018  ? right breast cancer  ? Common migraine with intractable migraine 04/06/2016  ? Complication of anesthesia   ? Diverticulosis   ? Elevated LFTs   ? monitored by pcp per pt --  unknown etiology  ? Family history of premature CAD 09/01/2013  ? Family history of prostate cancer   ? Family history of uterine cancer   ? History of basal cell carcinoma excision   ? 2004   &  2014  ? History of colitis   ? History of palpitations   ? History of thyroid nodule   ? 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 IBS (irritable bowel syndrome)   ? Migraine   ? MTHFR gene mutation   ? Personal history of radiation therapy   ? PMB (postmenopausal bleeding)   ? PONV (postoperative nausea and vomiting)   ? Post-surgical hypothyroidism   ? ?Past Surgical History:  ?Procedure Laterality Date  ? APPENDECTOMY  as child  ? BREAST BIOPSY Left 1979  ? benign  ? BREAST EXCISIONAL BIOPSY Left   ? BREAST LUMPECTOMY Right 01/2019  ? BREAST LUMPECTOMY WITH RADIOACTIVE SEED AND SENTINEL LYMPH NODE BIOPSY Right 02/04/2019  ? Procedure: RIGHT BREAST LUMPECTOMY WITH RADIOACTIVE SEED AND RIGHT AXILLARY DEEP SENTINEL LYMPH NODE BIOPSY WITH BLUE DYE INJECTION;  Surgeon: Fanny Skates, MD;  Location: [place];  Service: General;  Laterality: Right;  ? BREAST REDUCTION  SURGERY Bilateral 02/11/2019  ? Procedure: RIGHT BREAST ONCOPLASTIC RECONSTRUCTION, LEFT BREAST REDUCTION;  Surgeon: Irene Limbo, MD;  Location: [place];  Service: Plastics;  Laterality: Bilateral;  ? CARDIOVASCULAR STRESS TEST  09-16-2013  ? normal nuclear study w/ no ischemia/  normal LV function and wall motion , ef 84%  ? [place]  ? Bilateral Tubal Ligation w/ last c/s  ? COLPOSCOPY  05/2009  ? CIN 1  ? HYSTEROSCOPY WITH D & C N/A 12/23/2015  ? Procedure: DILATATION AND CURETTAGE /HYSTEROSCOPY ;  Surgeon: Megan Salon, MD;  Location: [facility];  Service: Gynecology;  Laterality: N/A;  ? NEGATIVE SLEEP STUDY  2014  per pt  ? OOPHORECTOMY Right 1985  ? ruptured cyst   ? REDUCTION MAMMAPLASTY Bilateral 01/2019  ? THYROID LOBECTOMY Right 03-08-2011  ? TRANSTHORACIC ECHOCARDIOGRAM  05-09-2011  ? ef 64%/  mild MR and TR  ? TUBAL LIGATION    ? ?Patient Active Problem List  ? Diagnosis Date Noted  ? Lower abdominal pain 04/26/2021  ? Acute cough 04/26/2021  ? Vitamin D deficiency 04/26/2021  ? Abdominal bloating 04/26/2021  ? Age-related osteoporosis without current pathological fracture 04/09/2020  ? Genetic testing  08/07/2019  ? Family history of prostate cancer   ? Family history of uterine cancer   ? MTHFR gene mutation 01/16/2019  ? Epigastric pain 01/15/2019  ? 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 Hypothyroid 11/11/2018  ? Memory change 11/13/2016  ? Common migraine with intractable migraine 04/06/2016  ? H/O partial thyroidectomy 01/14/2014  ? Family history of premature CAD 09/01/2013  ? History of migraine headaches 02/21/2013  ? Hyperlipidemia 02/21/2013  ? Thyroid function study abnormality 02/21/2013  ? ? ?PCP: Ann Held, DO ? ?REFERRING PROVIDER: Jessy Oto, MD ? ?REFERRING DIAG: M54.2 (ICD-10-CM) - Cervicalgia ?M47.22 (ICD-10-CM) - Other spondylosis with  radiculopathy, cervical region ? ?THERAPY DIAG:  ?Cervicalgia ? ?Cramp and spasm ? ?Muscle weakness (generalized) ? ?ONSET DATE: 06/26/21 ? ?SUBJECTIVE:                                                                                                                                                                                                        ? ?SUBJECTIVE STATEMENT: ?Patient reports she has chronic spinal issues (low and neck) for decades. Several years ago she fell backward and hit her head and she had a lot of pain on the right side and down between her spine and the shoulder blade. She stays very tense in the upper traps. She is presently remodeling a house and she is lifting a lot of things. She intermittently gets some pain down her arms R > L. She started feeling increased tingling in January and is now between her shoulder blades into her anterior neck and into face around chin to ears mainly when working overhead. She has had numbness since her thyroid surgery in 2012. The tingling is new. She denies pain. ? ?PERTINENT HISTORY:  ?Migraines, R lumpectomy and radiation 2020/2021,osteoporosis, thyroidectomy (one lobe precancerous) ? ?PAIN:  ?Are you having pain? No ? ?PRECAUTIONS: Other: h/o R lumpectomy and radiation 2020/2021,osteoporosis ? ?FALLS:  ?Has patient fallen in last 6 months? No ? ?LIVING ENVIRONMENT: ?Lives with: lives with their spouse ?Lives in: House/apartment ?Stairs: yes ?Has following equipment at home: None ? 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 ?PLOF: Independent ? ?PATIENT GOALS get rid of the numbness and tingling ? ?OBJECTIVE:  ? ?DIAGNOSTIC FINDINGS:  ?XR 11/03/21: mild degenerative disc disease C5-6 and C6-7. There is uncovertebral hypertrophy at the C5-6 level and mild C6-7. Some reversal of the lover one half of the cervical spine. ?MRI 10/15/21: 1. At C5-6 there is a tiny right paracentral disc  protrusion. ?Bilateral uncovertebral degenerative  changes. Moderate bilateral ?foraminal stenosis. ?2. At C6-7 there is a mild broad-based disc bulge. Bilateral ?uncovertebral degenerative changes. Mild bilateral foraminal ?stenosis. ? ?PATIENT SURVEYS:  ?FOTO FS = 70 (predicted 71) ? ? ?COGNITION: ?Overall cognitive status: Within functional limits for tasks assessed ? ? ?SENSATION: ?Decreased sensation in ant neck due to thryroid surgery ?Some decreased sensation in right scapula and b/w spine and scapula ? ?POSTURE:  ?Rounded shoulders, else WNL ? ?PALPATION: ?Increased muscle tension in B UT and right cervical paraspinals and subocciptals ?Decreased R sided lateral glides and R UPA mobility in C2/3, 3/4, C5/6 ? ?CERVICAL ROM:  ? ? ROM A/PROM (deg) ?11/09/2021  ?Flexion full  ?Extension full  ?Right lateral flexion 29 / full  ?Left lateral flexion 13 / full but tight  ?Right rotation 72 Pain on R / full  ?Left rotation 54 Pull on L down to scapula / full  ? (Blank rows = not tested) ? ?UE ROM: WNL  ? ? ? ?UE MMT: 5/5 UE and neck except deep neck flexors 5-/5 ?  Mid traps R 5/5 L 4+/5 ?  Low traps R 4+/5 L 4/5 ? ?CERVICAL SPECIAL TESTS:  ?Negative compression/distraction ? ? ?TODAY'S TREATMENT:  ?See HEP ? ? ?PATIENT EDUCATION:  ?Education details: HEP, POC discussed, DN introduced and h/o provided ?Person educated: Patient ?Education method: Explanation, Demonstration, and Handouts ?Education comprehension: verbalized understanding and returned demonstration ? ? ?HOME EXERCISE PROGRAM: ?Access Code: [email or web address] ?URL: [email or web address] ?Date: 11/09/2021 ?Prepared by: Almyra Free ? ?Exercises ?- Seated Levator Scapulae Stretch  - 2 x daily - 7 x weekly - 3 reps - 30 hold ?- Seated Upper Trapezius Stretch  - 2 x daily - 7 x weekly - 3 reps - 30 hold ?- Sternocleidomastoid Stretch  - 2 x daily - 7 x weekly - 3 reps - 30 hold ? ?ASSESSMENT: ? ?CLINICAL IMPRESSION: ?Patient is a 66 y.o. female who was seen today for physical therapy evaluation and treatment  for cervical radiculopathy. Patient reports long history of neck and back pain. She presently denies neck pain but is having intermittent tingling in the upper back, ant neck to jaw beginning in January. She also has

## 2021-11-16 ENCOUNTER — Ambulatory Visit: Payer: Medicare Other

## 2021-11-21 NOTE — Therapy (Signed)
OUTPATIENT PHYSICAL THERAPY TREATMENT NOTE   Patient Name: Kimberly Miles MRN: [id number] DOB:[birth year], 66 y.o., female Today's Date: 11/22/2021   PT End of Session - 11/22/21 0933     Visit Number 2    Date for PT Re-Evaluation 12/21/21    Authorization Type MCR    Progress Note Due on Visit 10    PT Start Time 0933    PT Stop Time 1016    PT Time Calculation (min) 43 min    Activity Tolerance Patient tolerated treatment well    Behavior During Therapy [facility] for tasks assessed/performed           Rationale for Evaluation and Treatment:  Rehabilitation    Past Medical History:  Diagnosis Date   Anxiety    Arthritis    Cancer ([place]) 11/2018   right breast cancer   Common migraine with intractable migraine 18/84/1660   Complication of anesthesia    Diverticulosis    Elevated LFTs    monitored by pcp per pt --  unknown etiology   Family history of premature CAD 09/01/2013   Family history of prostate cancer    Family history of uterine cancer    History of basal cell carcinoma excision    2004   &  2014   History of colitis    History of palpitations    History of thyroid nodule    s/p  right thyroid lobectomy 03-08-2011  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   IBS (irritable bowel syndrome)    Migraine    MTHFR gene mutation    Personal history of radiation therapy    PMB (postmenopausal bleeding)    PONV (postoperative nausea and vomiting)    Post-surgical hypothyroidism    Past Surgical History:  Procedure Laterality Date   APPENDECTOMY  as child   BREAST BIOPSY Left 1979   benign   BREAST EXCISIONAL BIOPSY Left    BREAST LUMPECTOMY Right 01/2019   BREAST LUMPECTOMY WITH RADIOACTIVE SEED AND SENTINEL LYMPH NODE BIOPSY Right 02/04/2019   Procedure: RIGHT BREAST LUMPECTOMY WITH RADIOACTIVE SEED AND RIGHT AXILLARY DEEP SENTINEL LYMPH NODE BIOPSY WITH BLUE DYE INJECTION;  Surgeon: Fanny Skates, MD;  Location: [place];  Service:  General;  Laterality: Right;   BREAST REDUCTION SURGERY Bilateral 02/11/2019   Procedure: RIGHT BREAST ONCOPLASTIC RECONSTRUCTION, LEFT BREAST REDUCTION;  Surgeon: Irene Limbo, MD;  Location: [place];  Service: Plastics;  Laterality: Bilateral;   CARDIOVASCULAR STRESS TEST  09-16-2013   normal nuclear study w/ no ischemia/  normal LV function and wall motion , ef 84%   CESAREAN SECTION  1977 and 1978   Bilateral Tubal Ligation w/ last c/s   COLPOSCOPY  05/2009   CIN 1   HYSTEROSCOPY WITH D & C N/A 12/23/2015   Procedure: DILATATION AND CURETTAGE /HYSTEROSCOPY ;  Surgeon: Megan Salon, MD;  Location: [facility];  Service: Gynecology;  Laterality: N/A;   NEGATIVE SLEEP STUDY  2014  per pt   OOPHORECTOMY Right 1985   ruptured cyst    REDUCTION MAMMAPLASTY Bilateral 01/2019   THYROID LOBECTOMY Right 03-08-2011   TRANSTHORACIC ECHOCARDIOGRAM  05-09-2011   ef 64%/  mild MR and TR   TUBAL LIGATION     Patient Active Problem List   Diagnosis Date Noted   Lower abdominal pain 04/26/2021   Acute cough 04/26/2021   Vitamin D deficiency 04/26/2021   Abdominal bloating 04/26/2021   Age-related osteoporosis without  current pathological fracture 04/09/2020   Genetic testing 08/07/2019   Family history of prostate cancer    Family history of uterine cancer    MTHFR gene mutation 01/16/2019   Epigastric pain 01/15/2019   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, estrogen receptor positive ([place]) 12/24/2018   Hypothyroid 11/11/2018   Memory change 11/13/2016   Common migraine with intractable migraine 04/06/2016   H/O partial thyroidectomy 01/14/2014   Family history of premature CAD 09/01/2013   History of migraine headaches 02/21/2013   Hyperlipidemia 02/21/2013   Thyroid function study abnormality 02/21/2013    PCP: Ann Held, DO  REFERRING PROVIDER: Jessy Oto, MD  REFERRING DIAG: M54.2 (ICD-10-CM) -  Cervicalgia M47.22 (ICD-10-CM) - Other spondylosis with radiculopathy, cervical region  THERAPY DIAG:  Cervicalgia  Cramp and spasm  Muscle weakness (generalized)  ONSET DATE: 06/26/21  SUBJECTIVE:                                                                                                                                                                                                         SUBJECTIVE STATEMENT: I think the stretches are helping. The anterior one sometimes causes some numbness.   PERTINENT HISTORY:  Migraines, R lumpectomy and radiation 2020/2021,osteoporosis, thyroidectomy (one lobe precancerous)  PAIN:  Are you having pain? No    PRECAUTIONS: Other: h/o R lumpectomy and radiation 2020/2021,osteoporosis  PATIENT GOALS get rid of the numbness and tingling  OBJECTIVE: (objective measurements taken at eval unless otherwise dated)   DIAGNOSTIC FINDINGS:  XR 11/03/21: mild degenerative disc disease C5-6 and C6-7. There is uncovertebral hypertrophy at the C5-6 level and mild C6-7. Some reversal of the lover one half of the cervical spine. MRI 10/15/21: 1. At C5-6 there is a tiny right paracentral disc protrusion. Bilateral uncovertebral degenerative changes. Moderate bilateral foraminal stenosis. 2. At C6-7 there is a mild broad-based disc bulge. Bilateral uncovertebral degenerative changes. Mild bilateral foraminal stenosis.  PATIENT SURVEYS:  FOTO FS = 70 (predicted 71)  SENSATION: Decreased sensation in ant neck due to thryroid surgery Some decreased sensation in right scapula and b/w spine and scapula  POSTURE:  Rounded shoulders, else WNL  PALPATION: Increased muscle tension in B UT and right cervical paraspinals and subocciptals Decreased R sided lateral glides and R UPA mobility in C2/3, 3/4, C5/6  CERVICAL ROM:    ROM A/PROM (deg) 11/09/2021  Flexion full  Extension full  Right lateral flexion 29 / full  Left lateral flexion 13 / full  but tight  Right rotation 72  Pain on R / full  Left rotation 54 Pull on L down to scapula / full   (Blank rows = not tested)  UE ROM: WNL     UE MMT: 5/5 UE and neck except deep neck flexors 5-/5   Mid traps R 5/5 L 4+/5   Low traps R 4+/5 L 4/5  CERVICAL SPECIAL TESTS:  Negative compression/distraction   TODAY'S TREATMENT:     11/22/21   Therapeutic Exercise:   Aerobic: UBE x 6 min (3 min fwd/3 min bwd) Prone: T's x 10 ea  Seated: UT, LS and ant neck stretches 2x30 sec ea bil with VCs required for correct form    Manual Therapy:  STM to bil UT, LS, cervical paraspinals and suboccipitals; skilled palpation and monitoring of soft tissues during DN    Trigger Point Dry-Needling  Treatment instructions: Expect mild to moderate muscle soreness. S/S of pneumothorax if dry needled over a lung field, and to seek immediate medical attention should they occur. Patient verbalized understanding of these instructions and education.  Patient Consent Given: Yes Education handout provided: Previously provided Muscles treated: left suboccipitals, bil cervical multifidi, bil UT, R LS Electrical stimulation performed: No Parameters: N/A Treatment response/outcome: Twitch Response Elicited and Palpable Increase in Muscle Length    PATIENT EDUCATION:  Education details: DN ed and aftercare reviewed Person educated: Patient Education method: Explanation, Demonstration, and Handouts Education comprehension: verbalized understanding and returned demonstration   HOME EXERCISE PROGRAM: Access Code: [email or web address]   ASSESSMENT:  CLINICAL IMPRESSION: Patty reports she is improving with stretches. She was excited to try DN today and had very good responses with decreased tension in posterior neck and with increased ease of cervical extension.     OBJECTIVE IMPAIRMENTS decreased ROM, decreased strength, hypomobility, increased muscle spasms, impaired flexibility, impaired UE functional use,  and postural dysfunction.   ACTIVITY LIMITATIONS  OH activity .   PERSONAL FACTORS Time since onset of injury/illness/exacerbation and 3+ comorbidities: OP, migraines, h/o thyroidectomy and radiation for breast CA  are also affecting patient's functional outcome.    GOALS: Goals reviewed with patient? Yes  SHORT TERM GOALS: Target date: 11/23/2021 (Remove Blue Hyperlink)  Patient will be independent with initial HEP.  Baseline:  Goal status: INITIAL   LONG TERM GOALS: Target date: 12/21/2021 (Remove Blue Hyperlink)  Patient will be independent with advanced/ongoing HEP to improve outcomes and carryover.  Baseline:  Goal status: INITIAL  2.  Patient will report 75% improvement in neck/back tingling to improve QOL.  Baseline:  Goal status: INITIAL  3.  Patient will demonstrate full cervical rotation for safety with driving and left lateral SB within 5 deg of R indicating improved flexibility.  Baseline:  Goal status: INITIAL  4.  Patient will demonstrate improved posture to decrease muscle imbalance. Baseline:  Goal status: INITIAL  5.  Patient will be able to work [facility] [address] with 75% less difficulty.    Baseline:  Goal status: INITIAL    PLAN: PT FREQUENCY: 2x/week  PT DURATION: 6 weeks  PLANNED INTERVENTIONS: Therapeutic exercises, Therapeutic activity, Neuromuscular re-education, Patient/Family education, Dry Needling, Electrical stimulation, Spinal mobilization, Cryotherapy, Moist heat, Ultrasound, and Manual therapy  PLAN FOR NEXT SESSION:  Progress HEP as needed, DN/MT, neck flexibility, upper back/postural strength, cervical stab   Elzy Tomasello, PT 11/22/2021, 3:12 PM

## 2021-11-22 ENCOUNTER — Encounter: Payer: Self-pay | Admitting: Physical Therapy

## 2021-11-22 ENCOUNTER — Other Ambulatory Visit: Payer: Self-pay

## 2021-11-22 ENCOUNTER — Ambulatory Visit: Payer: Medicare Other | Admitting: Physical Therapy

## 2021-11-22 DIAGNOSIS — R252 Cramp and spasm: Secondary | ICD-10-CM | POA: Diagnosis not present

## 2021-11-22 DIAGNOSIS — M6281 Muscle weakness (generalized): Secondary | ICD-10-CM

## 2021-11-22 DIAGNOSIS — M4722 Other spondylosis with radiculopathy, cervical region: Secondary | ICD-10-CM | POA: Diagnosis not present

## 2021-11-22 DIAGNOSIS — M542 Cervicalgia: Secondary | ICD-10-CM

## 2021-11-24 ENCOUNTER — Encounter: Payer: Self-pay | Admitting: Neurology

## 2021-11-24 ENCOUNTER — Ambulatory Visit (INDEPENDENT_AMBULATORY_CARE_PROVIDER_SITE_OTHER): Payer: Medicare Other | Admitting: Neurology

## 2021-11-24 VITALS — BP 120/74 | HR 62 | Ht 59.0 in | Wt 113.0 lb

## 2021-11-24 DIAGNOSIS — R292 Abnormal reflex: Secondary | ICD-10-CM

## 2021-11-24 DIAGNOSIS — R202 Paresthesia of skin: Secondary | ICD-10-CM | POA: Diagnosis not present

## 2021-11-24 NOTE — Progress Notes (Unsigned)
Chief Complaint  Patient presents with   New Patient (Initial Visit)    Rm 12. Alone. NP/Internal referral for cervical spondylosis, left arm numbness and paresthesias intermittently.      ASSESSMENT AND PLAN  Kimberly Miles is a 66 y.o. female   Paresthesia Hyperreflex of bilateral upper and lower extremity MRI of cervical spine showed evidence of mild degenerative changes, there was no significant canal stenosis or foraminal narrowing She desire further evaluations, will proceed with MRI of thoracic spine Laboratory evaluation to rule out nutritional deficiency, inflammatory process     DIAGNOSTIC DATA (LABS, IMAGING, TESTING) - I reviewed patient records, labs, notes, testing and imaging myself where available.   MEDICAL HISTORY:  [name], seen in request by   Jessy Oto, [place],  [place] [address], Carollee Herter, Alferd Apa, DO   I reviewed and summarized the referring note. PMHx. Right breast cancer, s/p lobectomy and radiation  Carry soid bag, garden, 50LB,   IN jan 2023, work in the yard, she noticed tinglings in her neck,   She had tyrhoild sugery, funny around scare  She put her Delma Post in certain position, jcome to her face, some shoulder blde, comes and goes, Jan 2023, more often,   No pain,  Sometimes finger feel weird,   [place], intermittetint right arm driving pain,  left arm numbness, toes feet   No gait no LEs, UEs, weakness, no cintonences,      Past Surgical History:  Procedure Laterality Date   APPENDECTOMY  as child   BREAST BIOPSY Left 1979   benign   BREAST EXCISIONAL BIOPSY Left    BREAST LUMPECTOMY Right 01/2019   BREAST LUMPECTOMY WITH RADIOACTIVE SEED AND SENTINEL LYMPH NODE BIOPSY Right 02/04/2019   Procedure: RIGHT BREAST LUMPECTOMY WITH RADIOACTIVE SEED AND RIGHT AXILLARY DEEP SENTINEL LYMPH NODE BIOPSY WITH BLUE DYE INJECTION;  Surgeon: Fanny Skates, MD;  Location: [facility];  Service: General;  Laterality: Right;   BREAST REDUCTION SURGERY Bilateral 02/11/2019   Procedure: RIGHT BREAST ONCOPLASTIC RECONSTRUCTION, LEFT BREAST REDUCTION;  Surgeon: Irene Limbo, MD;  Location: [facility];  Service: Plastics;  Laterality: Bilateral;   CARDIOVASCULAR STRESS TEST  09-16-2013   normal nuclear study w/ no ischemia/  normal LV function and wall motion , ef 84%   CESAREAN SECTION  1977 and 1978   Bilateral Tubal Ligation w/ last c/s   COLPOSCOPY  05/2009   CIN 1   HYSTEROSCOPY WITH D & C N/A 12/23/2015   Procedure: DILATATION AND CURETTAGE /HYSTEROSCOPY ;  Surgeon: Megan Salon, MD;  Location[facility];  Service: Gynecology;  Laterality: N/A;   NEGATIVE SLEEP STUDY  2014  per pt   OOPHORECTOMY Right 1985   ruptured cyst    REDUCTION MAMMAPLASTY Bilateral 01/2019   THYROID LOBECTOMY Right 03-08-2011   TRANSTHORACIC ECHOCARDIOGRAM  05-09-2011   ef 64%/  mild MR and TR   TUBAL LIGATION       PHYSICAL EXAM:   Vitals:   11/24/21 1427  BP: 120/74  Pulse: 62  Weight: 113 lb (51.3 kg)  Height: '4\' 11"'$  (1.499 m)   Not recorded     Body mass index is 22.82 kg/m.  PHYSICAL EXAMNIATION:  Gen: NAD, conversant, well nourised, well groomed                     Cardiovascular: Regular rate rhythm, no peripheral edema, warm,  nontender. Eyes: Conjunctivae clear without exudates or hemorrhage Neck: Supple, no carotid bruits. Pulmonary: Clear to auscultation bilaterally   NEUROLOGICAL EXAM:  MENTAL STATUS: Speech/cognition: Awake, alert, oriented to history taking and casual conversation CRANIAL NERVES: CN II: Visual fields are full to confrontation. Pupils are round equal and briskly reactive to light. CN III, IV, VI: extraocular movement are normal. No ptosis. CN V: Facial sensation is intact to light touch CN VII: Face is symmetric with normal eye closure  CN VIII: Hearing is normal to causal conversation. CN IX,  X: Phonation is normal. CN XI: Head turning and shoulder shrug are intact  MOTOR: There is no pronator drift of out-stretched arms. Muscle bulk and tone are normal. Muscle strength is normal.  REFLEXES: Reflexes are 2+ and symmetric at the biceps, triceps, 3/3knees, and ankles. Plantar responses are flexor.  SENSORY: Intact to light touch, pinprick and vibratory sensation are intact in fingers and toes.  COORDINATION: There is no trunk or limb dysmetria noted.  GAIT/STANCE: She can get arm crossed. Gait is steady with normal steps, base, arm swing, and turning. Heel and toe walking are normal. Tandem gait is normal.  Romberg is absent.  REVIEW OF SYSTEMS:  Full 14 system review of systems performed and notable only for as above All other review of systems were negative.   ALLERGIES: Allergies  Allergen Reactions   Codeine Nausea Only   Demerol [Meperidine] Nausea And Vomiting   Erythromycin Other (See Comments)    Stomach upset   Sulfa Antibiotics Other (See Comments)    unknown reaction   Ampicillin Rash    She reports she also "saw stars"   Chlorhexidine Rash    HOME MEDICATIONS: Current Outpatient Medications  Medication Sig Dispense Refill   Ascorbic Acid (VITAMIN C) 1000 MG tablet Take 1,000 mg by mouth daily. 2 tablet daily     ASTAXANTHIN PO Take by mouth.     Cholecalciferol (VITAMIN D3) 5000 UNITS CAPS Take 1 capsule by mouth daily.     exemestane (AROMASIN) 25 MG tablet Take 1 tablet (25 mg total) by mouth daily after breakfast. 90 tablet 1   Melatonin 3 MG TABS Take 10 mg by mouth at bedtime.      Multiple Vitamins-Minerals (MULTIVITAMIN WITH MINERALS) tablet Take 1 tablet by mouth daily.     zinc gluconate 50 MG tablet Take 15 mg by mouth daily.     zolpidem (AMBIEN) 5 MG tablet Take 1 tablet (5 mg total) by mouth at bedtime as needed for sleep. 30 tablet 0   meloxicam (MOBIC) 15 MG tablet Take 1 tablet (15 mg total) by mouth daily. (Patient not taking:  Reported on 11/24/2021) 30 tablet 1   metaxalone (SKELAXIN) 800 MG tablet Take 0.5 tablets (400 mg total) by mouth 3 (three) times daily. (Patient not taking: Reported on 11/24/2021) 30 tablet 0   No current facility-administered medications for this visit.    PAST MEDICAL HISTORY: Past Medical History:  Diagnosis Date   Anxiety    Arthritis    Cancer ([place]) 11/2018   right breast cancer   Common migraine with intractable migraine 16/03/9603   Complication of anesthesia    Diverticulosis    Elevated LFTs    monitored by pcp per pt --  unknown etiology   Family history of premature CAD 09/01/2013   Family history of prostate cancer    Family history of uterine cancer    History of basal cell carcinoma excision    2004   &  2014   History of colitis    History of palpitations    History of thyroid nodule    s/p  right thyroid lobectomy 03-08-2011  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   IBS (irritable bowel syndrome)    Migraine    MTHFR gene mutation    Personal history of radiation therapy    PMB (postmenopausal bleeding)    PONV (postoperative nausea and vomiting)    Post-surgical hypothyroidism     PAST SURGICAL HISTORY: Past Surgical History:  Procedure Laterality Date   APPENDECTOMY  as child   BREAST BIOPSY Left 1979   benign   BREAST EXCISIONAL BIOPSY Left    BREAST LUMPECTOMY Right 01/2019   BREAST LUMPECTOMY WITH RADIOACTIVE SEED AND SENTINEL LYMPH NODE BIOPSY Right 02/04/2019   Procedure: RIGHT BREAST LUMPECTOMY WITH RADIOACTIVE SEED AND RIGHT AXILLARY DEEP SENTINEL LYMPH NODE BIOPSY WITH BLUE DYE INJECTION;  Surgeon: Fanny Skates, MD;  Location: [facility];  Service: General;  Laterality: Right;   BREAST REDUCTION SURGERY Bilateral 02/11/2019   Procedure: RIGHT BREAST ONCOPLASTIC RECONSTRUCTION, LEFT BREAST REDUCTION;  Surgeon: Irene Limbo, MD;  Location: [facility];  Service: Plastics;  Laterality: Bilateral;    CARDIOVASCULAR STRESS TEST  09-16-2013   normal nuclear study w/ no ischemia/  normal LV function and wall motion , ef 84%   CESAREAN SECTION  1977 and 1978   Bilateral Tubal Ligation w/ last c/s   COLPOSCOPY  05/2009   CIN 1   HYSTEROSCOPY WITH D & C N/A 12/23/2015   Procedure: DILATATION AND CURETTAGE /HYSTEROSCOPY ;  Surgeon: Megan Salon, MD;  Location: [facility];  Service: Gynecology;  Laterality: N/A;   NEGATIVE SLEEP STUDY  2014  per pt   OOPHORECTOMY Right 1985   ruptured cyst    REDUCTION MAMMAPLASTY Bilateral 01/2019   THYROID LOBECTOMY Right 03-08-2011   TRANSTHORACIC ECHOCARDIOGRAM  05-09-2011   ef 64%/  mild MR and TR   TUBAL LIGATION      FAMILY HISTORY: Family History  Problem Relation Age of Onset   Hypertension Maternal Grandmother    Uterine cancer Maternal Grandmother 33   Vascular Disease Maternal Grandmother    Lung cancer Maternal Grandmother 75   Stroke Maternal Grandfather    Prostate cancer Paternal Grandfather 20   Migraines Paternal Grandmother    Lung cancer Mother 54   Hypertension Mother 47   Hyperlipidemia Mother 56   Ehlers-Danlos syndrome Mother    Kidney disease Mother    Fibromyalgia Mother    Other Father        bypass surgery times 2   Migraines Father    Osteoporosis Father    Prostate cancer Father    Kidney disease Maternal Aunt     SOCIAL HISTORY: Social History   Socioeconomic History   Marital status: Married    Spouse name: Not on file   Number of children: 2   Years of education: 12   Highest education level: Not on file  Occupational History   Occupation: Retired  Tobacco Use   Smoking status: Never   Smokeless tobacco: Never  Scientific laboratory technician Use: Never used  Substance and Sexual Activity   Alcohol use: Not Currently    Alcohol/week: 0.0 standard drinks   Drug use: No   Sexual activity: Yes    Partners: Male    Birth control/protection: Post-menopausal    Comment: BTL done w/ last C/S   Other  Topics Concern   Not on file  Social History Narrative   Lives at home w/ her husband   Right-handed   Caffeine: 1/4 cup per day   Social Determinants of Health   Financial Resource Strain: Not on file  Food Insecurity: Not on file  Transportation Needs: Not on file  Physical Activity: Not on file  Stress: Not on file  Social Connections: Not on file  Intimate Partner Violence: Not on file      Marcial Pacas, M.D. Ph.D.  [facility] [address] [place], [place] [address] Ph: [phone] Fax: [phone]  CC:  Jessy Oto, [place],  [place] [address]  Ann Held, DO

## 2021-11-26 LAB — ANA W/REFLEX IF POSITIVE: Anti Nuclear Antibody (ANA): NEGATIVE

## 2021-11-26 LAB — RPR: RPR Ser Ql: NONREACTIVE

## 2021-11-26 LAB — C-REACTIVE PROTEIN: CRP: <1 mg/L (ref 0–10)

## 2021-11-26 LAB — COPPER, SERUM: Copper: 111 ug/dL (ref 80–158)

## 2021-11-26 LAB — FOLATE: Folate: >20 ng/mL (ref >3.0)

## 2021-11-26 LAB — SEDIMENTATION RATE: Sed Rate: 3 mm/hr (ref 0–40)

## 2021-11-28 ENCOUNTER — Telehealth: Payer: Self-pay | Admitting: Neurology

## 2021-11-28 NOTE — Telephone Encounter (Signed)
medicare/BCBS sup NPR sent to [facility] they will call the patient to schedule

## 2021-11-29 ENCOUNTER — Ambulatory Visit: Payer: Medicare Other | Attending: Specialist

## 2021-11-29 DIAGNOSIS — M6281 Muscle weakness (generalized): Secondary | ICD-10-CM | POA: Diagnosis not present

## 2021-11-29 DIAGNOSIS — M542 Cervicalgia: Secondary | ICD-10-CM | POA: Diagnosis not present

## 2021-11-29 DIAGNOSIS — R252 Cramp and spasm: Secondary | ICD-10-CM | POA: Diagnosis not present

## 2021-11-29 NOTE — Therapy (Signed)
OUTPATIENT PHYSICAL THERAPY TREATMENT NOTE   Patient Name: Kimberly Miles MRN: [id number] DOB:Oct 23, 1955, 66 y.o., female Today's Date: 11/29/2021   PT End of Session - 11/29/21 1755     Visit Number 3    Date for PT Re-Evaluation 12/21/21    Authorization Type MCR    Progress Note Due on Visit 10    PT Start Time 5093    PT Stop Time 2671    PT Time Calculation (min) 50 min    Activity Tolerance Patient tolerated treatment well    Behavior During Therapy [facility] for tasks assessed/performed            Rationale for Evaluation and Treatment:  Rehabilitation    Past Medical History:  Diagnosis Date   Anxiety    Arthritis    Cancer ([place]) 11/2018   right breast cancer   Common migraine with intractable migraine 24/58/0998   Complication of anesthesia    Diverticulosis    Elevated LFTs    monitored by pcp per pt --  unknown etiology   Family history of premature CAD 09/01/2013   Family history of prostate cancer    Family history of uterine cancer    History of basal cell carcinoma excision    2004   &  2014   History of colitis    History of palpitations    History of thyroid nodule    s/p  right thyroid lobectomy 03-08-2011  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   IBS (irritable bowel syndrome)    Migraine    MTHFR gene mutation    Personal history of radiation therapy    PMB (postmenopausal bleeding)    PONV (postoperative nausea and vomiting)    Post-surgical hypothyroidism    Past Surgical History:  Procedure Laterality Date   APPENDECTOMY  as child   BREAST BIOPSY Left 1979   benign   BREAST EXCISIONAL BIOPSY Left    BREAST LUMPECTOMY Right 01/2019   BREAST LUMPECTOMY WITH RADIOACTIVE SEED AND SENTINEL LYMPH NODE BIOPSY Right 02/04/2019   Procedure: RIGHT BREAST LUMPECTOMY WITH RADIOACTIVE SEED AND RIGHT AXILLARY DEEP SENTINEL LYMPH NODE BIOPSY WITH BLUE DYE INJECTION;  Surgeon: Fanny Skates, MD;  Location: [facility];   Service: General;  Laterality: Right;   BREAST REDUCTION SURGERY Bilateral 02/11/2019   Procedure: RIGHT BREAST ONCOPLASTIC RECONSTRUCTION, LEFT BREAST REDUCTION;  Surgeon: Irene Limbo, MD;  Location: [facility];  Service: Plastics;  Laterality: Bilateral;   CARDIOVASCULAR STRESS TEST  09-16-2013   normal nuclear study w/ no ischemia/  normal LV function and wall motion , ef 84%   CESAREAN SECTION  1977 and 1978   Bilateral Tubal Ligation w/ last c/s   COLPOSCOPY  05/2009   CIN 1   HYSTEROSCOPY WITH D & C N/A 12/23/2015   Procedure: DILATATION AND CURETTAGE /HYSTEROSCOPY ;  Surgeon: Megan Salon, MD;  Location: [facility];  Service: Gynecology;  Laterality: N/A;   NEGATIVE SLEEP STUDY  2014  per pt   OOPHORECTOMY Right 1985   ruptured cyst    REDUCTION MAMMAPLASTY Bilateral 01/2019   THYROID LOBECTOMY Right 03-08-2011   TRANSTHORACIC ECHOCARDIOGRAM  05-09-2011   ef 64%/  mild MR and TR   TUBAL LIGATION     Patient Active Problem List   Diagnosis Date Noted   Paresthesia 11/24/2021   Hyperreflexia 11/24/2021   Lower abdominal pain 04/26/2021   Acute cough 04/26/2021   Vitamin D deficiency 04/26/2021  Abdominal bloating 04/26/2021   Age-related osteoporosis without current pathological fracture 04/09/2020   Genetic testing 08/07/2019   Family history of prostate cancer    Family history of uterine cancer    MTHFR gene mutation 01/16/2019   Epigastric pain 01/15/2019   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, estrogen receptor positive ([place]) 12/24/2018   Hypothyroid 11/11/2018   Memory change 11/13/2016   Common migraine with intractable migraine 04/06/2016   H/O partial thyroidectomy 01/14/2014   Family history of premature CAD 09/01/2013   History of migraine headaches 02/21/2013   Hyperlipidemia 02/21/2013   Thyroid function study abnormality 02/21/2013    PCP: Ann Held, DO  REFERRING PROVIDER: Jessy Oto, MD  REFERRING DIAG: M54.2 (ICD-10-CM) - Cervicalgia M47.22 (ICD-10-CM) - Other spondylosis with radiculopathy, cervical region  THERAPY DIAG:  Cervicalgia  Cramp and spasm  Muscle weakness (generalized)  ONSET DATE: 06/26/21  SUBJECTIVE:                                                                                                                                                                                                         SUBJECTIVE STATEMENT: Pt reports not sure if DN helped yet or not.   PERTINENT HISTORY:  Migraines, R lumpectomy and radiation 2020/2021,osteoporosis, thyroidectomy (one lobe precancerous)  PAIN:  Are you having pain? No    PRECAUTIONS: Other: h/o R lumpectomy and radiation 2020/2021,osteoporosis  PATIENT GOALS get rid of the numbness and tingling  OBJECTIVE: (objective measurements taken at eval unless otherwise dated)   DIAGNOSTIC FINDINGS:  XR 11/03/21: mild degenerative disc disease C5-6 and C6-7. There is uncovertebral hypertrophy at the C5-6 level and mild C6-7. Some reversal of the lover one half of the cervical spine. MRI 10/15/21: 1. At C5-6 there is a tiny right paracentral disc protrusion. Bilateral uncovertebral degenerative changes. Moderate bilateral foraminal stenosis. 2. At C6-7 there is a mild broad-based disc bulge. Bilateral uncovertebral degenerative changes. Mild bilateral foraminal stenosis.  PATIENT SURVEYS:  FOTO FS = 70 (predicted 71)  SENSATION: Decreased sensation in ant neck due to thryroid surgery Some decreased sensation in right scapula and b/w spine and scapula  POSTURE:  Rounded shoulders, else WNL  PALPATION: Increased muscle tension in B UT and right cervical paraspinals and subocciptals Decreased R sided lateral glides and R UPA mobility in C2/3, 3/4, C5/6  CERVICAL ROM:    ROM A/PROM (deg) 11/09/2021  Flexion full  Extension full  Right lateral flexion 29 / full  Left lateral flexion  13 / full but  tight  Right rotation 72 Pain on R / full  Left rotation 54 Pull on L down to scapula / full   (Blank rows = not tested)  UE ROM: WNL     UE MMT: 5/5 UE and neck except deep neck flexors 5-/5   Mid traps R 5/5 L 4+/5   Low traps R 4+/5 L 4/5  CERVICAL SPECIAL TESTS:  Negative compression/distraction   TODAY'S TREATMENT:  11/29/21 Therapeutic exercise: Nustep L3x48mn LS stretch 2x30" bil Seated thoracic extension over chair 10x3" Seated thoracic rotation 10x2" Seated shoulder squeezes 10x3" Seated B shoulder ER no resistance 10x3"  Manual Therapy: STM to B UT, LS, rhomboids Modalities: 10 min moist heat to cervical region post session    11/22/21   Therapeutic Exercise:   Aerobic: UBE x 6 min (3 min fwd/3 min bwd) Prone: T's x 10 ea  Seated: UT, LS and ant neck stretches 2x30 sec ea bil with VCs required for correct form    Manual Therapy:  STM to bil UT, LS, cervical paraspinals and suboccipitals; skilled palpation and monitoring of soft tissues during DN    Trigger Point Dry-Needling  Treatment instructions: Expect mild to moderate muscle soreness. S/S of pneumothorax if dry needled over a lung field, and to seek immediate medical attention should they occur. Patient verbalized understanding of these instructions and education.  Patient Consent Given: Yes Education handout provided: Previously provided Muscles treated: left suboccipitals, bil cervical multifidi, bil UT, R LS Electrical stimulation performed: No Parameters: N/A Treatment response/outcome: Twitch Response Elicited and Palpable Increase in Muscle Length    PATIENT EDUCATION:  Education details: DN ed and aftercare reviewed Person educated: Patient Education method: Explanation, Demonstration, and Handouts Education comprehension: verbalized understanding and returned demonstration   HOME EXERCISE PROGRAM: Access Code: [email or web address]   ASSESSMENT:  CLINICAL IMPRESSION: Pt  continues to demonstrate tension in UT/LS area (R side greater than L). Incorporated postural exercises for facilitation of posterior shoulder girdle muscles to improve neck/shoulder alignment. Pt showed a good demonstration of interventions with cues provided. Ended session with moist heat for muscle relaxation.    OBJECTIVE IMPAIRMENTS decreased ROM, decreased strength, hypomobility, increased muscle spasms, impaired flexibility, impaired UE functional use, and postural dysfunction.   ACTIVITY LIMITATIONS  OH activity .   PERSONAL FACTORS Time since onset of injury/illness/exacerbation and 3+ comorbidities: OP, migraines, h/o thyroidectomy and radiation for breast CA  are also affecting patient's functional outcome.    GOALS: Goals reviewed with patient? Yes  SHORT TERM GOALS: Target date: 11/23/2021 (Remove Blue Hyperlink)  Patient will be independent with initial HEP.  Baseline:  Goal status: INITIAL   LONG TERM GOALS: Target date: 12/21/2021 (Remove Blue Hyperlink)  Patient will be independent with advanced/ongoing HEP to improve outcomes and carryover.  Baseline:  Goal status: INITIAL  2.  Patient will report 75% improvement in neck/back tingling to improve QOL.  Baseline:  Goal status: INITIAL  3.  Patient will demonstrate full cervical rotation for safety with driving and left lateral SB within 5 deg of R indicating improved flexibility.  Baseline:  Goal status: INITIAL  4.  Patient will demonstrate improved posture to decrease muscle imbalance. Baseline:  Goal status: INITIAL  5.  Patient will be able to [facility] less difficulty.    Baseline:  Goal status: INITIAL    PLAN: PT FREQUENCY: 2x/week  PT DURATION: 6 weeks  PLANNED INTERVENTIONS: Therapeutic exercises, Therapeutic activity, Neuromuscular re-education, Patient/Family education, Dry Needling, Electrical stimulation, Spinal  mobilization, Cryotherapy, Moist heat, Ultrasound, and Manual  therapy  PLAN FOR NEXT SESSION:  Progress HEP as needed, DN/MT, neck flexibility, upper back/postural strength, cervical stab   Artist Pais, PTA 11/29/2021, 5:55 PM

## 2021-12-01 ENCOUNTER — Encounter: Payer: Self-pay | Admitting: Physical Therapy

## 2021-12-01 ENCOUNTER — Ambulatory Visit: Payer: Medicare Other | Admitting: Physical Therapy

## 2021-12-01 DIAGNOSIS — M542 Cervicalgia: Secondary | ICD-10-CM | POA: Diagnosis not present

## 2021-12-01 DIAGNOSIS — R252 Cramp and spasm: Secondary | ICD-10-CM | POA: Diagnosis not present

## 2021-12-01 DIAGNOSIS — M6281 Muscle weakness (generalized): Secondary | ICD-10-CM | POA: Diagnosis not present

## 2021-12-01 NOTE — Therapy (Signed)
OUTPATIENT PHYSICAL THERAPY TREATMENT NOTE   Patient Name: Kimberly Miles MRN: [id number] DOB:24-Jul-1955, 66 y.o., female Today's Date: 12/01/2021   PT End of Session - 12/01/21 1107     Visit Number 4    Date for PT Re-Evaluation 12/21/21    Authorization Type MCR    Progress Note Due on Visit 10    PT Start Time 1107    PT Stop Time 1152    PT Time Calculation (min) 45 min    Activity Tolerance Patient tolerated treatment well    Behavior During Therapy [facility] for tasks assessed/performed            Rationale for Evaluation and Treatment:  Rehabilitation    Past Medical History:  Diagnosis Date   Anxiety    Arthritis    Cancer ([place]) 11/2018   right breast cancer   Common migraine with intractable migraine 09/73/5329   Complication of anesthesia    Diverticulosis    Elevated LFTs    monitored by pcp per pt --  unknown etiology   Family history of premature CAD 09/01/2013   Family history of prostate cancer    Family history of uterine cancer    History of basal cell carcinoma excision    2004   &  2014   History of colitis    History of palpitations    History of thyroid nodule    s/p  right thyroid lobectomy 03-08-2011  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   IBS (irritable bowel syndrome)    Migraine    MTHFR gene mutation    Personal history of radiation therapy    PMB (postmenopausal bleeding)    PONV (postoperative nausea and vomiting)    Post-surgical hypothyroidism    Past Surgical History:  Procedure Laterality Date   APPENDECTOMY  as child   BREAST BIOPSY Left 1979   benign   BREAST EXCISIONAL BIOPSY Left    BREAST LUMPECTOMY Right 01/2019   BREAST LUMPECTOMY WITH RADIOACTIVE SEED AND SENTINEL LYMPH NODE BIOPSY Right 02/04/2019   Procedure: RIGHT BREAST LUMPECTOMY WITH RADIOACTIVE SEED AND RIGHT AXILLARY DEEP SENTINEL LYMPH NODE BIOPSY WITH BLUE DYE INJECTION;  Surgeon: Fanny Skates, MD;  Location: [place];   Service: General;  Laterality: Right;   BREAST REDUCTION SURGERY Bilateral 02/11/2019   Procedure: RIGHT BREAST ONCOPLASTIC RECONSTRUCTION, LEFT BREAST REDUCTION;  Surgeon: Irene Limbo, MD;  Location: [place];  Service: Plastics;  Laterality: Bilateral;   CARDIOVASCULAR STRESS TEST  09-16-2013   normal nuclear study w/ no ischemia/  normal LV function and wall motion , ef 84%   CESAREAN SECTION  1977 and 1978   Bilateral Tubal Ligation w/ last c/s   COLPOSCOPY  05/2009   CIN 1   HYSTEROSCOPY WITH D & C N/A 12/23/2015   Procedure: DILATATION AND CURETTAGE /HYSTEROSCOPY ;  Surgeon: Megan Salon, MD;  Location: [facility];  Service: Gynecology;  Laterality: N/A;   NEGATIVE SLEEP STUDY  2014  per pt   OOPHORECTOMY Right 1985   ruptured cyst    REDUCTION MAMMAPLASTY Bilateral 01/2019   THYROID LOBECTOMY Right 03-08-2011   TRANSTHORACIC ECHOCARDIOGRAM  05-09-2011   ef 64%/  mild MR and TR   TUBAL LIGATION     Patient Active Problem List   Diagnosis Date Noted   Paresthesia 11/24/2021   Hyperreflexia 11/24/2021   Lower abdominal pain 04/26/2021   Acute cough 04/26/2021   Vitamin D deficiency 04/26/2021  Abdominal bloating 04/26/2021   Age-related osteoporosis without current pathological fracture 04/09/2020   Genetic testing 08/07/2019   Family history of prostate cancer    Family history of uterine cancer    MTHFR gene mutation 01/16/2019   Epigastric pain 01/15/2019   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, estrogen receptor positive ([place]) 12/24/2018   Hypothyroid 11/11/2018   Memory change 11/13/2016   Common migraine with intractable migraine 04/06/2016   H/O partial thyroidectomy 01/14/2014   Family history of premature CAD 09/01/2013   History of migraine headaches 02/21/2013   Hyperlipidemia 02/21/2013   Thyroid function study abnormality 02/21/2013    PCP: Ann Held, DO  REFERRING PROVIDER: Jessy Oto, MD  REFERRING DIAG: M54.2 (ICD-10-CM) - Cervicalgia M47.22 (ICD-10-CM) - Other spondylosis with radiculopathy, cervical region  THERAPY DIAG:  Cervicalgia  Cramp and spasm  Muscle weakness (generalized)  ONSET DATE: 06/26/21  SUBJECTIVE:                                                                                                                                                                                                         SUBJECTIVE STATEMENT: Pt reports neck is doing better, DN helped some but was very sore after.   Had a migraine this weekend, but the neck pain wasn't as bad with it.   No headache currently.   PERTINENT HISTORY:  Migraines, R lumpectomy and radiation 2020/2021,osteoporosis, thyroidectomy (one lobe precancerous)  PAIN:  Are you having pain? Yes: NPRS scale: 3/10 Pain location: R side neck     PRECAUTIONS: Other: h/o R lumpectomy and radiation 2020/2021,osteoporosis  PATIENT GOALS get rid of the numbness and tingling  OBJECTIVE: (objective measurements taken at eval unless otherwise dated)   DIAGNOSTIC FINDINGS:  XR 11/03/21: mild degenerative disc disease C5-6 and C6-7. There is uncovertebral hypertrophy at the C5-6 level and mild C6-7. Some reversal of the lover one half of the cervical spine. MRI 10/15/21: 1. At C5-6 there is a tiny right paracentral disc protrusion. Bilateral uncovertebral degenerative changes. Moderate bilateral foraminal stenosis. 2. At C6-7 there is a mild broad-based disc bulge. Bilateral uncovertebral degenerative changes. Mild bilateral foraminal stenosis.  PATIENT SURVEYS:  FOTO FS = 70 (predicted 71)  SENSATION: Decreased sensation in ant neck due to thryroid surgery Some decreased sensation in right scapula and b/w spine and scapula  POSTURE:  Rounded shoulders, else WNL  PALPATION: Increased muscle tension in B UT and right cervical paraspinals and subocciptals Decreased R sided lateral glides and  R UPA mobility in  C2/3, 3/4, C5/6  CERVICAL ROM:    ROM A/PROM (deg) 11/09/2021  Flexion full  Extension full  Right lateral flexion 29 / full  Left lateral flexion 13 / full but tight  Right rotation 72 Pain on R / full  Left rotation 54 Pull on L down to scapula / full   (Blank rows = not tested)  UE ROM: WNL     UE MMT: 5/5 UE and neck except deep neck flexors 5-/5   Mid traps R 5/5 L 4+/5   Low traps R 4+/5 L 4/5  CERVICAL SPECIAL TESTS:  Negative compression/distraction   TODAY'S TREATMENT:  12/01/2021 Therapeutic Exercise: to improve strength and mobility.  Demo, verbal and tactile cues throughout for technique. Nustep L5 x 6 min Cervical Snags - extension and rotation, Self release levator with dynamic arm movement, Levator stretch, open book stretches SL x 10 bil, shoulder rolls retro  Manual Therapy: to decrease muscle spasm and pain and improve mobility STM/TPR to bil UT, LS and cervical paraspinals. Trigger Point Dry-Needling  Treatment instructions: Expect mild to moderate muscle soreness. Patient verbalized understanding of these instructions and education.  Patient Consent Given: Yes Education handout provided: Previously provided Muscles treated: bil UT, L/S, cervical multifid C3-6 bil Electrical stimulation performed: No Parameters: N/A Treatment response/outcome: Twitch Response Elicited and Palpable Increase in Muscle Length  11/29/21 Therapeutic exercise: Nustep L3x74mn LS stretch 2x30" bil Seated thoracic extension over chair 10x3" Seated thoracic rotation 10x2" Seated shoulder squeezes 10x3" Seated B shoulder ER no resistance 10x3"  Manual Therapy: STM to B UT, LS, rhomboids Modalities: 10 min moist heat to cervical region post session    11/22/21   Therapeutic Exercise:   Aerobic: UBE x 6 min (3 min fwd/3 min bwd) Prone: T's x 10 ea  Seated: UT, LS and ant neck stretches 2x30 sec ea bil with VCs required for correct form    Manual  Therapy:  STM to bil UT, LS, cervical paraspinals and suboccipitals; skilled palpation and monitoring of soft tissues during DN    Trigger Point Dry-Needling  Treatment instructions: Expect mild to moderate muscle soreness. S/S of pneumothorax if dry needled over a lung field, and to seek immediate medical attention should they occur. Patient verbalized understanding of these instructions and education.  Patient Consent Given: Yes Education handout provided: Previously provided Muscles treated: left suboccipitals, bil cervical multifidi, bil UT, R LS Electrical stimulation performed: No Parameters: N/A Treatment response/outcome: Twitch Response Elicited and Palpable Increase in Muscle Length    PATIENT EDUCATION:  Education details: HEP update  Person educated: Patient Education method: Explanation, Demonstration, and Handouts Education comprehension: verbalized understanding and returned demonstration   HOME EXERCISE PROGRAM: Access Code: [email or web address]   ASSESSMENT:  CLINICAL IMPRESSION: PKhalis Hittlereported continued headaches and neck pain, but is more aware of posture and has been compliant with HEP.  Today focused on manual therapy first followed by neck/thoracic stretches/mobilization to see if improves complaint of soreness following DN.  She tolerated interventions well and reported decreased tightness following.    OBJECTIVE IMPAIRMENTS decreased ROM, decreased strength, hypomobility, increased muscle spasms, impaired flexibility, impaired UE functional use, and postural dysfunction.   ACTIVITY LIMITATIONS  OH activity .   PERSONAL FACTORS Time since onset of injury/illness/exacerbation and 3+ comorbidities: OP, migraines, h/o thyroidectomy and radiation for breast CA  are also affecting patient's functional outcome.    GOALS: Goals reviewed with patient? Yes  SHORT TERM GOALS: Target date: 11/23/2021   Patient  will be independent with initial HEP.  Baseline:   Goal status: MET   LONG TERM GOALS: Target date: 12/21/2021 (Remove Blue Hyperlink)  Patient will be independent with advanced/ongoing HEP to improve outcomes and carryover.  Baseline:  Goal status: IN PROGRESS  2.  Patient will report 75% improvement in neck/back tingling to improve QOL.  Baseline:  Goal status: IN PROGRESS  3.  Patient will demonstrate full cervical rotation for safety with driving and left lateral SB within 5 deg of R indicating improved flexibility.  Baseline:  Goal status: IN PROGRESS  4.  Patient will demonstrate improved posture to decrease muscle imbalance. Baseline:  Goal status: IN PROGRESS  5.  Patient will be able to work [facility] with 75% less difficulty.    Baseline:  Goal status: IN PROGRESS    PLAN: PT FREQUENCY: 2x/week  PT DURATION: 6 weeks  PLANNED INTERVENTIONS: Therapeutic exercises, Therapeutic activity, Neuromuscular re-education, Patient/Family education, Dry Needling, Electrical stimulation, Spinal mobilization, Cryotherapy, Moist heat, Ultrasound, and Manual therapy  PLAN FOR NEXT SESSION:  Progress HEP as needed, DN/MT, neck flexibility, upper back/postural strength, cervical stab   Rennie Natter, PT, DPT  12/01/2021, 12:00 PM

## 2021-12-02 ENCOUNTER — Ambulatory Visit
Admission: RE | Admit: 2021-12-02 | Discharge: 2021-12-02 | Disposition: A | Discharge status: Home / Self Care | Payer: Medicare Other | Source: Ambulatory Visit | Attending: Neurology | Admitting: Neurology

## 2021-12-02 ENCOUNTER — Ambulatory Visit: Payer: Medicare Other | Admitting: Specialist

## 2021-12-02 DIAGNOSIS — R202 Paresthesia of skin: Secondary | ICD-10-CM

## 2021-12-02 DIAGNOSIS — R292 Abnormal reflex: Secondary | ICD-10-CM

## 2021-12-02 MED ORDER — GADOBENATE DIMEGLUMINE 529 MG/ML IV SOLN
9.0000 mL | Freq: Once | INTRAVENOUS | Status: AC | PRN
  Administered 2021-12-02: 9 mL via INTRAVENOUS

## 2021-12-05 ENCOUNTER — Ambulatory Visit: Payer: Medicare Other | Admitting: Physical Therapy

## 2021-12-05 ENCOUNTER — Encounter: Payer: Self-pay | Admitting: Physical Therapy

## 2021-12-05 DIAGNOSIS — R252 Cramp and spasm: Secondary | ICD-10-CM

## 2021-12-05 DIAGNOSIS — M6281 Muscle weakness (generalized): Secondary | ICD-10-CM | POA: Diagnosis not present

## 2021-12-05 DIAGNOSIS — M542 Cervicalgia: Secondary | ICD-10-CM | POA: Diagnosis not present

## 2021-12-05 NOTE — Therapy (Signed)
OUTPATIENT PHYSICAL THERAPY TREATMENT NOTE   Patient Name: Kimberly Miles MRN: [id number] DOB:[birth year], 66 y.o., female Today's Date: 12/05/2021   PT End of Session - 12/05/21 1106     Visit Number 5    Date for PT Re-Evaluation 12/21/21    Authorization Type MCR    Progress Note Due on Visit 10    PT Start Time 1103    PT Stop Time 1150    PT Time Calculation (min) 47 min    Activity Tolerance Patient tolerated treatment well    Behavior During Therapy [facility] for tasks assessed/performed            Rationale for Evaluation and Treatment:  Rehabilitation    Past Medical History:  Diagnosis Date   Anxiety    Arthritis    Cancer ([place]) 11/2018   right breast cancer   Common migraine with intractable migraine 85/07/7739   Complication of anesthesia    Diverticulosis    Elevated LFTs    monitored by pcp per pt --  unknown etiology   Family history of premature CAD 09/01/2013   Family history of prostate cancer    Family history of uterine cancer    History of basal cell carcinoma excision    2004   &  2014   History of colitis    History of palpitations    History of thyroid nodule    s/p  right thyroid lobectomy 03-08-2011  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   IBS (irritable bowel syndrome)    Migraine    MTHFR gene mutation    Personal history of radiation therapy    PMB (postmenopausal bleeding)    PONV (postoperative nausea and vomiting)    Post-surgical hypothyroidism    Past Surgical History:  Procedure Laterality Date   APPENDECTOMY  as child   BREAST BIOPSY Left 1979   benign   BREAST EXCISIONAL BIOPSY Left    BREAST LUMPECTOMY Right 01/2019   BREAST LUMPECTOMY WITH RADIOACTIVE SEED AND SENTINEL LYMPH NODE BIOPSY Right 02/04/2019   Procedure: RIGHT BREAST LUMPECTOMY WITH RADIOACTIVE SEED AND RIGHT AXILLARY DEEP SENTINEL LYMPH NODE BIOPSY WITH BLUE DYE INJECTION;  Surgeon: Fanny Skates, MD;  Location: [place];   Service: General;  Laterality: Right;   BREAST REDUCTION SURGERY Bilateral 02/11/2019   Procedure: RIGHT BREAST ONCOPLASTIC RECONSTRUCTION, LEFT BREAST REDUCTION;  Surgeon: Irene Limbo, MD;  Location: [place];  Service: Plastics;  Laterality: Bilateral;   CARDIOVASCULAR STRESS TEST  09-16-2013   normal nuclear study w/ no ischemia/  normal LV function and wall motion , ef 84%   CESAREAN SECTION  1977 and 1978   Bilateral Tubal Ligation w/ last c/s   COLPOSCOPY  05/2009   CIN 1   HYSTEROSCOPY WITH D & C N/A 12/23/2015   Procedure: DILATATION AND CURETTAGE /HYSTEROSCOPY ;  Surgeon: Megan Salon, MD;  Location: [facility];  Service: Gynecology;  Laterality: N/A;   NEGATIVE SLEEP STUDY  2014  per pt   OOPHORECTOMY Right 1985   ruptured cyst    REDUCTION MAMMAPLASTY Bilateral 01/2019   THYROID LOBECTOMY Right 03-08-2011   TRANSTHORACIC ECHOCARDIOGRAM  05-09-2011   ef 64%/  mild MR and TR   TUBAL LIGATION     Patient Active Problem List   Diagnosis Date Noted   Paresthesia 11/24/2021   Hyperreflexia 11/24/2021   Lower abdominal pain 04/26/2021   Acute cough 04/26/2021   Vitamin D deficiency 04/26/2021  Abdominal bloating 04/26/2021   Age-related osteoporosis without current pathological fracture 04/09/2020   Genetic testing 08/07/2019   Family history of prostate cancer    Family history of uterine cancer    MTHFR gene mutation 01/16/2019   Epigastric pain 01/15/2019   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, estrogen receptor positive ([place]) 12/24/2018   Hypothyroid 11/11/2018   Memory change 11/13/2016   Common migraine with intractable migraine 04/06/2016   H/O partial thyroidectomy 01/14/2014   Family history of premature CAD 09/01/2013   History of migraine headaches 02/21/2013   Hyperlipidemia 02/21/2013   Thyroid function study abnormality 02/21/2013    PCP: Ann Held, DO  REFERRING PROVIDER: Jessy Oto, MD  REFERRING DIAG: M54.2 (ICD-10-CM) - Cervicalgia M47.22 (ICD-10-CM) - Other spondylosis with radiculopathy, cervical region  THERAPY DIAG:  Cervicalgia  Cramp and spasm  Muscle weakness (generalized)  ONSET DATE: 06/26/21  SUBJECTIVE:                                                                                                                                                                                                         SUBJECTIVE STATEMENT: Pt reports neck is doing better, wasn't as sore after DN this time.  Planning on going wakeboarding tomorrow.   Reported some of stretches may her R arm hurt.   PERTINENT HISTORY:  Migraines, R lumpectomy and radiation 2020/2021,osteoporosis, thyroidectomy (one lobe precancerous)  PAIN:  Are you having pain? Yes: NPRS scale: 3/10 Pain location: R thoracic spine     PRECAUTIONS: Other: h/o R lumpectomy and radiation 2020/2021,osteoporosis  PATIENT GOALS get rid of the numbness and tingling  OBJECTIVE: (objective measurements taken at eval unless otherwise dated)   DIAGNOSTIC FINDINGS:  XR 11/03/21: mild degenerative disc disease C5-6 and C6-7. There is uncovertebral hypertrophy at the C5-6 level and mild C6-7. Some reversal of the lover one half of the cervical spine. MRI 10/15/21: 1. At C5-6 there is a tiny right paracentral disc protrusion. Bilateral uncovertebral degenerative changes. Moderate bilateral foraminal stenosis. 2. At C6-7 there is a mild broad-based disc bulge. Bilateral uncovertebral degenerative changes. Mild bilateral foraminal stenosis.  PATIENT SURVEYS:  FOTO FS = 70 (predicted 71)  SENSATION: Decreased sensation in ant neck due to thryroid surgery Some decreased sensation in right scapula and b/w spine and scapula  POSTURE:  Rounded shoulders, else WNL  PALPATION: Increased muscle tension in B UT and right cervical paraspinals and subocciptals Decreased R sided lateral glides and R UPA  mobility in C2/3, 3/4, C5/6  CERVICAL  ROM:    ROM A/PROM (deg) 11/09/2021  Flexion full  Extension full  Right lateral flexion 29 / full  Left lateral flexion 13 / full but tight  Right rotation 72 Pain on R / full  Left rotation 54 Pull on L down to scapula / full   (Blank rows = not tested)  UE ROM: WNL     UE MMT: 5/5 UE and neck except deep neck flexors 5-/5   Mid traps R 5/5 L 4+/5   Low traps R 4+/5 L 4/5  CERVICAL SPECIAL TESTS:  Negative compression/distraction   TODAY'S TREATMENT:  12/05/2021 Therapeutic Exercise: to improve strength and mobility.  Demo, verbal and tactile cues throughout for technique. UBE L1 x 6 min (3f3b) Thoracic self mobs on foam roller Pec stretch on foam roller Arm flexion on foam roller alternating arms.  Rows RTB 2 x 10  Shoulder extensions RTB 2 x 10  Bil ER RTB 2 x 10  Manual Therapy: to decrease muscle spasm and pain and improve mobility.  PA mobs cervical spine, NAGS into rotation, PA mobs thoracic spine, TPR R UT, pec, lats   12/01/2021 Therapeutic Exercise: to improve strength and mobility.  Demo, verbal and tactile cues throughout for technique. Nustep L5 x 6 min Cervical Snags - extension and rotation, Self release levator with dynamic arm movement, Levator stretch, open book stretches SL x 10 bil, shoulder rolls retro  Manual Therapy: to decrease muscle spasm and pain and improve mobility STM/TPR to bil UT, LS and cervical paraspinals. Trigger Point Dry-Needling  Treatment instructions: Expect mild to moderate muscle soreness. Patient verbalized understanding of these instructions and education.  Patient Consent Given: Yes Education handout provided: Previously provided Muscles treated: bil UT, L/S, cervical multifid C3-6 bil Electrical stimulation performed: No Parameters: N/A Treatment response/outcome: Twitch Response Elicited and Palpable Increase in Muscle Length  11/29/21 Therapeutic exercise: Nustep L3x632m LS  stretch 2x30" bil Seated thoracic extension over chair 10x3" Seated thoracic rotation 10x2" Seated shoulder squeezes 10x3" Seated B shoulder ER no resistance 10x3"  Manual Therapy: STM to B UT, LS, rhomboids Modalities: 10 min moist heat to cervical region post session     PATIENT EDUCATION:  Education details: HEP update, reviewed stretches, how to perform UT stretch without overhead arm movement. RTB issued.  Discussed using compression garment with exercise to prevent swelling.  Person educated: Patient Education method: Explanation, Demonstration, and Handouts Education comprehension: verbalized understanding and returned demonstration   HOME EXERCISE PROGRAM: Access Code: [email or web address]   ASSESSMENT:  CLINICAL IMPRESSION: PaMikahla Wisors making good progress, reports pain more in thoracic spine now than shoulder/neck, noted tightness throughout R shoulder musculature.  Tolerated progression of HEP today for more posterior shoulder strengthening without complaint.  Reported poor tolerance to UT stretch using R arm to assist with L UT stretch, recommended stretching with arms behind back or just stretch L side.  She would benefit from continued skilled therapy.   OBJECTIVE IMPAIRMENTS decreased ROM, decreased strength, hypomobility, increased muscle spasms, impaired flexibility, impaired UE functional use, and postural dysfunction.   ACTIVITY LIMITATIONS  OH activity .   PERSONAL FACTORS Time since onset of injury/illness/exacerbation and 3+ comorbidities: OP, migraines, h/o thyroidectomy and radiation for breast CA  are also affecting patient's functional outcome.    GOALS: Goals reviewed with patient? Yes  SHORT TERM GOALS: Target date: 11/23/2021   Patient will be independent with initial HEP.  Baseline:  Goal status: MET   LONG TERM GOALS: Target  date: 12/21/2021 (Remove Blue Hyperlink)  Patient will be independent with advanced/ongoing HEP to improve outcomes  and carryover.  Baseline:  Goal status: IN PROGRESS  2.  Patient will report 75% improvement in neck/back tingling to improve QOL.  Baseline:  Goal status: IN PROGRESS  3.  Patient will demonstrate full cervical rotation for safety with driving and left lateral SB within 5 deg of R indicating improved flexibility.  Baseline:  Goal status: IN PROGRESS  4.  Patient will demonstrate improved posture to decrease muscle imbalance. Baseline:  Goal status: IN PROGRESS  5.  Patient will be able to work [facility] with 75% less difficulty.    Baseline:  Goal status: IN PROGRESS    PLAN: PT FREQUENCY: 2x/week  PT DURATION: 6 weeks  PLANNED INTERVENTIONS: Therapeutic exercises, Therapeutic activity, Neuromuscular re-education, Patient/Family education, Dry Needling, Electrical stimulation, Spinal mobilization, Cryotherapy, Moist heat, Ultrasound, and Manual therapy  PLAN FOR NEXT SESSION:  Progress HEP as needed, DN/MT, neck flexibility, upper back/postural strength, cervical stab   Rennie Natter, PT, DPT  12/05/2021, 12:02 PM

## 2021-12-06 ENCOUNTER — Telehealth: Payer: Self-pay | Admitting: Neurology

## 2021-12-06 NOTE — Telephone Encounter (Signed)
Patient MRI of thoracic spine showed no significant abnormality  Cystic abdominal mass, which was present in 2017 lumbar MRI, but has increased in size,  I have forwarded the result to her primary care physician Carollee Herter, Alferd Apa, DO She should contact him for further evaluation   IMPRESSION: This MRI of the thoracic spine with and without contrast shows the following: 1.   The spinal cord appears normal before and after contrast. 2.   Mild degenerative changes in the lower cervical spine not leading to spinal stenosis or nerve root compression. 3.   Cystic abdominal mass.  It was present on the 2017 lumbar MRI but has increased in size.  If clinically indicated, this might be better evaluated on CT or ultrasound study 4.   Normal enhancement pattern.

## 2021-12-07 NOTE — Telephone Encounter (Signed)
I spoke with the patient and informed her of the message. She verbalized understanding and expressed appreciation for the call. All questions answered.

## 2021-12-07 NOTE — Telephone Encounter (Signed)
I spoke with the patient. She verbalized understanding of the findings and expressed appreciation for the call. Will follow up with PCP regarding cystic abdominal mass.  Pt asked if Dr. Krista Blue could note any changes between her 2017 MRI of the lumbar spine that showed a bulging disc and the most recent MRI of thoracic spine.

## 2021-12-07 NOTE — Telephone Encounter (Signed)
Please let patient know, MRI of lumbar thoracic spine to focus on different part of the spinal cord, it is hard to compare

## 2021-12-09 ENCOUNTER — Ambulatory Visit: Payer: Medicare Other | Admitting: Physical Therapy

## 2021-12-09 ENCOUNTER — Encounter: Payer: Self-pay | Admitting: Physical Therapy

## 2021-12-09 DIAGNOSIS — M6281 Muscle weakness (generalized): Secondary | ICD-10-CM | POA: Diagnosis not present

## 2021-12-09 DIAGNOSIS — R252 Cramp and spasm: Secondary | ICD-10-CM | POA: Diagnosis not present

## 2021-12-09 DIAGNOSIS — M542 Cervicalgia: Secondary | ICD-10-CM

## 2021-12-09 NOTE — Therapy (Signed)
OUTPATIENT PHYSICAL THERAPY TREATMENT NOTE   Patient Name: Kimberly Miles MRN: [id number] DOB:April 15, 1956, 66 y.o., female Today's Date: 12/09/2021   PT End of Session - 12/09/21 1105     Visit Number 6    Date for PT Re-Evaluation 12/21/21    Authorization Type MCR    Progress Note Due on Visit 10    PT Start Time 1103    PT Stop Time 1150    PT Time Calculation (min) 47 min    Activity Tolerance Patient tolerated treatment well    Behavior During Therapy [facility] for tasks assessed/performed            Rationale for Evaluation and Treatment:  Rehabilitation    Past Medical History:  Diagnosis Date   Anxiety    Arthritis    Cancer ([place]) 11/2018   right breast cancer   Common migraine with intractable migraine 21/19/4174   Complication of anesthesia    Diverticulosis    Elevated LFTs    monitored by pcp per pt --  unknown etiology   Family history of premature CAD 09/01/2013   Family history of prostate cancer    Family history of uterine cancer    History of basal cell carcinoma excision    2004   &  2014   History of colitis    History of palpitations    History of thyroid nodule    s/p  right thyroid lobectomy 03-08-2011  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   IBS (irritable bowel syndrome)    Migraine    MTHFR gene mutation    Personal history of radiation therapy    PMB (postmenopausal bleeding)    PONV (postoperative nausea and vomiting)    Post-surgical hypothyroidism    Past Surgical History:  Procedure Laterality Date   APPENDECTOMY  as child   BREAST BIOPSY Left 1979   benign   BREAST EXCISIONAL BIOPSY Left    BREAST LUMPECTOMY Right 01/2019   BREAST LUMPECTOMY WITH RADIOACTIVE SEED AND SENTINEL LYMPH NODE BIOPSY Right 02/04/2019   Procedure: RIGHT BREAST LUMPECTOMY WITH RADIOACTIVE SEED AND RIGHT AXILLARY DEEP SENTINEL LYMPH NODE BIOPSY WITH BLUE DYE INJECTION;  Surgeon: Fanny Skates, MD;  Location: [place];   Service: General;  Laterality: Right;   BREAST REDUCTION SURGERY Bilateral 02/11/2019   Procedure: RIGHT BREAST ONCOPLASTIC RECONSTRUCTION, LEFT BREAST REDUCTION;  Surgeon: Irene Limbo, MD;  Location: [place];  Service: Plastics;  Laterality: Bilateral;   CARDIOVASCULAR STRESS TEST  09-16-2013   normal nuclear study w/ no ischemia/  normal LV function and wall motion , ef 84%   CESAREAN SECTION  1977 and 1978   Bilateral Tubal Ligation w/ last c/s   COLPOSCOPY  05/2009   CIN 1   HYSTEROSCOPY WITH D & C N/A 12/23/2015   Procedure: DILATATION AND CURETTAGE /HYSTEROSCOPY ;  Surgeon: Megan Salon, MD;  Location[facility];  Service: Gynecology;  Laterality: N/A;   NEGATIVE SLEEP STUDY  2014  per pt   OOPHORECTOMY Right 1985   ruptured cyst    REDUCTION MAMMAPLASTY Bilateral 01/2019   THYROID LOBECTOMY Right 03-08-2011   TRANSTHORACIC ECHOCARDIOGRAM  05-09-2011   ef 64%/  mild MR and TR   TUBAL LIGATION     Patient Active Problem List   Diagnosis Date Noted   Paresthesia 11/24/2021   Hyperreflexia 11/24/2021   Lower abdominal pain 04/26/2021   Acute cough 04/26/2021   Vitamin D deficiency 04/26/2021  Abdominal bloating 04/26/2021   Age-related osteoporosis without current pathological fracture 04/09/2020   Genetic testing 08/07/2019   Family history of prostate cancer    Family history of uterine cancer    MTHFR gene mutation 01/16/2019   Epigastric pain 01/15/2019   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, estrogen receptor positive ([place]) 12/24/2018   Hypothyroid 11/11/2018   Memory change 11/13/2016   Common migraine with intractable migraine 04/06/2016   H/O partial thyroidectomy 01/14/2014   Family history of premature CAD 09/01/2013   History of migraine headaches 02/21/2013   Hyperlipidemia 02/21/2013   Thyroid function study abnormality 02/21/2013    PCP: Ann Held, DO  REFERRING PROVIDER: Jessy Oto, MD  REFERRING DIAG: M54.2 (ICD-10-CM) - Cervicalgia M47.22 (ICD-10-CM) - Other spondylosis with radiculopathy, cervical region  THERAPY DIAG:  Cervicalgia  Cramp and spasm  Muscle weakness (generalized)  ONSET DATE: 06/26/21  SUBJECTIVE:                                                                                                                                                                                                         SUBJECTIVE STATEMENT: Pt went wake surfing yesterday, feels pretty good today.  Still gets tingling in anterior neck/lower face.  Had MRI of thoracic spine which demonstrated only mild degenerative changes without stenosis or nerve root compression.    PERTINENT HISTORY:  Migraines, R lumpectomy and radiation 2020/2021,osteoporosis, thyroidectomy (one lobe precancerous)  PAIN:  Are you having pain? Yes: NPRS scale: 2-3/10 Pain location: soreness R side of neck      PRECAUTIONS: Other: h/o R lumpectomy and radiation 2020/2021,osteoporosis  PATIENT GOALS get rid of the numbness and tingling  OBJECTIVE: (objective measurements taken at eval unless otherwise dated)   DIAGNOSTIC FINDINGS:  XR 11/03/21: mild degenerative disc disease C5-6 and C6-7. There is uncovertebral hypertrophy at the C5-6 level and mild C6-7. Some reversal of the lover one half of the cervical spine. MRI 10/15/21: 1. At C5-6 there is a tiny right paracentral disc protrusion. Bilateral uncovertebral degenerative changes. Moderate bilateral foraminal stenosis. 2. At C6-7 there is a mild broad-based disc bulge. Bilateral uncovertebral degenerative changes. Mild bilateral foraminal stenosis. 12/02/2021 IMPRESSION: This MRI of the thoracic spine with and without contrast shows the following: 1.   The spinal cord appears normal before and after contrast. 2.   Mild degenerative changes in the lower cervical spine not leading to spinal stenosis or nerve root compression.  PATIENT  SURVEYS:  FOTO FS = 70 (predicted 71)  SENSATION:  Decreased sensation in ant neck due to thryroid surgery Some decreased sensation in right scapula and b/w spine and scapula  POSTURE:  Rounded shoulders, else WNL  PALPATION: Increased muscle tension in B UT and right cervical paraspinals and subocciptals Decreased R sided lateral glides and R UPA mobility in C2/3, 3/4, C5/6  CERVICAL ROM:    ROM A/PROM (deg) 11/09/2021  Flexion full  Extension full  Right lateral flexion 29 / full  Left lateral flexion 13 / full but tight  Right rotation 72 Pain on R / full  Left rotation 54 Pull on L down to scapula / full   (Blank rows = not tested)  UE ROM: WNL     UE MMT: 5/5 UE and neck except deep neck flexors 5-/5   Mid traps R 5/5 L 4+/5   Low traps R 4+/5 L 4/5  CERVICAL SPECIAL TESTS:  Negative compression/distraction   TODAY'S TREATMENT:  12/09/2021 Therapeutic Exercise: to improve strength and mobility.  Demo, verbal and tactile cues throughout for technique.  UBE L2 x 6 min (32f3b) Cat cows x 10 Thread the needle x 10 bil  Cobra press-ups x 10  Thoracic self mobs with foam roller Manual Therapy: to decrease muscle spasm and pain and improve mobility STM/TPR to R masseter STM/TPR to bil suboccipitals STM/TPR bil UT/levator scapulae PA mobs thoracic spine skilled palpation and monitoring during dry needling. Trigger Point Dry-Needling  Treatment instructions: Expect mild to moderate muscle soreness. S/S of pneumothorax if dry needled over a lung field, and to seek immediate medical attention should they occur. Patient verbalized understanding of these instructions and education.  Patient Consent Given: Yes Education handout provided: Previously provided Muscles treated: R levator scapulae Electrical stimulation performed: No Parameters: N/A Treatment response/outcome: Twitch Response Elicited and Palpable Increase in Muscle Length    12/05/2021 Therapeutic  Exercise: to improve strength and mobility.  Demo, verbal and tactile cues throughout for technique. UBE L1 x 6 min (328fb) Thoracic self mobs on foam roller Pec stretch on foam roller Arm flexion on foam roller alternating arms.  Rows RTB 2 x 10  Shoulder extensions RTB 2 x 10  Bil ER RTB 2 x 10  Manual Therapy: to decrease muscle spasm and pain and improve mobility.  PA mobs cervical spine, NAGS into rotation, PA mobs thoracic spine, TPR R UT, pec, lats   12/01/2021 Therapeutic Exercise: to improve strength and mobility.  Demo, verbal and tactile cues throughout for technique. Nustep L5 x 6 min Cervical Snags - extension and rotation, Self release levator with dynamic arm movement, Levator stretch, open book stretches SL x 10 bil, shoulder rolls retro  Manual Therapy: to decrease muscle spasm and pain and improve mobility STM/TPR to bil UT, LS and cervical paraspinals. Trigger Point Dry-Needling  Treatment instructions: Expect mild to moderate muscle soreness. Patient verbalized understanding of these instructions and education.  Patient Consent Given: Yes Education handout provided: Previously provided Muscles treated: bil UT, L/S, cervical multifid C3-6 bil Electrical stimulation performed: No Parameters: N/A Treatment response/outcome: Twitch Response Elicited and Palpable Increase in Muscle Length     PATIENT EDUCATION:  Education details: HEP update, reviewed stretches, how to perform UT stretch without overhead arm movement. RTB issued.  Discussed using compression garment with exercise to prevent swelling.  Person educated: Patient Education method: Explanation, Demonstration, and Handouts Education comprehension: verbalized understanding and returned demonstration   HOME EXERCISE PROGRAM: Access Code: [email or web address]   ASSESSMENT:  CLINICAL IMPRESSION: PaMakinsey Pepitoneeports  frequent tingling in face and back, was surprised by MRI results showing no stenosis.   Discussed that tightness along sensory nerve tracks, like trigeminal nerve, could be causing some of her symptoms.  Noted today C shape jaw opening and clicking in L TMJ, tightness in R TMJ, and trigger points in R masseter.   Focused TPR in R masseter today along with suboccipitals and levator scapulae.  Tolerated exercises well, reporting relief with thoracic self mobs.   She would benefit from continued skilled therapy.    OBJECTIVE IMPAIRMENTS decreased ROM, decreased strength, hypomobility, increased muscle spasms, impaired flexibility, impaired UE functional use, and postural dysfunction.   ACTIVITY LIMITATIONS  OH activity .   PERSONAL FACTORS Time since onset of injury/illness/exacerbation and 3+ comorbidities: OP, migraines, h/o thyroidectomy and radiation for breast CA  are also affecting patient's functional outcome.    GOALS: Goals reviewed with patient? Yes  SHORT TERM GOALS: Target date: 11/23/2021   Patient will be independent with initial HEP.  Baseline:  Goal status: MET   LONG TERM GOALS: Target date: 12/21/2021 (Remove Blue Hyperlink)  Patient will be independent with advanced/ongoing HEP to improve outcomes and carryover.  Baseline:  Goal status: IN PROGRESS  2.  Patient will report 75% improvement in neck/back tingling to improve QOL.  Baseline:  Goal status: IN PROGRESS  3.  Patient will demonstrate full cervical rotation for safety with driving and left lateral SB within 5 deg of R indicating improved flexibility.  Baseline:  Goal status: IN PROGRESS  4.  Patient will demonstrate improved posture to decrease muscle imbalance. Baseline:  Goal status: IN PROGRESS  5.  Patient will be able to work [facility] with 75% less difficulty.    Baseline:  Goal status: IN PROGRESS    PLAN: PT FREQUENCY: 2x/week  PT DURATION: 6 weeks  PLANNED INTERVENTIONS: Therapeutic exercises, Therapeutic activity, Neuromuscular re-education, Patient/Family education, Dry Needling,  Electrical stimulation, Spinal mobilization, Cryotherapy, Moist heat, Ultrasound, and Manual therapy  PLAN FOR NEXT SESSION:  Progress HEP as needed, DN/MT, neck flexibility, upper back/postural strength, cervical stab   Rennie Natter, PT, DPT  12/09/2021, 12:00 PM

## 2021-12-12 ENCOUNTER — Encounter: Payer: Self-pay | Admitting: Physical Therapy

## 2021-12-12 ENCOUNTER — Ambulatory Visit: Payer: Medicare Other | Admitting: Physical Therapy

## 2021-12-12 DIAGNOSIS — R252 Cramp and spasm: Secondary | ICD-10-CM | POA: Diagnosis not present

## 2021-12-12 DIAGNOSIS — M6281 Muscle weakness (generalized): Secondary | ICD-10-CM

## 2021-12-12 DIAGNOSIS — M542 Cervicalgia: Secondary | ICD-10-CM | POA: Diagnosis not present

## 2021-12-12 NOTE — Therapy (Signed)
OUTPATIENT PHYSICAL THERAPY TREATMENT NOTE   Patient Name: Kimberly Miles MRN: [id number] DOB:[birth year], 66 y.o., female Today's Date: 12/12/2021   PT End of Session - 12/12/21 1104     Visit Number 7    Date for PT Re-Evaluation 12/21/21    Authorization Type MCR    Progress Note Due on Visit 10    PT Start Time 1103    PT Stop Time 1146    PT Time Calculation (min) 43 min    Activity Tolerance Patient tolerated treatment well    Behavior During Therapy [facility] for tasks assessed/performed            Rationale for Evaluation and Treatment:  Rehabilitation    Past Medical History:  Diagnosis Date   Anxiety    Arthritis    Cancer ([place]) 11/2018   right breast cancer   Common migraine with intractable migraine 38/88/2800   Complication of anesthesia    Diverticulosis    Elevated LFTs    monitored by pcp per pt --  unknown etiology   Family history of premature CAD 09/01/2013   Family history of prostate cancer    Family history of uterine cancer    History of basal cell carcinoma excision    2004   &  2014   History of colitis    History of palpitations    History of thyroid nodule    s/p  right thyroid lobectomy 03-08-2011  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   IBS (irritable bowel syndrome)    Migraine    MTHFR gene mutation    Personal history of radiation therapy    PMB (postmenopausal bleeding)    PONV (postoperative nausea and vomiting)    Post-surgical hypothyroidism    Past Surgical History:  Procedure Laterality Date   APPENDECTOMY  as child   BREAST BIOPSY Left 1979   benign   BREAST EXCISIONAL BIOPSY Left    BREAST LUMPECTOMY Right 01/2019   BREAST LUMPECTOMY WITH RADIOACTIVE SEED AND SENTINEL LYMPH NODE BIOPSY Right 02/04/2019   Procedure: RIGHT BREAST LUMPECTOMY WITH RADIOACTIVE SEED AND RIGHT AXILLARY DEEP SENTINEL LYMPH NODE BIOPSY WITH BLUE DYE INJECTION;  Surgeon: Fanny Skates, MD;  Location: [place];   Service: General;  Laterality: Right;   BREAST REDUCTION SURGERY Bilateral 02/11/2019   Procedure: RIGHT BREAST ONCOPLASTIC RECONSTRUCTION, LEFT BREAST REDUCTION;  Surgeon: Irene Limbo, MD;  Location: [place];  Service: Plastics;  Laterality: Bilateral;   CARDIOVASCULAR STRESS TEST  09-16-2013   normal nuclear study w/ no ischemia/  normal LV function and wall motion , ef 84%   CESAREAN SECTION  1977 and 1978   Bilateral Tubal Ligation w/ last c/s   COLPOSCOPY  05/2009   CIN 1   HYSTEROSCOPY WITH D & C N/A 12/23/2015   Procedure: DILATATION AND CURETTAGE /HYSTEROSCOPY ;  Surgeon: Megan Salon, MD;  Location[facility];  Service: Gynecology;  Laterality: N/A;   NEGATIVE SLEEP STUDY  2014  per pt   OOPHORECTOMY Right 1985   ruptured cyst    REDUCTION MAMMAPLASTY Bilateral 01/2019   THYROID LOBECTOMY Right 03-08-2011   TRANSTHORACIC ECHOCARDIOGRAM  05-09-2011   ef 64%/  mild MR and TR   TUBAL LIGATION     Patient Active Problem List   Diagnosis Date Noted   Paresthesia 11/24/2021   Hyperreflexia 11/24/2021   Lower abdominal pain 04/26/2021   Acute cough 04/26/2021   Vitamin D deficiency 04/26/2021  Abdominal bloating 04/26/2021   Age-related osteoporosis without current pathological fracture 04/09/2020   Genetic testing 08/07/2019   Family history of prostate cancer    Family history of uterine cancer    MTHFR gene mutation 01/16/2019   Epigastric pain 01/15/2019   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, estrogen receptor positive ([place]) 12/24/2018   Hypothyroid 11/11/2018   Memory change 11/13/2016   Common migraine with intractable migraine 04/06/2016   H/O partial thyroidectomy 01/14/2014   Family history of premature CAD 09/01/2013   History of migraine headaches 02/21/2013   Hyperlipidemia 02/21/2013   Thyroid function study abnormality 02/21/2013    PCP: Ann Held, DO  REFERRING PROVIDER: Jessy Oto, MD  REFERRING DIAG: M54.2 (ICD-10-CM) - Cervicalgia M47.22 (ICD-10-CM) - Other spondylosis with radiculopathy, cervical region  THERAPY DIAG:  Cervicalgia  Cramp and spasm  Muscle weakness (generalized)  ONSET DATE: 06/26/21  SUBJECTIVE:                                                                                                                                                                                                         SUBJECTIVE STATEMENT: Pt did a lot of yard work this weekend so low back sore.   Did chest and neck stretches this morning.  Little tingling yesterday but none today.    PERTINENT HISTORY:  Migraines, R lumpectomy and radiation 2020/2021,osteoporosis, thyroidectomy (one lobe precancerous)  PAIN:  Are you having pain? Yes: NPRS scale: 1-2/10 Pain location: soreness R side of neck      PRECAUTIONS: Other: h/o R lumpectomy and radiation 2020/2021,osteoporosis  PATIENT GOALS get rid of the numbness and tingling  OBJECTIVE: (objective measurements taken at eval unless otherwise dated)   DIAGNOSTIC FINDINGS:  XR 11/03/21: mild degenerative disc disease C5-6 and C6-7. There is uncovertebral hypertrophy at the C5-6 level and mild C6-7. Some reversal of the lover one half of the cervical spine. MRI 10/15/21: 1. At C5-6 there is a tiny right paracentral disc protrusion. Bilateral uncovertebral degenerative changes. Moderate bilateral foraminal stenosis. 2. At C6-7 there is a mild broad-based disc bulge. Bilateral uncovertebral degenerative changes. Mild bilateral foraminal stenosis. 12/02/2021 IMPRESSION: This MRI of the thoracic spine with and without contrast shows the following: 1.   The spinal cord appears normal before and after contrast. 2.   Mild degenerative changes in the lower cervical spine not leading to spinal stenosis or nerve root compression.  PATIENT SURVEYS:  FOTO FS = 70 (predicted 71)  SENSATION: Decreased sensation in ant  neck due  to thryroid surgery Some decreased sensation in right scapula and b/w spine and scapula  POSTURE:  Rounded shoulders, else WNL  PALPATION: Increased muscle tension in B UT and right cervical paraspinals and subocciptals Decreased R sided lateral glides and R UPA mobility in C2/3, 3/4, C5/6  CERVICAL ROM:    ROM A/PROM (deg) 11/09/2021  Flexion full  Extension full  Right lateral flexion 29 / full  Left lateral flexion 13 / full but tight  Right rotation 72 Pain on R / full  Left rotation 54 Pull on L down to scapula / full   (Blank rows = not tested)  UE ROM: WNL     UE MMT: 5/5 UE and neck except deep neck flexors 5-/5   Mid traps R 5/5 L 4+/5   Low traps R 4+/5 L 4/5  CERVICAL SPECIAL TESTS:  Negative compression/distraction   TODAY'S TREATMENT:  12/12/2021 Therapeutic Exercise: to improve strength and mobility.  Demo, verbal and tactile cues throughout for technique. UBE L2 x 6 min (78f3b) Rows GTB 2 x 10  Bil shoulder extension GTB 2 x 10  Bil shoulder ER GTB 2 x 10 Bird dogs 2 x 10 bil  Child pose stretch between sets of bird dogs Manual Therapy: to decrease muscle spasm and pain and improve mobility PA/UPA mobs thoracic spine grade 3-4, PA mobs cervical spine in supine, NAGS into rotation, suboccipital release.   IASTM to bil rhomboids.  TPR R masseter.    12/09/2021 Therapeutic Exercise: to improve strength and mobility.  Demo, verbal and tactile cues throughout for technique.  UBE L2 x 6 min (32fb) Cat cows x 10 Thread the needle x 10 bil  Cobra press-ups x 10  Thoracic self mobs with foam roller Manual Therapy: to decrease muscle spasm and pain and improve mobility STM/TPR to R masseter STM/TPR to bil suboccipitals STM/TPR bil UT/levator scapulae PA mobs thoracic spine skilled palpation and monitoring during dry needling. Trigger Point Dry-Needling  Treatment instructions: Expect mild to moderate muscle soreness. S/S of pneumothorax if dry  needled over a lung field, and to seek immediate medical attention should they occur. Patient verbalized understanding of these instructions and education.  Patient Consent Given: Yes Education handout provided: Previously provided Muscles treated: R levator scapulae Electrical stimulation performed: No Parameters: N/A Treatment response/outcome: Twitch Response Elicited and Palpable Increase in Muscle Length    12/05/2021 Therapeutic Exercise: to improve strength and mobility.  Demo, verbal and tactile cues throughout for technique. UBE L1 x 6 min ([id number]) Thoracic self mobs on foam roller Pec stretch on foam roller Arm flexion on foam roller alternating arms.  Rows RTB 2 x 10  Shoulder extensions RTB 2 x 10  Bil ER RTB 2 x 10  Manual Therapy: to decrease muscle spasm and pain and improve mobility.  PA mobs cervical spine, NAGS into rotation, PA mobs thoracic spine, TPR R UT, pec, lats    PATIENT EDUCATION:  Education details: continue HEP Person educated: Patient Education method: ExpCustomer service managerucation comprehension: verbalized understanding and returned demonstration   HOME EXERCISE PROGRAM: Access Code: [email or web address]   ASSESSMENT:  CLINICAL IMPRESSION: PatSatori Krabill[place] less tingling, also noted less tightness in R jaw this weekend.  Reviewed postural strengthening exercises today as has not been performing and progressed core strengthening, followed by manual therapy to thoracic and cervical spine.  She would benefit from continued skilled therapy.    OBJECTIVE IMPAIRMENTS decreased ROM, decreased strength, hypomobility, increased muscle spasms, impaired  flexibility, impaired UE functional use, and postural dysfunction.   ACTIVITY LIMITATIONS  OH activity .   PERSONAL FACTORS Time since onset of injury/illness/exacerbation and 3+ comorbidities: OP, migraines, h/o thyroidectomy and radiation for breast CA  are also affecting patient's functional  outcome.    GOALS: Goals reviewed with patient? Yes  SHORT TERM GOALS: Target date: 11/23/2021   Patient will be independent with initial HEP.  Baseline:  Goal status: MET   LONG TERM GOALS: Target date: 12/21/2021 (Remove Blue Hyperlink)  Patient will be independent with advanced/ongoing HEP to improve outcomes and carryover.  Baseline:  Goal status: IN PROGRESS  2.  Patient will report 75% improvement in neck/back tingling to improve QOL.  Baseline:  Goal status: IN PROGRESS  3.  Patient will demonstrate full cervical rotation for safety with driving and left lateral SB within 5 deg of R indicating improved flexibility.  Baseline:  Goal status: IN PROGRESS  4.  Patient will demonstrate improved posture to decrease muscle imbalance. Baseline:  Goal status: IN PROGRESS  5.  Patient will be able to work [facility] with 75% less difficulty.    Baseline:  Goal status: IN PROGRESS    PLAN: PT FREQUENCY: 2x/week  PT DURATION: 6 weeks  PLANNED INTERVENTIONS: Therapeutic exercises, Therapeutic activity, Neuromuscular re-education, Patient/Family education, Dry Needling, Electrical stimulation, Spinal mobilization, Cryotherapy, Moist heat, Ultrasound, and Manual therapy  PLAN FOR NEXT SESSION:  Progress HEP as needed, DN/MT, neck flexibility, upper back/postural strength, cervical stab   Rennie Natter, PT, DPT  12/12/2021, 11:53 AM

## 2021-12-15 ENCOUNTER — Other Ambulatory Visit (INDEPENDENT_AMBULATORY_CARE_PROVIDER_SITE_OTHER): Payer: Medicare Other

## 2021-12-15 ENCOUNTER — Encounter: Payer: Self-pay | Admitting: Physical Therapy

## 2021-12-15 ENCOUNTER — Ambulatory Visit: Payer: Medicare Other | Admitting: Physical Therapy

## 2021-12-15 DIAGNOSIS — M542 Cervicalgia: Secondary | ICD-10-CM

## 2021-12-15 DIAGNOSIS — R252 Cramp and spasm: Secondary | ICD-10-CM | POA: Diagnosis not present

## 2021-12-15 DIAGNOSIS — M6281 Muscle weakness (generalized): Secondary | ICD-10-CM

## 2021-12-15 DIAGNOSIS — R748 Abnormal levels of other serum enzymes: Secondary | ICD-10-CM

## 2021-12-15 LAB — COMPREHENSIVE METABOLIC PANEL
ALT: 56 U/L — ABNORMAL HIGH (ref 0–35)
AST: 36 U/L (ref 0–37)
Albumin: 4.6 g/dL (ref 3.5–5.2)
Alkaline Phosphatase: 91 U/L (ref 39–117)
BUN: 13 mg/dL (ref 6–23)
CO2: 30 mEq/L (ref 19–32)
Calcium: 9.9 mg/dL (ref 8.4–10.5)
Chloride: 104 mEq/L (ref 96–112)
Creatinine, Ser: 0.77 mg/dL (ref 0.40–1.20)
GFR: 79.32 mL/min (ref >60.00)
Glucose, Bld: 93 mg/dL (ref 70–99)
Potassium: 5.6 mEq/L — ABNORMAL HIGH (ref 3.5–5.1)
Sodium: 140 mEq/L (ref 135–145)
Total Bilirubin: 0.8 mg/dL (ref 0.2–1.2)
Total Protein: 6.7 g/dL (ref 6.0–8.3)

## 2021-12-15 NOTE — Therapy (Signed)
OUTPATIENT PHYSICAL THERAPY TREATMENT NOTE   Patient Name: Kimberly Miles MRN: [id number] DOB:[birth year], 66 y.o., female Today's Date: 12/15/2021   PT End of Session - 12/15/21 1109     Visit Number 8    Date for PT Re-Evaluation 12/21/21    Authorization Type MCR    Progress Note Due on Visit 10    PT Start Time 1106    PT Stop Time 1152    PT Time Calculation (min) 46 min    Activity Tolerance Patient tolerated treatment well    Behavior During Therapy [facility] for tasks assessed/performed            Rationale for Evaluation and Treatment:  Rehabilitation    Past Medical History:  Diagnosis Date   Anxiety    Arthritis    Cancer ([place]) 11/2018   right breast cancer   Common migraine with intractable migraine 75/03/2584   Complication of anesthesia    Diverticulosis    Elevated LFTs    monitored by pcp per pt --  unknown etiology   Family history of premature CAD 09/01/2013   Family history of prostate cancer    Family history of uterine cancer    History of basal cell carcinoma excision    2004   &  2014   History of colitis    History of palpitations    History of thyroid nodule    s/p  right thyroid lobectomy 03-08-2011  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   IBS (irritable bowel syndrome)    Migraine    MTHFR gene mutation    Personal history of radiation therapy    PMB (postmenopausal bleeding)    PONV (postoperative nausea and vomiting)    Post-surgical hypothyroidism    Past Surgical History:  Procedure Laterality Date   APPENDECTOMY  as child   BREAST BIOPSY Left 1979   benign   BREAST EXCISIONAL BIOPSY Left    BREAST LUMPECTOMY Right 01/2019   BREAST LUMPECTOMY WITH RADIOACTIVE SEED AND SENTINEL LYMPH NODE BIOPSY Right 02/04/2019   Procedure: RIGHT BREAST LUMPECTOMY WITH RADIOACTIVE SEED AND RIGHT AXILLARY DEEP SENTINEL LYMPH NODE BIOPSY WITH BLUE DYE INJECTION;  Surgeon: Fanny Skates, MD;  Location: [place];   Service: General;  Laterality: Right;   BREAST REDUCTION SURGERY Bilateral 02/11/2019   Procedure: RIGHT BREAST ONCOPLASTIC RECONSTRUCTION, LEFT BREAST REDUCTION;  Surgeon: Irene Limbo, MD;  Location: [place];  Service: Plastics;  Laterality: Bilateral;   CARDIOVASCULAR STRESS TEST  09-16-2013   normal nuclear study w/ no ischemia/  normal LV function and wall motion , ef 84%   CESAREAN SECTION  1977 and 1978   Bilateral Tubal Ligation w/ last c/s   COLPOSCOPY  05/2009   CIN 1   HYSTEROSCOPY WITH D & C N/A 12/23/2015   Procedure: DILATATION AND CURETTAGE /HYSTEROSCOPY ;  Surgeon: Megan Salon, MD;  Location: [facility];  Service: Gynecology;  Laterality: N/A;   NEGATIVE SLEEP STUDY  2014  per pt   OOPHORECTOMY Right 1985   ruptured cyst    REDUCTION MAMMAPLASTY Bilateral 01/2019   THYROID LOBECTOMY Right 03-08-2011   TRANSTHORACIC ECHOCARDIOGRAM  05-09-2011   ef 64%/  mild MR and TR   TUBAL LIGATION     Patient Active Problem List   Diagnosis Date Noted   Paresthesia 11/24/2021   Hyperreflexia 11/24/2021   Lower abdominal pain 04/26/2021   Acute cough 04/26/2021   Vitamin D deficiency 04/26/2021  Abdominal bloating 04/26/2021   Age-related osteoporosis without current pathological fracture 04/09/2020   Genetic testing 08/07/2019   Family history of prostate cancer    Family history of uterine cancer    MTHFR gene mutation 01/16/2019   Epigastric pain 01/15/2019   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, estrogen receptor positive ([place]) 12/24/2018   Hypothyroid 11/11/2018   Memory change 11/13/2016   Common migraine with intractable migraine 04/06/2016   H/O partial thyroidectomy 01/14/2014   Family history of premature CAD 09/01/2013   History of migraine headaches 02/21/2013   Hyperlipidemia 02/21/2013   Thyroid function study abnormality 02/21/2013    PCP: Ann Held, DO  REFERRING PROVIDER: Jessy Oto, MD  REFERRING DIAG: M54.2 (ICD-10-CM) - Cervicalgia M47.22 (ICD-10-CM) - Other spondylosis with radiculopathy, cervical region  THERAPY DIAG:  Cervicalgia  Cramp and spasm  Muscle weakness (generalized)  ONSET DATE: 06/26/21  SUBJECTIVE:                                                                                                                                                                                                         SUBJECTIVE STATEMENT: Pt reports more tingling in back, 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  Not painful just annoying.  Went to [employer].    PERTINENT HISTORY:  Migraines, R lumpectomy and radiation 2020/2021,osteoporosis, thyroidectomy (one lobe precancerous)  PAIN:  Are you having pain? Yes: NPRS scale: 1-2/10 Pain location: soreness R side of neck      PRECAUTIONS: Other: h/o R lumpectomy and radiation 2020/2021,osteoporosis  PATIENT GOALS get rid of the numbness and tingling  OBJECTIVE: (objective measurements taken at eval unless otherwise dated)   DIAGNOSTIC FINDINGS:  XR 11/03/21: mild degenerative disc disease C5-6 and C6-7. There is uncovertebral hypertrophy at the C5-6 level and mild C6-7. Some reversal of the lover one half of the cervical spine. MRI 10/15/21: 1. At C5-6 there is a tiny right paracentral disc protrusion. Bilateral uncovertebral degenerative changes. Moderate bilateral foraminal stenosis. 2. At C6-7 there is a mild broad-based disc bulge. Bilateral uncovertebral degenerative changes. Mild bilateral foraminal stenosis. 12/02/2021 IMPRESSION: This MRI of the thoracic spine with and without contrast shows the following: 1.   The spinal cord appears normal before and after contrast. 2.   Mild degenerative changes in the lower cervical spine not leading to spinal stenosis or nerve root compression.  PATIENT SURVEYS:  FOTO FS = 70 (predicted 71)  SENSATION: Decreased sensation in ant neck  due to thryroid surgery Some decreased  sensation in right scapula and b/w spine and scapula  POSTURE:  Rounded shoulders, else WNL  PALPATION: Increased muscle tension in B UT and right cervical paraspinals and subocciptals Decreased R sided lateral glides and R UPA mobility in C2/3, 3/4, C5/6  CERVICAL ROM:    ROM A/PROM (deg) 11/09/2021  Flexion full  Extension full  Right lateral flexion 29 / full  Left lateral flexion 13 / full but tight  Right rotation 72 Pain on R / full  Left rotation 54 Pull on L down to scapula / full   (Blank rows = not tested)  UE ROM: WNL     UE MMT: 5/5 UE and neck except deep neck flexors 5-/5   Mid traps R 5/5 L 4+/5   Low traps R 4+/5 L 4/5  CERVICAL SPECIAL TESTS:  Negative compression/distraction   TODAY'S TREATMENT:  12/15/2021 Therapeutic Exercise: to improve strength and mobility.  Demo, verbal and tactile cues throughout for technique. UBE L2 x 6 min (58f3b) Rows GTB 2 x 10  tactile cues to decrease UT engagement Bil shoulder extension GTB 2 x 10  tactile cues to decrease UT engagement Thoracic mobs with foam roller.  On foam roller - arm flexion x 20 bil, angels x 20 Manual Therapy: to decrease muscle spasm and pain and improve mobility PA mobs thoracic spine, TPR to rhomboids, skilled palpation and monitoring during dry needling. Trigger Point Dry-Needling  Treatment instructions: Expect mild to moderate muscle soreness. S/S of pneumothorax if dry needled over a lung field, and to seek immediate medical attention should they occur. Patient verbalized understanding of these instructions and education.  Patient Consent Given: Yes Education handout provided: Previously provided Muscles treated: bil UT, levator scapulae Electrical stimulation performed: No Parameters: N/A Treatment response/outcome: Twitch Response Elicited and Palpable Increase in Muscle Length   12/12/2021 Therapeutic Exercise: to improve strength and mobility.   Demo, verbal and tactile cues throughout for technique. UBE L2 x 6 min (362fb) Rows GTB 2 x 10  Bil shoulder extension GTB 2 x 10  Bil shoulder ER GTB 2 x 10 Bird dogs 2 x 10 bil  Child pose stretch between sets of bird dogs Manual Therapy: to decrease muscle spasm and pain and improve mobility PA/UPA mobs thoracic spine grade 3-4, PA mobs cervical spine in supine, NAGS into rotation, suboccipital release.   IASTM to bil rhomboids.  TPR R masseter.   12/09/2021 Therapeutic Exercise: to improve strength and mobility.  Demo, verbal and tactile cues throughout for technique.  UBE L2 x 6 min ([id number]) Cat cows x 10 Thread the needle x 10 bil  Cobra press-ups x 10  Thoracic self mobs with foam roller Manual Therapy: to decrease muscle spasm and pain and improve mobility STM/TPR to R masseter STM/TPR to bil suboccipitals STM/TPR bil UT/levator scapulae PA mobs thoracic spine skilled palpation and monitoring during dry needling. Trigger Point Dry-Needling  Treatment instructions: Expect mild to moderate muscle soreness. S/S of pneumothorax if dry needled over a lung field, and to seek immediate medical attention should they occur. Patient verbalized understanding of these instructions and education.  Patient Consent Given: Yes Education handout provided: Previously provided Muscles treated: R levator scapulae Electrical stimulation performed: No Parameters: N/A Treatment response/outcome: Twitch Response Elicited and Palpable Increase in Muscle Length      PATIENT EDUCATION:  Education details: continue HEP Person educated: Patient Education method: ExpCustomer service managerucation comprehension: verbalized understanding and returned demonstration   HOME EXERCISE PROGRAM: Access  Code: [email or web address]   ASSESSMENT:  CLINICAL IMPRESSION: Kimberly Miles reported more soreness in UT and tingling in thoracic spine following increased resistance on Monday.  Reviewed exercises  today, needed tactile cues to keep UT relaxed while engaging middle trap, followed by dynamic exercises to mobilize thoracic spine.  MT and DN to thoracic spine, after which reported decreased spasm and no tingling.  She would benefit from continued skilled therapy.    OBJECTIVE IMPAIRMENTS decreased ROM, decreased strength, hypomobility, increased muscle spasms, impaired flexibility, impaired UE functional use, and postural dysfunction.   ACTIVITY LIMITATIONS  OH activity .   PERSONAL FACTORS Time since onset of injury/illness/exacerbation and 3+ comorbidities: OP, migraines, h/o thyroidectomy and radiation for breast CA  are also affecting patient's functional outcome.    GOALS: Goals reviewed with patient? Yes  SHORT TERM GOALS: Target date: 11/23/2021   Patient will be independent with initial HEP.  Baseline:  Goal status: MET   LONG TERM GOALS: Target date: 12/21/2021  Patient will be independent with advanced/ongoing HEP to improve outcomes and carryover.  Baseline:  Goal status: IN PROGRESS  2.  Patient will report 75% improvement in neck/back tingling to improve QOL.  Baseline:  Goal status: IN PROGRESS  3.  Patient will demonstrate full cervical rotation for safety with driving and left lateral SB within 5 deg of R indicating improved flexibility.  Baseline:  Goal status: IN PROGRESS  4.  Patient will demonstrate improved posture to decrease muscle imbalance. Baseline:  Goal status: IN PROGRESS  5.  Patient will be able to work [facility] with 75% less difficulty.    Baseline:  Goal status: IN PROGRESS    PLAN: PT FREQUENCY: 2x/week  PT DURATION: 6 weeks  PLANNED INTERVENTIONS: Therapeutic exercises, Therapeutic activity, Neuromuscular re-education, Patient/Family education, Dry Needling, Electrical stimulation, Spinal mobilization, Cryotherapy, Moist heat, Ultrasound, and Manual therapy  PLAN FOR NEXT SESSION:  Progress HEP as needed, DN/MT, neck flexibility, upper  back/postural strength, cervical stab   Rennie Natter, PT, DPT  12/15/2021, 11:58 AM

## 2021-12-19 ENCOUNTER — Ambulatory Visit: Payer: Medicare Other | Admitting: Physical Therapy

## 2021-12-22 ENCOUNTER — Ambulatory Visit: Payer: Medicare Other | Admitting: Physical Therapy

## 2021-12-29 ENCOUNTER — Ambulatory Visit (INDEPENDENT_AMBULATORY_CARE_PROVIDER_SITE_OTHER): Payer: Medicare Other | Admitting: Specialist

## 2021-12-29 ENCOUNTER — Encounter: Payer: Self-pay | Admitting: Specialist

## 2021-12-29 VITALS — BP 115/71 | HR 64 | Ht <= 58 in | Wt 115.0 lb

## 2021-12-29 DIAGNOSIS — M4722 Other spondylosis with radiculopathy, cervical region: Secondary | ICD-10-CM

## 2021-12-29 DIAGNOSIS — N281 Cyst of kidney, acquired: Secondary | ICD-10-CM

## 2021-12-29 DIAGNOSIS — R93422 Abnormal radiologic findings on diagnostic imaging of left kidney: Secondary | ICD-10-CM

## 2021-12-29 NOTE — Patient Instructions (Addendum)
Avoid overhead lifting and overhead use of the arms. Do not lift greater than 5-10 lbs. Adjust head rest in vehicle to prevent hyperextension if rear ended. Take extra precautions to avoid falling. Cervical traction 2-3 times for 10-15 minutes. U/S of kidneys  Cystic abdominal mass.  It was present on the 2017 lumbar MRI but has increased in size.  If clinically indicated, this might be better evaluated on CT or ultrasound study

## 2021-12-29 NOTE — Progress Notes (Signed)
Office Visit Note   Patient: Kimberly Miles           Date of Birth: [birth year]           MRN: [id number] Visit Date: 12/29/2021              Requested by: [address] [place],  [place] [address] PCP: Nicholas Lose, MD   Assessment & Plan: Visit Diagnoses:  1. Other spondylosis with radiculopathy, cervical region   2. Complex renal cyst     Plan: Avoid overhead lifting and overhead use of the arms. Do not lift greater than 5-10 lbs. Adjust head rest in vehicle to prevent hyperextension if rear ended. Take extra precautions to avoid falling. Cervical traction 2-3 times for 10-15 minutes. U/S of kidneys  Cystic abdominal mass.  It was present on the 2017 lumbar MRI but has increased in size.  If clinically indicated, this might be better evaluated on CT or ultrasound study  Follow-Up Instructions: Return in about 3 weeks (around 01/19/2022).   Orders:  No orders of the defined types were placed in this encounter.  No orders of the defined types were placed in this encounter.     Procedures: No procedures performed   Clinical Data: Findings:  Narrative & Impression CLINICAL DATA:  Cervical pain. Tingling and numbness in the neck, face and between the shoulders   EXAM: MRI CERVICAL SPINE WITHOUT CONTRAST   TECHNIQUE: Multiplanar, multisequence MR imaging of the cervical spine was performed. No intravenous contrast was administered.   COMPARISON:  Cervical spine x-ray 09/29/2021   FINDINGS: Alignment: Physiologic.   Vertebrae: No acute fracture, evidence of discitis, or aggressive bone lesion.   Cord: Normal signal and morphology.   Posterior Fossa, vertebral arteries, paraspinal tissues: Posterior fossa demonstrates no focal abnormality. Vertebral artery flow voids are maintained. Paraspinal soft tissues are unremarkable.   Disc levels:   Discs: Mild degenerative disease with disc height loss at C5-6  and C6-7.   C2-3: No significant disc bulge. No neural foraminal stenosis. No central canal stenosis.   C3-4: No significant disc bulge. No neural foraminal stenosis. No central canal stenosis.   C4-5: Mild broad-based disc bulge. No foraminal or central canal stenosis.   C5-6: Tiny right paracentral disc protrusion. Bilateral uncovertebral degenerative changes. Moderate bilateral foraminal stenosis. No spinal stenosis.   C6-7: Mild broad-based disc bulge. Bilateral uncovertebral degenerative changes. Mild bilateral foraminal stenosis.   C7-T1: No significant disc bulge. No neural foraminal stenosis. No central canal stenosis.   IMPRESSION: 1. At C5-6 there is a tiny right paracentral disc protrusion. Bilateral uncovertebral degenerative changes. Moderate bilateral foraminal stenosis. 2. At C6-7 there is a mild broad-based disc bulge. Bilateral uncovertebral degenerative changes. Mild bilateral foraminal stenosis. 3.  No acute osseous injury of the cervical spine.     Electronically Signed   By: Kathreen Devoid M.D.   On: 10/15/2021 14:57   STUDY DATE: 12/02/2021 PATIENT NAME: Kimberly Miles DOB: Aug 25, 1955 MRN: [id number]   EXAM: MRI of the thoracic spine with and without contrast   ORDERING CLINICIAN: Marcial Pacas MD, PhD CLINICAL HISTORY: 66 year old woman with paresthesias and hyperreflexia COMPARISON FILMS: MRI of the lumbar spine 02/26/2016   TECHNIQUE: MRI of the thoracic spine was obtained utilizing 3 mm sagittal slices from the posterior fossa from C7 to L1 level with T1, T2 and inversion recovery views. In addition 4 mm axial slices from Y6T0 to P54S5 level  were included with T2 and gradient echo views.  After the infusion contrast, additional T1-weighted images were performed. CONTRAST: 9 mL MultiH[facility] IMAGING SITE: [facility], [place], [place], [place]   FINDINGS: :  On scout images, the spine is imaged from above the cervicomedullary  junction to the lower thoracic spine.  Anterior to the right kidney there is a 3.5 x 6.0+ centimeter (minor x major axis) oval cystic focus that is hyperintense on T2 weighted images and hypointense on T1-weighted images.  It does not enhance.  It is also seen on the 02/26/2016 MRI of the lumbar spine, but smaller, measuring about 2.1 x 4.2 cm at that time.    The dedicated thoracic spine images run C7-L2.  Thespinal cord is of normal caliber and signal.   The vertebral bodies are normally aligned.  Hemangiomas are noted within T6 and T10..  The discs and interspaces were further evaluated on axial views from C7 to L1.  Disc bulging is noted at T10-T11, T11-T12, T12-L1 and L1-L2.  Additionally, Schmorl's nodes are noted at T12-L1.  There does not appear to be spinal stenosis or nerve root compression at these levels..   After the infusion of contrast, a normal enhancement pattern was observed.     IMPRESSION: This MRI of the thoracic spine with and without contrast shows the following: 1.   The spinal cord appears normal before and after contrast. 2.   Mild degenerative changes in the lower cervical spine not leading to spinal stenosis or nerve root compression. 3.   Cystic abdominal mass.  It was present on the 2017 lumbar MRI but has increased in size.  If clinically indicated, this might be better evaluated on CT or ultrasound study 4.   Normal enhancement pattern.     INTERPRETING PHYSICIAN:  Richard A. Felecia Shelling, MD, PhD, FAAN Certified in  Neuroimaging by [employer]     Subjective: Chief Complaint  Patient presents with   Neck - Follow-up    66 year old female with history of estrogen positive breast carcinoma. Had lumpectomy and sentinel LN removal. She is on hormone treatment and is to undergo radiation treatment. She is concern about cyst seen of previous CT and MRI scans and requests that this be assessed. No bowel or bladder difficulties. She has been to PT and  this was of benefit is relieving some pain but she reports she still experiences neck pain and though pain is better it is not gone. She is standing and walking with out difficulting, no dropping of items, no loss of balance. ANA, Sed rate and CRP are normal. No weight loss.  Pain at night but when she is able to get comfortable she is able to sleep.     Review of Systems  Constitutional: Negative.  Negative for activity change, appetite change, chills, diaphoresis, fatigue, fever and unexpected weight change.  HENT:  Positive for congestion, rhinorrhea and sneezing. Negative for dental problem, drooling, ear discharge, ear pain, facial swelling, hearing loss, mouth sores, nosebleeds, postnasal drip, sinus pressure, sinus pain, sore throat and tinnitus.   Eyes: Negative.   Respiratory: Negative.  Negative for apnea, cough, choking, chest tightness, shortness of breath, wheezing and stridor.   Cardiovascular: Negative.  Negative for chest pain, palpitations and leg swelling.  Gastrointestinal:  Negative for abdominal distention, abdominal pain, anal bleeding, blood in stool, constipation, diarrhea, nausea, rectal pain and vomiting.  Endocrine: Negative.   Genitourinary: Negative.   Musculoskeletal:  Positive for neck  pain and neck stiffness. Negative for arthralgias, back pain, gait problem and myalgias.  Skin: Negative.  Negative for color change, pallor, rash and wound.  Allergic/Immunologic: Negative.   Neurological:  Positive for weakness and numbness. Negative for dizziness, tremors, seizures, syncope, facial asymmetry, speech difficulty, light-headedness and headaches.  Hematological: Negative.  Negative for adenopathy. Does not bruise/bleed easily.  Psychiatric/Behavioral: Negative.  Negative for agitation, behavioral problems, confusion, decreased concentration, dysphoric mood, hallucinations, self-injury, sleep disturbance and suicidal ideas. The patient is not nervous/anxious and is not  hyperactive.      Objective: Vital Signs: BP 115/71 (BP Location: Left Arm, Patient Position: Sitting)   Pulse 64   Ht '4\' 10"'$  (1.473 m)   Wt 115 lb (52.2 kg)   LMP 08/25/2010   BMI 24.04 kg/m   Physical Exam Constitutional:      Appearance: She is well-developed.  HENT:     Head: Normocephalic and atraumatic.  Eyes:     Pupils: Pupils are equal, round, and reactive to light.  Pulmonary:     Effort: Pulmonary effort is normal.     Breath sounds: Normal breath sounds.  Abdominal:     General: Bowel sounds are normal.     Palpations: Abdomen is soft.  Musculoskeletal:     Cervical back: Normal range of motion and neck supple.     Lumbar back: Negative right straight leg raise test and negative left straight leg raise test.  Skin:    General: Skin is warm and dry.  Neurological:     Mental Status: She is alert and oriented to person, place, and time.  Psychiatric:        Behavior: Behavior normal.        Thought Content: Thought content normal.        Judgment: Judgment normal.     Back Exam   Tenderness  The patient is experiencing tenderness in the lumbar and cervical.  Range of Motion  Extension:  70 abnormal  Flexion:  70 abnormal  Lateral bend right:  70 abnormal  Lateral bend left:  70 abnormal  Rotation right:  70 abnormal  Rotation left:  70 abnormal   Muscle Strength  Right Quadriceps:  5/5  Left Quadriceps:  5/5  Right Hamstrings:  5/5  Left Hamstrings:  5/5   Tests  Straight leg raise right: negative Straight leg raise left: negative  Reflexes  Patellar:  2/4 Achilles:  2/4 Biceps:  2/4 Babinski's sign: normal   Other  Toe walk: normal Sensation: normal Gait: normal  Erythema: no back redness Scars: absent  Comments:  No hyperreflexia, no clonus, no spasticity. SLR negative, Hoffman's sign negative. Mild spurling sign.      Specialty Comments:  No specialty comments available.  Imaging: No results found.   PMFS  History: Patient Active Problem List   Diagnosis Date Noted   Paresthesia 11/24/2021   Hyperreflexia 11/24/2021   Lower abdominal pain 04/26/2021   Acute cough 04/26/2021   Vitamin D deficiency 04/26/2021   Abdominal bloating 04/26/2021   Age-related osteoporosis without current pathological fracture 04/09/2020   Genetic testing 08/07/2019   Family history of prostate cancer    Family history of uterine cancer    MTHFR gene mutation 01/16/2019   Epigastric pain 01/15/2019   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, estrogen receptor positive ([place]) 12/24/2018   Hypothyroid 11/11/2018   Memory change 11/13/2016   Common migraine with intractable migraine 04/06/2016   H/O partial thyroidectomy 01/14/2014  Family history of premature CAD 09/01/2013   History of migraine headaches 02/21/2013   Hyperlipidemia 02/21/2013   Thyroid function study abnormality 02/21/2013   Past Medical History:  Diagnosis Date   Anxiety    Arthritis    Cancer ([place]) 11/2018   right breast cancer   Common migraine with intractable migraine 42/59/5638   Complication of anesthesia    Diverticulosis    Elevated LFTs    monitored by pcp per pt --  unknown etiology   Family history of premature CAD 09/01/2013   Family history of prostate cancer    Family history of uterine cancer    History of basal cell carcinoma excision    2004   &  2014   History of colitis    History of palpitations    History of thyroid nodule    s/p  right thyroid lobectomy 03-08-2011  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   IBS (irritable bowel syndrome)    Migraine    MTHFR gene mutation    Personal history of radiation therapy    PMB (postmenopausal bleeding)    PONV (postoperative nausea and vomiting)    Post-surgical hypothyroidism     Family History  Problem Relation Age of Onset   Hypertension Maternal Grandmother    Uterine cancer Maternal Grandmother 65   Vascular Disease Maternal  Grandmother    Lung cancer Maternal Grandmother 75   Stroke Maternal Grandfather    Prostate cancer Paternal Grandfather 2   Migraines Paternal Grandmother    Lung cancer Mother 44   Hypertension Mother 90   Hyperlipidemia Mother 38   Ehlers-Danlos syndrome Mother    Kidney disease Mother    Fibromyalgia Mother    Other Father        bypass surgery times 2   Migraines Father    Osteoporosis Father    Prostate cancer Father    Kidney disease Maternal Aunt     Past Surgical History:  Procedure Laterality Date   APPENDECTOMY  as child   BREAST BIOPSY Left 1979   benign   BREAST EXCISIONAL BIOPSY Left    BREAST LUMPECTOMY Right 01/2019   BREAST LUMPECTOMY WITH RADIOACTIVE SEED AND SENTINEL LYMPH NODE BIOPSY Right 02/04/2019   Procedure: RIGHT BREAST LUMPECTOMY WITH RADIOACTIVE SEED AND RIGHT AXILLARY DEEP SENTINEL LYMPH NODE BIOPSY WITH BLUE DYE INJECTION;  Surgeon: Fanny Skates, MD;  Location: [facility];  Service: General;  Laterality: Right;   BREAST REDUCTION SURGERY Bilateral 02/11/2019   Procedure: RIGHT BREAST ONCOPLASTIC RECONSTRUCTION, LEFT BREAST REDUCTION;  Surgeon: Irene Limbo, MD;  Location: [facility];  Service: Plastics;  Laterality: Bilateral;   CARDIOVASCULAR STRESS TEST  09-16-2013   normal nuclear study w/ no ischemia/  normal LV function and wall motion , ef 84%   CESAREAN SECTION  1977 and 1978   Bilateral Tubal Ligation w/ last c/s   COLPOSCOPY  05/2009   CIN 1   HYSTEROSCOPY WITH D & C N/A 12/23/2015   Procedure: DILATATION AND CURETTAGE /HYSTEROSCOPY ;  Surgeon: Megan Salon, MD;  Location: [facility];  Service: Gynecology;  Laterality: N/A;   NEGATIVE SLEEP STUDY  2014  per pt   OOPHORECTOMY Right 1985   ruptured cyst    REDUCTION MAMMAPLASTY Bilateral 01/2019   THYROID LOBECTOMY Right 03-08-2011   TRANSTHORACIC ECHOCARDIOGRAM  05-09-2011   ef 64%/  mild MR and TR   TUBAL LIGATION     Social  History  Occupational History   Occupation: Retired  Tobacco Use   Smoking status: Never   Smokeless tobacco: Never  Vaping Use   Vaping Use: Never used  Substance and Sexual Activity   Alcohol use: Not Currently    Alcohol/week: 0.0 standard drinks of alcohol   Drug use: No   Sexual activity: Yes    Partners: Male    Birth control/protection: Post-menopausal    Comment: BTL done w/ last C/S

## 2022-01-04 ENCOUNTER — Ambulatory Visit: Payer: Medicare Other | Attending: Specialist

## 2022-01-04 DIAGNOSIS — M6281 Muscle weakness (generalized): Secondary | ICD-10-CM | POA: Diagnosis not present

## 2022-01-04 DIAGNOSIS — M542 Cervicalgia: Secondary | ICD-10-CM | POA: Diagnosis not present

## 2022-01-04 DIAGNOSIS — R252 Cramp and spasm: Secondary | ICD-10-CM | POA: Diagnosis not present

## 2022-01-04 NOTE — Addendum Note (Signed)
Addended by: Rennie Natter on: 01/04/2022 02:39 PM   Modules accepted: Orders

## 2022-01-04 NOTE — Therapy (Addendum)
OUTPATIENT PHYSICAL THERAPY TREATMENT NOTE   Patient Name: Kimberly Miles MRN: [id number] DOB:14-Jan-1956, 66 y.o., female Today's Date: 01/04/2022  Progress Note/Recert  Reporting Period 11/09/21 to 12/21/21  See note below for Objective Data and Assessment of Progress/Goals.  Patient is making good progress but would continue to benefit from PT for an additional 1-2x per week for 6 weeks in order to reduce pain, improve ROM, improve postural alignment, and strength to allow for better performance of ADLs.   Rennie Natter, PT, DPT 2:35 PM 01/04/2022   PT End of Session - 01/04/22 0926     Visit Number 9    Date for PT Re-Evaluation 02/15/22    Authorization Type MCR    Progress Note Due on Visit 75    PT Start Time 0845    PT Stop Time 0931    PT Time Calculation (min) 46 min    Activity Tolerance Patient tolerated treatment well    Behavior During Therapy [facility] for tasks assessed/performed               Rationale for Evaluation and Treatment:  Rehabilitation    Past Medical History:  Diagnosis Date   Anxiety    Arthritis    Cancer ([place]) 11/2018   right breast cancer   Common migraine with intractable migraine 13/01/6577   Complication of anesthesia    Diverticulosis    Elevated LFTs    monitored by pcp per pt --  unknown etiology   Family history of premature CAD 09/01/2013   Family history of prostate cancer    Family history of uterine cancer    History of basal cell carcinoma excision    2004   &  2014   History of colitis    History of palpitations    History of thyroid nodule    s/p  right thyroid lobectomy 03-08-2011  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   IBS (irritable bowel syndrome)    Migraine    MTHFR gene mutation    Personal history of radiation therapy    PMB (postmenopausal bleeding)    PONV (postoperative nausea and vomiting)    Post-surgical hypothyroidism    Past Surgical History:  Procedure Laterality Date    APPENDECTOMY  as child   BREAST BIOPSY Left 1979   benign   BREAST EXCISIONAL BIOPSY Left    BREAST LUMPECTOMY Right 01/2019   BREAST LUMPECTOMY WITH RADIOACTIVE SEED AND SENTINEL LYMPH NODE BIOPSY Right 02/04/2019   Procedure: RIGHT BREAST LUMPECTOMY WITH RADIOACTIVE SEED AND RIGHT AXILLARY DEEP SENTINEL LYMPH NODE BIOPSY WITH BLUE DYE INJECTION;  Surgeon: Fanny Skates, MD;  Location: [place];  Service: General;  Laterality: Right;   BREAST REDUCTION SURGERY Bilateral 02/11/2019   Procedure: RIGHT BREAST ONCOPLASTIC RECONSTRUCTION, LEFT BREAST REDUCTION;  Surgeon: Irene Limbo, MD;  Location: [place];  Service: Plastics;  Laterality: Bilateral;   CARDIOVASCULAR STRESS TEST  09-16-2013   normal nuclear study w/ no ischemia/  normal LV function and wall motion , ef 84%   CESAREAN SECTION  1977 and 1978   Bilateral Tubal Ligation w/ last c/s   COLPOSCOPY  05/2009   CIN 1   HYSTEROSCOPY WITH D & C N/A 12/23/2015   Procedure: DILATATION AND CURETTAGE /HYSTEROSCOPY ;  Surgeon: Megan Salon, MD;  Location: [facility];  Service: Gynecology;  Laterality: N/A;   NEGATIVE SLEEP STUDY  2014  per pt   [place]  ruptured cyst    REDUCTION MAMMAPLASTY Bilateral 01/2019   THYROID LOBECTOMY Right 03-08-2011   TRANSTHORACIC ECHOCARDIOGRAM  05-09-2011   ef 64%/  mild MR and TR   TUBAL LIGATION     Patient Active Problem List   Diagnosis Date Noted   Paresthesia 11/24/2021   Hyperreflexia 11/24/2021   Lower abdominal pain 04/26/2021   Acute cough 04/26/2021   Vitamin D deficiency 04/26/2021   Abdominal bloating 04/26/2021   Age-related osteoporosis without current pathological fracture 04/09/2020   Genetic testing 08/07/2019   Family history of prostate cancer    Family history of uterine cancer    MTHFR gene mutation 01/16/2019   Epigastric pain 01/15/2019   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,  estrogen receptor positive ([place]) 12/24/2018   Hypothyroid 11/11/2018   Memory change 11/13/2016   Common migraine with intractable migraine 04/06/2016   H/O partial thyroidectomy 01/14/2014   Family history of premature CAD 09/01/2013   History of migraine headaches 02/21/2013   Hyperlipidemia 02/21/2013   Thyroid function study abnormality 02/21/2013    PCP: Ann Held, DO  REFERRING PROVIDER: Jessy Oto, MD  REFERRING DIAG: M54.2 (ICD-10-CM) - Cervicalgia M47.22 (ICD-10-CM) - Other spondylosis with radiculopathy, cervical region  THERAPY DIAG:  Cervicalgia  Cramp and spasm  Muscle weakness (generalized)  ONSET DATE: 06/26/21  SUBJECTIVE:                                                                                                                                                                                                         SUBJECTIVE STATEMENT: Pt reports tingling along thoracic area just about everyday but for short periods. Reports having migraine for 3 days straight but ended today.  PERTINENT HISTORY:  Migraines, R lumpectomy and radiation 2020/2021,osteoporosis, thyroidectomy (one lobe precancerous)  PAIN:  Are you having pain? Yes: NPRS scale: 1-2/10 Pain location: soreness R side of neck      PRECAUTIONS: Other: h/o R lumpectomy and radiation 2020/2021,osteoporosis  PATIENT GOALS get rid of the numbness and tingling  OBJECTIVE: (objective measurements taken at eval unless otherwise dated)   DIAGNOSTIC FINDINGS:  XR 11/03/21: mild degenerative disc disease C5-6 and C6-7. There is uncovertebral hypertrophy at the C5-6 level and mild C6-7. Some reversal of the lover one half of the cervical spine. MRI 10/15/21: 1. At C5-6 there is a tiny right paracentral disc protrusion. Bilateral uncovertebral degenerative changes. Moderate bilateral foraminal stenosis. 2. At C6-7 there is a mild broad-based disc bulge. Bilateral uncovertebral  degenerative changes. Mild bilateral foraminal  stenosis. 12/02/2021 IMPRESSION: This MRI of the thoracic spine with and without contrast shows the following: 1.   The spinal cord appears normal before and after contrast. 2.   Mild degenerative changes in the lower cervical spine not leading to spinal stenosis or nerve root compression.  PATIENT SURVEYS:  FOTO FS = 70 (predicted 71)  SENSATION: Decreased sensation in ant neck due to thryroid surgery Some decreased sensation in right scapula and b/w spine and scapula  POSTURE:  Rounded shoulders, else WNL  PALPATION: Increased muscle tension in B UT and right cervical paraspinals and subocciptals Decreased R sided lateral glides and R UPA mobility in C2/3, 3/4, C5/6  CERVICAL ROM:    ROM A/PROM (deg) 11/09/2021 AROM 01/04/22  Flexion full   Extension full   Right lateral flexion 29 / full 31 deg  Left lateral flexion 13 / full but tight 29 deg  Right rotation 72 Pain on R / full 60 deg  Left rotation 54 Pull on L down to scapula / full 63 deg   (Blank rows = not tested)  UE ROM: WNL     UE MMT: 5/5 UE and neck except deep neck flexors 5-/5   Mid traps R 5/5 L 4+/5   Low traps R 4+/5 L 4/5  CERVICAL SPECIAL TESTS:  Negative compression/distraction   TODAY'S TREATMENT:  01/04/22 Therapeutic Exercise: Nustep L5x75mn Assessed LTGs and discussed progress with patient Standing row GTB x 10 Standing extension GTB x 10 Standing ER with GTB x 10 Cervical extension with pillowcase for stabilization 10x3"  12/15/2021 Therapeutic Exercise: to improve strength and mobility.  Demo, verbal and tactile cues throughout for technique. UBE L2 x 6 min (380fb) Rows GTB 2 x 10  tactile cues to decrease UT engagement Bil shoulder extension GTB 2 x 10  tactile cues to decrease UT engagement Thoracic mobs with foam roller.  On foam roller - arm flexion x 20 bil, angels x 20 Manual Therapy: to decrease muscle spasm and pain and improve  mobility PA mobs thoracic spine, TPR to rhomboids, skilled palpation and monitoring during dry needling. Trigger Point Dry-Needling  Treatment instructions: Expect mild to moderate muscle soreness. S/S of pneumothorax if dry needled over a lung field, and to seek immediate medical attention should they occur. Patient verbalized understanding of these instructions and education.  Patient Consent Given: Yes Education handout provided: Previously provided Muscles treated: bil UT, levator scapulae Electrical stimulation performed: No Parameters: N/A Treatment response/outcome: Twitch Response Elicited and Palpable Increase in Muscle Length   12/12/2021 Therapeutic Exercise: to improve strength and mobility.  Demo, verbal and tactile cues throughout for technique. UBE L2 x 6 min ([id number]) Rows GTB 2 x 10  Bil shoulder extension GTB 2 x 10  Bil shoulder ER GTB 2 x 10 Bird dogs 2 x 10 bil  Child pose stretch between sets of bird dogs Manual Therapy: to decrease muscle spasm and pain and improve mobility PA/UPA mobs thoracic spine grade 3-4, PA mobs cervical spine in supine, NAGS into rotation, suboccipital release.   IASTM to bil rhomboids.  TPR R masseter.     PATIENT EDUCATION:  Education details: continue HEP Person educated: Patient Education method: ExpCustomer service managerucation comprehension: verbalized understanding and returned demonstration   HOME EXERCISE PROGRAM: Access Code: [email or web address]   ASSESSMENT:  CLINICAL IMPRESSION: Pt demonstrates some improvements in cervical rot and SB, still not quite full ROM. Pt is improvement in overall neck pain and thoracic spine tingling. She does  not do a lot of working [facility] so she was unable to gauge much improvement with this. Postural alignment has improved but she did report that at home in her recliner she tends to allow her head to protract and slouch her back. She still notes having headaches and tingling along her thoracic area.  She does note having much difficulty with household chores that require heavy UE use.    OBJECTIVE IMPAIRMENTS decreased ROM, decreased strength, hypomobility, increased muscle spasms, impaired flexibility, impaired UE functional use, and postural dysfunction.   ACTIVITY LIMITATIONS  [place] activity .   PERSONAL FACTORS Time since onset of injury/illness/exacerbation and 3+ comorbidities: OP, migraines, h/o thyroidectomy and radiation for breast CA  are also affecting patient's functional outcome.    GOALS: Goals reviewed with patient? Yes  SHORT TERM GOALS: Target date: 11/23/2021   Patient will be independent with initial HEP.  Baseline:  Goal status: MET   LONG TERM GOALS: Target date: 12/21/2021 extended to 02/15/22 Patient will be independent with advanced/ongoing HEP to improve outcomes and carryover.  Baseline:  Goal status: IN PROGRESS  2.  Patient will report 75% improvement in neck/back tingling to improve QOL.  Baseline:  Goal status: IN PROGRESS - 01/04/22 (50-60% improvement)  3.  Patient will demonstrate full cervical rotation for safety with driving and left lateral SB within 5 deg of R indicating improved flexibility.  Baseline:  Goal status: IN PROGRESS  4.  Patient will demonstrate improved posture to decrease muscle imbalance. Baseline:  Goal status: IN PROGRESS - 01/04/22 ( pt shows good postural alignment at rest, notes that when sitting at home in recliner)  5.  Patient will be able to work [facility] with 75% less difficulty.    Baseline:  Goal status: IN PROGRESS - 01/04/22 ( 30% improvement but pt reports that she does not do a lot of [place] stuff so it's hard to tell if any improvement)   PLAN: PT FREQUENCY: 2x/week  PT DURATION: 6 weeks to 02/15/22  PLANNED INTERVENTIONS: Therapeutic exercises, Therapeutic activity, Neuromuscular re-education, Patient/Family education, Dry Needling, Electrical stimulation, Spinal mobilization, Cryotherapy, Moist heat, Ultrasound,  and Manual therapy  PLAN FOR NEXT SESSION:  Progress HEP as needed, DN/MT, neck flexibility, upper back/postural strength, cervical stab   Clarene Essex, PTA 01/04/2022, 10:41 AM  Rennie Natter, PT, DPT 01/04/2022 2:37 PM

## 2022-01-11 ENCOUNTER — Ambulatory Visit: Payer: Medicare Other

## 2022-01-11 DIAGNOSIS — M542 Cervicalgia: Secondary | ICD-10-CM | POA: Diagnosis not present

## 2022-01-11 DIAGNOSIS — R252 Cramp and spasm: Secondary | ICD-10-CM

## 2022-01-11 DIAGNOSIS — M6281 Muscle weakness (generalized): Secondary | ICD-10-CM

## 2022-01-11 NOTE — Therapy (Signed)
OUTPATIENT PHYSICAL THERAPY TREATMENT NOTE   Patient Name: Kimberly Miles MRN: [id number] DOB:11-Jun-1956, 66 y.o., female Today's Date: 01/11/2022     PT End of Session - 01/11/22 1155     Visit Number 10    Date for PT Re-Evaluation 02/15/22    Authorization Type MCR    Progress Note Due on Visit 94    PT Start Time 1104    PT Stop Time 1145    PT Time Calculation (min) 41 min    Activity Tolerance Patient tolerated treatment well    Behavior During Therapy [facility] for tasks assessed/performed                Rationale for Evaluation and Treatment:  Rehabilitation    Past Medical History:  Diagnosis Date   Anxiety    Arthritis    Cancer ([place]) 11/2018   right breast cancer   Common migraine with intractable migraine 34/74/2595   Complication of anesthesia    Diverticulosis    Elevated LFTs    monitored by pcp per pt --  unknown etiology   Family history of premature CAD 09/01/2013   Family history of prostate cancer    Family history of uterine cancer    History of basal cell carcinoma excision    2004   &  2014   History of colitis    History of palpitations    History of thyroid nodule    s/p  right thyroid lobectomy 03-08-2011  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   IBS (irritable bowel syndrome)    Migraine    MTHFR gene mutation    Personal history of radiation therapy    PMB (postmenopausal bleeding)    PONV (postoperative nausea and vomiting)    Post-surgical hypothyroidism    Past Surgical History:  Procedure Laterality Date   APPENDECTOMY  as child   BREAST BIOPSY Left 1979   benign   BREAST EXCISIONAL BIOPSY Left    BREAST LUMPECTOMY Right 01/2019   BREAST LUMPECTOMY WITH RADIOACTIVE SEED AND SENTINEL LYMPH NODE BIOPSY Right 02/04/2019   Procedure: RIGHT BREAST LUMPECTOMY WITH RADIOACTIVE SEED AND RIGHT AXILLARY DEEP SENTINEL LYMPH NODE BIOPSY WITH BLUE DYE INJECTION;  Surgeon: Fanny Skates, MD;  Location: [place];  Service: General;  Laterality: Right;   BREAST REDUCTION SURGERY Bilateral 02/11/2019   Procedure: RIGHT BREAST ONCOPLASTIC RECONSTRUCTION, LEFT BREAST REDUCTION;  Surgeon: Irene Limbo, MD;  Location: [place];  Service: Plastics;  Laterality: Bilateral;   CARDIOVASCULAR STRESS TEST  09-16-2013   normal nuclear study w/ no ischemia/  normal LV function and wall motion , ef 84%   CESAREAN SECTION  1977 and 1978   Bilateral Tubal Ligation w/ last c/s   COLPOSCOPY  05/2009   CIN 1   HYSTEROSCOPY WITH D & C N/A 12/23/2015   Procedure: DILATATION AND CURETTAGE /HYSTEROSCOPY ;  Surgeon: Megan Salon, MD;  Location: [facility];  Service: Gynecology;  Laterality: N/A;   NEGATIVE SLEEP STUDY  2014  per pt   OOPHORECTOMY Right 1985   ruptured cyst    REDUCTION MAMMAPLASTY Bilateral 01/2019   THYROID LOBECTOMY Right 03-08-2011   TRANSTHORACIC ECHOCARDIOGRAM  05-09-2011   ef 64%/  mild MR and TR   TUBAL LIGATION     Patient Active Problem List   Diagnosis Date Noted   Paresthesia 11/24/2021   Hyperreflexia 11/24/2021   Lower abdominal pain 04/26/2021   Acute cough 04/26/2021  Vitamin D deficiency 04/26/2021   Abdominal bloating 04/26/2021   Age-related osteoporosis without current pathological fracture 04/09/2020   Genetic testing 08/07/2019   Family history of prostate cancer    Family history of uterine cancer    MTHFR gene mutation 01/16/2019   Epigastric pain 01/15/2019   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, estrogen receptor positive ([place]) 12/24/2018   Hypothyroid 11/11/2018   Memory change 11/13/2016   Common migraine with intractable migraine 04/06/2016   H/O partial thyroidectomy 01/14/2014   Family history of premature CAD 09/01/2013   History of migraine headaches 02/21/2013   Hyperlipidemia 02/21/2013   Thyroid function study abnormality 02/21/2013    PCP: Ann Held, DO  REFERRING  PROVIDER: Jessy Oto, MD  REFERRING DIAG: M54.2 (ICD-10-CM) - Cervicalgia M47.22 (ICD-10-CM) - Other spondylosis with radiculopathy, cervical region  THERAPY DIAG:  No diagnosis found.  ONSET DATE: 06/26/21  SUBJECTIVE:                                                                                                                                                                                                         SUBJECTIVE STATEMENT: Pt reports she is doing better, still feels tingling more of an annoyance rather than pain.  PERTINENT HISTORY:  Migraines, R lumpectomy and radiation 2020/2021,osteoporosis, thyroidectomy (one lobe precancerous)  PAIN:  Are you having pain? No    PRECAUTIONS: Other: h/o R lumpectomy and radiation 2020/2021,osteoporosis  PATIENT GOALS get rid of the numbness and tingling  OBJECTIVE: (objective measurements taken at eval unless otherwise dated)   DIAGNOSTIC FINDINGS:  XR 11/03/21: mild degenerative disc disease C5-6 and C6-7. There is uncovertebral hypertrophy at the C5-6 level and mild C6-7. Some reversal of the lover one half of the cervical spine. MRI 10/15/21: 1. At C5-6 there is a tiny right paracentral disc protrusion. Bilateral uncovertebral degenerative changes. Moderate bilateral foraminal stenosis. 2. At C6-7 there is a mild broad-based disc bulge. Bilateral uncovertebral degenerative changes. Mild bilateral foraminal stenosis. 12/02/2021 IMPRESSION: This MRI of the thoracic spine with and without contrast shows the following: 1.   The spinal cord appears normal before and after contrast. 2.   Mild degenerative changes in the lower cervical spine not leading to spinal stenosis or nerve root compression.  PATIENT SURVEYS:  FOTO FS = 70 (predicted 71)  SENSATION: Decreased sensation in ant neck due to thryroid surgery Some decreased sensation in right scapula and b/w spine and scapula  POSTURE:  Rounded shoulders, else  WNL  PALPATION: Increased muscle tension in  B UT and right cervical paraspinals and subocciptals Decreased R sided lateral glides and R UPA mobility in C2/3, 3/4, C5/6  CERVICAL ROM:    ROM A/PROM (deg) 11/09/2021 AROM 01/04/22  Flexion full   Extension full   Right lateral flexion 29 / full 31 deg  Left lateral flexion 13 / full but tight 29 deg  Right rotation 72 Pain on R / full 60 deg  Left rotation 54 Pull on L down to scapula / full 63 deg   (Blank rows = not tested)  UE ROM: WNL     UE MMT: 5/5 UE and neck except deep neck flexors 5-/5   Mid traps R 5/5 L 4+/5   Low traps R 4+/5 L 4/5  CERVICAL SPECIAL TESTS:  Negative compression/distraction   TODAY'S TREATMENT:  01/09/22 Therapeutic Exercise: UBE L2 x 6 min (4f3b) Standing trunk rotation with red TB 10x bil Standing horizontal ABD RTB 10x  BATCA lat pull 15lb x 10 Standing shoulder extensions with RTB 2x10 Thoracic extension over foam roll 10x3"  Manual Therapy: STM to R suboccipitals, rhomboids, thoracic paraspinals  01/04/22 Therapeutic Exercise: Nustep L5x624m Assessed LTGs and discussed progress with patient Standing row GTB x 10 Standing extension GTB x 10 Standing ER with GTB x 10 Cervical extension with pillowcase for stabilization 10x3"  12/15/2021 Therapeutic Exercise: to improve strength and mobility.  Demo, verbal and tactile cues throughout for technique. UBE L2 x 6 min (9f46f) Rows GTB 2 x 10  tactile cues to decrease UT engagement Bil shoulder extension GTB 2 x 10  tactile cues to decrease UT engagement Thoracic mobs with foam roller.  On foam roller - arm flexion x 20 bil, angels x 20 Manual Therapy: to decrease muscle spasm and pain and improve mobility PA mobs thoracic spine, TPR to rhomboids, skilled palpation and monitoring during dry needling. Trigger Point Dry-Needling  Treatment instructions: Expect mild to moderate muscle soreness. S/S of pneumothorax if dry needled over a lung  field, and to seek immediate medical attention should they occur. Patient verbalized understanding of these instructions and education.  Patient Consent Given: Yes Education handout provided: Previously provided Muscles treated: bil UT, levator scapulae Electrical stimulation performed: No Parameters: N/A Treatment response/outcome: Twitch Response Elicited and Palpable Increase in Muscle Length      PATIENT EDUCATION:  Education details: continue HEP Person educated: Patient Education method: ExpCustomer service managerucation comprehension: verbalized understanding and returned demonstration   HOME EXERCISE PROGRAM: Access Code: [email or web address]   ASSESSMENT:  CLINICAL IMPRESSION: Pt presented today feeling better but still with reports of tingling along thoracic spine, however no pain. We continued progressing postural strengthening focusing on scapular retraction and chest opening. She did well with thoracic extension on foam roll, tried I's on foam roll but this was causing headaches, so deferred. Finished session with MT to address muscle tension along R thoracic spine, pt did report improvement in tension post MT.    OBJECTIVE IMPAIRMENTS decreased ROM, decreased strength, hypomobility, increased muscle spasms, impaired flexibility, impaired UE functional use, and postural dysfunction.   ACTIVITY LIMITATIONS  [place] activity .   PERSONAL FACTORS Time since onset of injury/illness/exacerbation and 3+ comorbidities: OP, migraines, h/o thyroidectomy and radiation for breast CA  are also affecting patient's functional outcome.    GOALS: Goals reviewed with patient? Yes  SHORT TERM GOALS: Target date: 11/23/2021   Patient will be independent with initial HEP.  Baseline:  Goal status: MET   LONG TERM GOALS: Target date:  02/15/22 Patient will be independent with advanced/ongoing HEP to improve outcomes and carryover.  Baseline:  Goal status: IN PROGRESS  2.  Patient will  report 75% improvement in neck/back tingling to improve QOL.  Baseline:  Goal status: IN PROGRESS - 01/04/22 (50-60% improvement)  3.  Patient will demonstrate full cervical rotation for safety with driving and left lateral SB within 5 deg of R indicating improved flexibility.  Baseline:  Goal status: IN PROGRESS  4.  Patient will demonstrate improved posture to decrease muscle imbalance. Baseline:  Goal status: IN PROGRESS - 01/04/22 ( pt shows good postural alignment at rest, notes that when sitting at home in recliner)  5.  Patient will be able to work [facility] with 75% less difficulty.    Baseline:  Goal status: IN PROGRESS - 01/04/22 ( 30% improvement but pt reports that she does not do a lot of [place] stuff so it's hard to tell if any improvement)   PLAN: PT FREQUENCY: 2x/week  PT DURATION: 6 weeks to 02/15/22  PLANNED INTERVENTIONS: Therapeutic exercises, Therapeutic activity, Neuromuscular re-education, Patient/Family education, Dry Needling, Electrical stimulation, Spinal mobilization, Cryotherapy, Moist heat, Ultrasound, and Manual therapy  PLAN FOR NEXT SESSION:  Progress HEP as needed, DN/MT, neck flexibility, upper back/postural strength, cervical stab   Clarene Essex, PTA 01/11/2022, 11:55 AM

## 2022-01-12 ENCOUNTER — Encounter (HOSPITAL_BASED_OUTPATIENT_CLINIC_OR_DEPARTMENT_OTHER): Payer: Self-pay | Admitting: Obstetrics & Gynecology

## 2022-01-12 ENCOUNTER — Ambulatory Visit (INDEPENDENT_AMBULATORY_CARE_PROVIDER_SITE_OTHER): Payer: Medicare Other | Admitting: Obstetrics & Gynecology

## 2022-01-12 VITALS — BP 116/74 | HR 67 | Ht 59.0 in | Wt 111.8 lb

## 2022-01-12 DIAGNOSIS — G43009 Migraine without aura, not intractable, without status migrainosus: Secondary | ICD-10-CM | POA: Diagnosis not present

## 2022-01-12 DIAGNOSIS — M810 Age-related osteoporosis without current pathological fracture: Secondary | ICD-10-CM | POA: Diagnosis not present

## 2022-01-12 DIAGNOSIS — Z124 Encounter for screening for malignant neoplasm of cervix: Secondary | ICD-10-CM

## 2022-01-12 DIAGNOSIS — C50311 Malignant neoplasm of lower-inner quadrant of right female breast: Secondary | ICD-10-CM | POA: Diagnosis not present

## 2022-01-12 DIAGNOSIS — Z170 Estrogen receptor positive status [ER+]: Secondary | ICD-10-CM

## 2022-01-12 DIAGNOSIS — Z9189 Other specified personal risk factors, not elsewhere classified: Secondary | ICD-10-CM | POA: Diagnosis not present

## 2022-01-12 DIAGNOSIS — Z780 Asymptomatic menopausal state: Secondary | ICD-10-CM

## 2022-01-12 DIAGNOSIS — Z8049 Family history of malignant neoplasm of other genital organs: Secondary | ICD-10-CM | POA: Diagnosis not present

## 2022-01-12 NOTE — Progress Notes (Signed)
66 y.o. G2P2 Married Declined female here for breast and pelvic exam.  I am also following her for history of breast cancer.  Diagnosed with breast cancer 11/2018.  T2N0 Stage 1B.  Took anastrozole for 1 month pre-op and then right lumpectomy 01/2019.  2.5cm with neg margins and 3 negative lymph nodes.  Now on letrozole.  Had breast MRI 05/2021.  Dr. Lindi Miles ordered this.  Has not done mammogram this summer.  She knows it is due.    Has seen some brownish discharge.  Doesn't think this is bleeding.    Having some increased issues with migraines as treatments are not working for her now.  Maxalt, Emerge, for example, are ones she has tried and no longer works.  Would like to see specialist.  Patient's last menstrual period was 08/25/2010.          Sexually active: Yes.    H/O STD:  no  Health Maintenance: PCP:  Dr. Carollee Miles.  Last wellness appt was 09/2021.  Did blood work at that appt: yes Vaccines are up to date:  reviewed.  Has not done shingles vaccination. Colonoscopy:  10/09/02006.  Cologuard neg 2022. MMG:  12/2020.  Pt aware this is due. BMD:  08/29/2021, -2.5 Last pap smear:  12/30/2018 Negative.   H/o abnormal pap smear:  no    reports that she has never smoked. She has never used smokeless tobacco. She reports that she does not currently use alcohol. She reports that she does not use drugs.  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   IBS (irritable bowel syndrome)    Migraine    MTHFR gene mutation    Personal history of radiation therapy    PMB (postmenopausal bleeding)    PONV (postoperative nausea and vomiting)    Post-surgical hypothyroidism     Past Surgical History:  Procedure Laterality Date   APPENDECTOMY  as child   BREAST BIOPSY Left 1979   benign   BREAST EXCISIONAL BIOPSY Left    BREAST LUMPECTOMY Right 01/2019   BREAST LUMPECTOMY WITH RADIOACTIVE SEED AND SENTINEL LYMPH NODE BIOPSY Right 02/04/2019   Procedure: RIGHT BREAST LUMPECTOMY WITH RADIOACTIVE SEED AND RIGHT AXILLARY DEEP SENTINEL LYMPH NODE BIOPSY WITH BLUE DYE INJECTION;  Surgeon: Kimberly Skates, MD;  Location: [facility];  Service: General;  Laterality: Right;   BREAST REDUCTION SURGERY Bilateral 02/11/2019   Procedure: RIGHT BREAST ONCOPLASTIC RECONSTRUCTION, LEFT BREAST REDUCTION;  Surgeon: Kimberly Limbo, MD;  Location: [facility];  Service: Plastics;  Laterality: Bilateral;   CARDIOVASCULAR STRESS TEST  09-16-2013   normal nuclear study w/ no ischemia/  normal LV function and wall motion , ef 84%   CESAREAN SECTION  1977 and 1978   Bilateral  Tubal Ligation w/ last c/s   COLPOSCOPY  05/2009   CIN 1   HYSTEROSCOPY WITH D & C N/A 12/23/2015   Procedure: DILATATION AND CURETTAGE /HYSTEROSCOPY ;  Surgeon: Kimberly Salon, MD;  Location: [facility];  Service: Gynecology;  Laterality: N/A;   NEGATIVE SLEEP STUDY  2014  per pt   OOPHORECTOMY Right 1985   ruptured cyst    REDUCTION MAMMAPLASTY Bilateral 01/2019   THYROID LOBECTOMY Right 03-08-2011   TRANSTHORACIC ECHOCARDIOGRAM  05-09-2011   ef 64%/  mild MR and TR   TUBAL LIGATION      Current Outpatient Medications  Medication Sig Dispense Refill   Ascorbic Acid (VITAMIN C) 1000 MG tablet Take 1,000 mg by mouth daily. 2 tablet daily     Cholecalciferol (VITAMIN D3) 5000 UNITS CAPS Take 1 capsule by mouth  daily.     exemestane (AROMASIN) 25 MG tablet Take 1 tablet (25 mg total) by mouth daily after breakfast. 90 tablet 1   zinc gluconate 50 MG tablet Take 15 mg by mouth daily.     ASTAXANTHIN PO Take by mouth. (Patient not taking: Reported on 01/12/2022)     metaxalone (SKELAXIN) 800 MG tablet Take 0.5 tablets (400 mg total) by mouth 3 (three) times daily. (Patient not taking: Reported on 11/24/2021) 30 tablet 0   No current facility-administered medications for this visit.    Family History  Problem Relation Age of Onset   Hypertension Maternal Grandmother    Uterine cancer Maternal Grandmother 61   Vascular Disease Maternal Grandmother    Lung cancer Maternal Grandmother 50   Stroke Maternal Grandfather    Prostate cancer Paternal Grandfather 52   Migraines Paternal Grandmother    Lung cancer Mother 51   Hypertension Mother 91   Hyperlipidemia Mother 18   Ehlers-Danlos syndrome Mother    Kidney disease Mother    Fibromyalgia Mother    Other Father        bypass surgery times 2   Migraines Father    Osteoporosis Father    Prostate cancer Father    Kidney disease Maternal Aunt     Review of Systems  All other systems reviewed and are negative.   Exam:   BP 116/74 (BP Location: Left Arm, Patient Position: Sitting, Cuff Size: Normal)   Pulse 67   Ht '4\' 11"'$  (1.499 m) Comment: reported  Wt 111 lb 12.8 oz (50.7 kg)   LMP 08/25/2010   BMI 22.58 kg/m   Height: '4\' 11"'$  (149.9 cm) (reported)  General appearance: alert, cooperative and appears stated age Breasts: normal appearance, no masses or tenderness, well healed breast scars Abdomen: soft, non-tender; bowel sounds normal; no masses,  no organomegaly Lymph nodes: Cervical, supraclavicular, and axillary nodes normal.  No abnormal inguinal nodes palpated Neurologic: Grossly normal  Pelvic: External genitalia:  no lesions              Urethra:  normal appearing urethra with no masses, tenderness or lesions               Bartholins and Skenes: normal                 Vagina: normal appearing vagina with atrophic changes and no discharge, atrophic changes present, yellowish vaginal discharge present c/w atrophic tissue discharge              Cervix: no lesions              Pap taken:  Yes.   Bimanual Exam:  Uterus:  normal size, contour, position, consistency, mobility, non-tender              Adnexa: normal adnexa and no mass, fullness, tenderness               Rectovaginal: Confirms               Anus:  normal sphincter tone, no lesions  Chaperone, Octaviano Batty, CMA, was present for exam.  Assessment/Plan: 1. GYN exam for high-risk Medicare patient - Pap smear obtained today - Mammogram 12/2020 - Colonoscopy 04/03/2005.  Cologuard neg 2022. - Bone mineral density 08/2021 - lab work done done with PCP - vaccines reviewed/updated  2. Postmenopausal - no HRT. OTC Vaginal moisturizes discussed  3. Age-related osteoporosis without current pathological fracture - Does not desire treatment right now.  Will plan to repeat in two years.  4. Family history of uterine cancer  5. 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 Cervical cancer screening - Cytology - PAP( [place]) - PR OBTAINING SCREEN PAP SMEAR  7.  Migraines - referral to Dr. Tomi Likens placed today.

## 2022-01-15 ENCOUNTER — Encounter (HOSPITAL_BASED_OUTPATIENT_CLINIC_OR_DEPARTMENT_OTHER): Payer: Self-pay | Admitting: Obstetrics & Gynecology

## 2022-01-18 ENCOUNTER — Ambulatory Visit: Payer: Medicare Other

## 2022-01-18 DIAGNOSIS — R252 Cramp and spasm: Secondary | ICD-10-CM | POA: Diagnosis not present

## 2022-01-18 DIAGNOSIS — M6281 Muscle weakness (generalized): Secondary | ICD-10-CM | POA: Diagnosis not present

## 2022-01-18 DIAGNOSIS — M542 Cervicalgia: Secondary | ICD-10-CM | POA: Diagnosis not present

## 2022-01-18 NOTE — Therapy (Signed)
OUTPATIENT PHYSICAL THERAPY TREATMENT NOTE   Patient Name: Kimberly Miles MRN: [id number] DOB:07-01-1955, 66 y.o., female Today's Date: 01/18/2022     PT End of Session - 01/18/22 1154     Visit Number 11    Date for PT Re-Evaluation 02/15/22    Authorization Type MCR    Progress Note Due on Visit 14    PT Start Time 1103    PT Stop Time 4098    PT Time Calculation (min) 42 min    Activity Tolerance Patient tolerated treatment well    Behavior During Therapy [facility] for tasks assessed/performed                 Rationale for Evaluation and Treatment:  Rehabilitation    Past Medical History:  Diagnosis Date   Anxiety    Arthritis    Cancer ([place]) 11/2018   right breast cancer   Common migraine with intractable migraine 11/91/4782   Complication of anesthesia    Diverticulosis    Elevated LFTs    monitored by pcp per pt --  unknown etiology   Family history of premature CAD 09/01/2013   Family history of prostate cancer    Family history of uterine cancer    History of basal cell carcinoma excision    2004   &  2014   History of colitis    History of palpitations    History of thyroid nodule    s/p  right thyroid lobectomy 03-08-2011  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   IBS (irritable bowel syndrome)    Migraine    MTHFR gene mutation    Personal history of radiation therapy    PMB (postmenopausal bleeding)    PONV (postoperative nausea and vomiting)    Post-surgical hypothyroidism    Past Surgical History:  Procedure Laterality Date   APPENDECTOMY  as child   BREAST BIOPSY Left 1979   benign   BREAST EXCISIONAL BIOPSY Left    BREAST LUMPECTOMY Right 01/2019   BREAST LUMPECTOMY WITH RADIOACTIVE SEED AND SENTINEL LYMPH NODE BIOPSY Right 02/04/2019   Procedure: RIGHT BREAST LUMPECTOMY WITH RADIOACTIVE SEED AND RIGHT AXILLARY DEEP SENTINEL LYMPH NODE BIOPSY WITH BLUE DYE INJECTION;  Surgeon: Fanny Skates, MD;  Location: [facility];  Service: General;  Laterality: Right;   BREAST REDUCTION SURGERY Bilateral 02/11/2019   Procedure: RIGHT BREAST ONCOPLASTIC RECONSTRUCTION, LEFT BREAST REDUCTION;  Surgeon: Irene Limbo, MD;  Location: [facility];  Service: Plastics;  Laterality: Bilateral;   CARDIOVASCULAR STRESS TEST  09-16-2013   normal nuclear study w/ no ischemia/  normal LV function and wall motion , ef 84%   CESAREAN SECTION  1977 and 1978   Bilateral Tubal Ligation w/ last c/s   COLPOSCOPY  05/2009   CIN 1   HYSTEROSCOPY WITH D & C N/A 12/23/2015   Procedure: DILATATION AND CURETTAGE /HYSTEROSCOPY ;  Surgeon: Megan Salon, MD;  Location[facility];  Service: Gynecology;  Laterality: N/A;   NEGATIVE SLEEP STUDY  2014  per pt   OOPHORECTOMY Right 1985   ruptured cyst    REDUCTION MAMMAPLASTY Bilateral 01/2019   THYROID LOBECTOMY Right 03-08-2011   TRANSTHORACIC ECHOCARDIOGRAM  05-09-2011   ef 64%/  mild MR and TR   TUBAL LIGATION     Patient Active Problem List   Diagnosis Date Noted   Paresthesia 11/24/2021   Hyperreflexia 11/24/2021   Lower abdominal pain 04/26/2021   Acute cough 04/26/2021  Vitamin D deficiency 04/26/2021   Abdominal bloating 04/26/2021   Age-related osteoporosis without current pathological fracture 04/09/2020   Genetic testing 08/07/2019   Family history of prostate cancer    Family history of uterine cancer    MTHFR gene mutation 01/16/2019   Epigastric pain 01/15/2019   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, estrogen receptor positive ([place]) 12/24/2018   Hypothyroid 11/11/2018   Memory change 11/13/2016   Common migraine with intractable migraine 04/06/2016   H/O partial thyroidectomy 01/14/2014   Family history of premature CAD 09/01/2013   History of migraine headaches 02/21/2013   Hyperlipidemia 02/21/2013   Thyroid function study abnormality 02/21/2013    PCP: Ann Held, DO  REFERRING  PROVIDER: Jessy Oto, MD  REFERRING DIAG: M54.2 (ICD-10-CM) - Cervicalgia M47.22 (ICD-10-CM) - Other spondylosis with radiculopathy, cervical region  THERAPY DIAG:  Cervicalgia  Cramp and spasm  Muscle weakness (generalized)  ONSET DATE: 06/26/21  SUBJECTIVE:                                                                                                                                                                                                         SUBJECTIVE STATEMENT: Doing well, just stiff in neck  PERTINENT HISTORY:  Migraines, R lumpectomy and radiation 2020/2021,osteoporosis, thyroidectomy (one lobe precancerous)  PAIN:  Are you having pain? No    PRECAUTIONS: Other: h/o R lumpectomy and radiation 2020/2021,osteoporosis  PATIENT GOALS get rid of the numbness and tingling  OBJECTIVE: (objective measurements taken at eval unless otherwise dated)   DIAGNOSTIC FINDINGS:  XR 11/03/21: mild degenerative disc disease C5-6 and C6-7. There is uncovertebral hypertrophy at the C5-6 level and mild C6-7. Some reversal of the lover one half of the cervical spine. MRI 10/15/21: 1. At C5-6 there is a tiny right paracentral disc protrusion. Bilateral uncovertebral degenerative changes. Moderate bilateral foraminal stenosis. 2. At C6-7 there is a mild broad-based disc bulge. Bilateral uncovertebral degenerative changes. Mild bilateral foraminal stenosis. 12/02/2021 IMPRESSION: This MRI of the thoracic spine with and without contrast shows the following: 1.   The spinal cord appears normal before and after contrast. 2.   Mild degenerative changes in the lower cervical spine not leading to spinal stenosis or nerve root compression.  PATIENT SURVEYS:  FOTO FS = 70 (predicted 71)  SENSATION: Decreased sensation in ant neck due to thryroid surgery Some decreased sensation in right scapula and b/w spine and scapula  POSTURE:  Rounded shoulders, else WNL  PALPATION: Increased  muscle tension in B UT and right  cervical paraspinals and subocciptals Decreased R sided lateral glides and R UPA mobility in C2/3, 3/4, C5/6  CERVICAL ROM:    ROM A/PROM (deg) 11/09/2021 AROM 01/04/22  Flexion full   Extension full   Right lateral flexion 29 / full 31 deg  Left lateral flexion 13 / full but tight 29 deg  Right rotation 72 Pain on R / full 60 deg  Left rotation 54 Pull on L down to scapula / full 63 deg   (Blank rows = not tested)  UE ROM: WNL     UE MMT: 5/5 UE and neck except deep neck flexors 5-/5   Mid traps R 5/5 L 4+/5   Low traps R 4+/5 L 4/5  CERVICAL SPECIAL TESTS:  Negative compression/distraction   TODAY'S TREATMENT:  01/18/22 Therapeutic Exercise: UBE L2 x 6 min ([id number]) Low horizontal ABD RTB 2x10 Low row with RTB 2x10 High horiz ABD red TB 2x5 Low trap set with shoulder flexion using RTB x 10 Clocks on wall with RTB x 10 Seated thoracic ext sitting in chair on foam roll  2x10 01/09/22 Therapeutic Exercise: UBE L2 x 6 min ([id number]) Standing trunk rotation with red TB 10x bil Standing horizontal ABD RTB 10x  BATCA lat pull 15lb x 10 Standing shoulder extensions with RTB 2x10 Thoracic extension over foam roll 10x3"  Manual Therapy: STM to R suboccipitals, rhomboids, thoracic paraspinals  01/04/22 Therapeutic Exercise: Nustep [id number] LTGs and discussed progress with patient Standing row GTB x 10 Standing extension GTB x 10 Standing ER with GTB x 10 Cervical extension with pillowcase for stabilization 10x3"       PATIENT EDUCATION:  Education details: continue HEP Person educated: Patient Education method: ExpCustomer service managerucation comprehension: verbalized understanding and returned demonstration   HOME EXERCISE PROGRAM: Access Code: [email or web address]   ASSESSMENT:  CLINICAL IMPRESSION: Pt showed a good response to the progression of exercises. Worked on more lower trap focused exercises to improve  scapulohumeral stability. Pt demonstrated fatigue in posterior shoulder after TB exercises. Gave new exercises for HEP to include low trap focused strengthening. Pt would continue to benefit from progressing postural strengthening.   OBJECTIVE IMPAIRMENTS decreased ROM, decreased strength, hypomobility, increased muscle spasms, impaired flexibility, impaired UE functional use, and postural dysfunction.   ACTIVITY LIMITATIONS  [place] activity .   PERSONAL FACTORS Time since onset of injury/illness/exacerbation and 3+ comorbidities: OP, migraines, h/o thyroidectomy and radiation for breast CA  are also affecting patient's functional outcome.    GOALS: Goals reviewed with patient? Yes  SHORT TERM GOALS: Target date: 11/23/2021   Patient will be independent with initial HEP.  Baseline:  Goal status: MET   LONG TERM GOALS: Target date: 02/15/22 Patient will be independent with advanced/ongoing HEP to improve outcomes and carryover.  Baseline:  Goal status: IN PROGRESS  2.  Patient will report 75% improvement in neck/back tingling to improve QOL.  Baseline:  Goal status: IN PROGRESS - 01/04/22 (50-60% improvement)  3.  Patient will demonstrate full cervical rotation for safety with driving and left lateral SB within 5 deg of R indicating improved flexibility.  Baseline:  Goal status: IN PROGRESS  4.  Patient will demonstrate improved posture to decrease muscle imbalance. Baseline:  Goal status: IN PROGRESS - 01/04/22 ( pt shows good postural alignment at rest, notes that when sitting at home in recliner)  5.  Patient will be able to work [place] [facility] less difficulty.    Baseline:  Goal status: IN PROGRESS -  01/04/22 ( 30% improvement but pt reports that she does not do a lot of [place] stuff so it's hard to tell if any improvement)   PLAN: PT FREQUENCY: 2x/week  PT DURATION: 6 weeks to 02/15/22  PLANNED INTERVENTIONS: Therapeutic exercises, Therapeutic activity, Neuromuscular re-education,  Patient/Family education, Dry Needling, Electrical stimulation, Spinal mobilization, Cryotherapy, Moist heat, Ultrasound, and Manual therapy  PLAN FOR NEXT SESSION:  Progress HEP as needed, DN/MT, neck flexibility, upper back/postural strength, cervical stab   Clarene Essex, PTA 01/18/2022, 11:54 AM

## 2022-01-20 ENCOUNTER — Encounter: Payer: Self-pay | Admitting: Physical Therapy

## 2022-01-20 ENCOUNTER — Ambulatory Visit: Payer: Medicare Other | Admitting: Physical Therapy

## 2022-01-20 DIAGNOSIS — M6281 Muscle weakness (generalized): Secondary | ICD-10-CM

## 2022-01-20 DIAGNOSIS — R252 Cramp and spasm: Secondary | ICD-10-CM | POA: Diagnosis not present

## 2022-01-20 DIAGNOSIS — M542 Cervicalgia: Secondary | ICD-10-CM

## 2022-01-20 LAB — CYTOLOGY - PAP

## 2022-01-20 NOTE — Therapy (Signed)
OUTPATIENT PHYSICAL THERAPY TREATMENT NOTE   Patient Name: Kimberly Miles MRN: [id number] DOB:[birth year], 66 y.o., female Today's Date: 01/20/2022     PT End of Session - 01/20/22 1100     Visit Number 12    Date for PT Re-Evaluation 02/15/22    Authorization Type MCR    Progress Note Due on Visit 21    PT Start Time 1101    PT Stop Time 1148    PT Time Calculation (min) 47 min    Activity Tolerance Patient tolerated treatment well    Behavior During Therapy [facility] for tasks assessed/performed                 Rationale for Evaluation and Treatment:  Rehabilitation    Past Medical History:  Diagnosis Date   Anxiety    Arthritis    Cancer ([place]) 11/2018   right breast cancer   Common migraine with intractable migraine 66/11/3014   Complication of anesthesia    Diverticulosis    Elevated LFTs    monitored by pcp per pt --  unknown etiology   Family history of premature CAD 09/01/2013   Family history of prostate cancer    Family history of uterine cancer    History of basal cell carcinoma excision    2004   &  2014   History of colitis    History of palpitations    History of thyroid nodule    s/p  right thyroid lobectomy 03-08-2011  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   IBS (irritable bowel syndrome)    Migraine    MTHFR gene mutation    Personal history of radiation therapy    PMB (postmenopausal bleeding)    PONV (postoperative nausea and vomiting)    Post-surgical hypothyroidism    Past Surgical History:  Procedure Laterality Date   APPENDECTOMY  as child   BREAST BIOPSY Left 1979   benign   BREAST EXCISIONAL BIOPSY Left    BREAST LUMPECTOMY Right 01/2019   BREAST LUMPECTOMY WITH RADIOACTIVE SEED AND SENTINEL LYMPH NODE BIOPSY Right 02/04/2019   Procedure: RIGHT BREAST LUMPECTOMY WITH RADIOACTIVE SEED AND RIGHT AXILLARY DEEP SENTINEL LYMPH NODE BIOPSY WITH BLUE DYE INJECTION;  Surgeon: Fanny Skates, MD;  Location: [place];  Service: General;  Laterality: Right;   BREAST REDUCTION SURGERY Bilateral 02/11/2019   Procedure: RIGHT BREAST ONCOPLASTIC RECONSTRUCTION, LEFT BREAST REDUCTION;  Surgeon: Irene Limbo, MD;  Location: [place];  Service: Plastics;  Laterality: Bilateral;   CARDIOVASCULAR STRESS TEST  09-16-2013   normal nuclear study w/ no ischemia/  normal LV function and wall motion , ef 84%   CESAREAN SECTION  1977 and 1978   Bilateral Tubal Ligation w/ last c/s   COLPOSCOPY  05/2009   CIN 1   HYSTEROSCOPY WITH D & C N/A 12/23/2015   Procedure: DILATATION AND CURETTAGE /HYSTEROSCOPY ;  Surgeon: Megan Salon, MD;  Location: [facility];  Service: Gynecology;  Laterality: N/A;   NEGATIVE SLEEP STUDY  2014  per pt   OOPHORECTOMY Right 1985   ruptured cyst    REDUCTION MAMMAPLASTY Bilateral 01/2019   THYROID LOBECTOMY Right 03-08-2011   TRANSTHORACIC ECHOCARDIOGRAM  05-09-2011   ef 64%/  mild MR and TR   TUBAL LIGATION     Patient Active Problem List   Diagnosis Date Noted   Paresthesia 11/24/2021   Hyperreflexia 11/24/2021   Lower abdominal pain 04/26/2021   Acute cough 04/26/2021  Vitamin D deficiency 04/26/2021   Abdominal bloating 04/26/2021   Age-related osteoporosis without current pathological fracture 04/09/2020   Genetic testing 08/07/2019   Family history of prostate cancer    Family history of uterine cancer    MTHFR gene mutation 01/16/2019   Epigastric pain 01/15/2019   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, estrogen receptor positive ([place]) 12/24/2018   Hypothyroid 11/11/2018   Memory change 11/13/2016   Common migraine with intractable migraine 04/06/2016   H/O partial thyroidectomy 01/14/2014   Family history of premature CAD 09/01/2013   History of migraine headaches 02/21/2013   Hyperlipidemia 02/21/2013   Thyroid function study abnormality 02/21/2013    PCP: Ann Held, DO  REFERRING  PROVIDER: Jessy Oto, MD  REFERRING DIAG: M54.2 (ICD-10-CM) - Cervicalgia M47.22 (ICD-10-CM) - Other spondylosis with radiculopathy, cervical region  THERAPY DIAG:  Cervicalgia  Cramp and spasm  Muscle weakness (generalized)  ONSET DATE: 06/26/21  SUBJECTIVE:                                                                                                                                                                                                         SUBJECTIVE STATEMENT: Patient reports neck is ok, but feels tingling between shoulder blades, maybe overdid overhead exercises last time.    PERTINENT HISTORY:  Migraines, R lumpectomy and radiation 2020/2021,osteoporosis, thyroidectomy (one lobe precancerous)  PAIN:  Are you having pain? No   but reports some tightness in neck/tinging in thoracic region, and discomfort with movement.   PRECAUTIONS: Other: h/o R lumpectomy and radiation 2020/2021,osteoporosis  PATIENT GOALS get rid of the numbness and tingling  OBJECTIVE: (objective measurements taken at eval unless otherwise dated)   DIAGNOSTIC FINDINGS:  XR 11/03/21: mild degenerative disc disease C5-6 and C6-7. There is uncovertebral hypertrophy at the C5-6 level and mild C6-7. Some reversal of the lover one half of the cervical spine. MRI 10/15/21: 1. At C5-6 there is a tiny right paracentral disc protrusion. Bilateral uncovertebral degenerative changes. Moderate bilateral foraminal stenosis. 2. At C6-7 there is a mild broad-based disc bulge. Bilateral uncovertebral degenerative changes. Mild bilateral foraminal stenosis. 12/02/2021 IMPRESSION: This MRI of the thoracic spine with and without contrast shows the following: 1.   The spinal cord appears normal before and after contrast. 2.   Mild degenerative changes in the lower cervical spine not leading to spinal stenosis or nerve root compression.  PATIENT SURVEYS:  FOTO FS = 70 (predicted 71)  SENSATION: Decreased  sensation in ant neck due to thryroid surgery Some  decreased sensation in right scapula and b/w spine and scapula  POSTURE:  Rounded shoulders, else WNL  PALPATION: Increased muscle tension in B UT and right cervical paraspinals and subocciptals Decreased R sided lateral glides and R UPA mobility in C2/3, 3/4, C5/6  CERVICAL ROM:    ROM A/PROM (deg) 11/09/2021 AROM 01/04/22  Flexion full   Extension full   Right lateral flexion 29 / full 31 deg  Left lateral flexion 13 / full but tight 29 deg  Right rotation 72 Pain on R / full 60 deg  Left rotation 54 Pull on L down to scapula / full 63 deg   (Blank rows = not tested)  UE ROM: WNL     UE MMT: 5/5 UE and neck except deep neck flexors 5-/5   Mid traps R 5/5 L 4+/5   Low traps R 4+/5 L 4/5  CERVICAL SPECIAL TESTS:  Negative compression/distraction   TODAY'S TREATMENT:  01/20/22 Therapeutic Exercise: to improve strength and mobility.   UBE L2 x 6 min (70f3b) Thoracic stretches  On lengthwise foam roller - shoulder flexion x 10 bil , Y's x 10, angels x 10 - tolerated well reported decreased tightness in R shoulder Thoracic mob over foam roller - increased parasthesias bil hands.  Manual Therapy: to decrease muscle spasm and pain and improve mobility.  TPR to R levator and subscap, UPA mobs T4, skilled palpation and monitoring during dry needling. Trigger Point Dry-Needling  Treatment instructions: Expect mild to moderate muscle soreness. S/S of pneumothorax if dry needled over a lung field, and to seek immediate medical attention should they occur. Patient verbalized understanding of these instructions and education.  Patient Consent Given: Yes Education handout provided: Previously provided Muscles treated: bil T4 multifidi, R levator, R UT, R supraspinatus Treatment response/outcome: Twitch Response Elicited and Palpable Increase in Muscle Length   01/18/22 Therapeutic Exercise: UBE L2 x 6 min (347fb) Low horizontal  ABD RTB 2x10 Low row with RTB 2x10 High horiz ABD red TB 2x5 Low trap set with shoulder flexion using RTB x 10 Clocks on wall with RTB x 10 Seated thoracic ext sitting in chair on foam roll  2x10  01/09/22 Therapeutic Exercise: UBE L2 x 6 min (61f5f) Standing trunk rotation with red TB 10x bil Standing horizontal ABD RTB 10x  BATCA lat pull 15lb x 10 Standing shoulder extensions with RTB 2x10 Thoracic extension over foam roll 10x3"  Manual Therapy: STM to R suboccipitals, rhomboids, thoracic paraspinals  PATIENT EDUCATION:  Education details: continue HEP Person educated: Patient Education method: ExpCustomer service managerucation comprehension: verbalized understanding and returned demonstration   HOME EXERCISE PROGRAM: Access Code: [email or web address]   ASSESSMENT:  CLINICAL IMPRESSION: Today focused on thoracic mobility with exercises as continues to report intermittant parasthesias in hands, and demonstrates tenderness at T4, she tolerated thoracic mobs while lengthwise on foam roller, but crosswise still placed too much pressure on thoracic region, resulting in more parasthesias.  Manual therapy focused on areas that can refer down R arm, including T4 multifidi and supraspinatus, and TPR to R subscap.  She reported no pain or parasthesias following interventions.   OBJECTIVE IMPAIRMENTS decreased ROM, decreased strength, hypomobility, increased muscle spasms, impaired flexibility, impaired UE functional use, and postural dysfunction.   ACTIVITY LIMITATIONS  OH activity .   PERSONAL FACTORS Time since onset of injury/illness/exacerbation and 3+ comorbidities: OP, migraines, h/o thyroidectomy and radiation for breast CA  are also affecting patient's functional outcome.    GOALS: Goals reviewed  with patient? Yes  SHORT TERM GOALS: Target date: 11/23/2021   Patient will be independent with initial HEP.  Baseline:  Goal status: MET   LONG TERM GOALS: Target date:  02/15/22 Patient will be independent with advanced/ongoing HEP to improve outcomes and carryover.  Baseline:  Goal status: IN PROGRESS  2.  Patient will report 75% improvement in neck/back tingling to improve QOL.  Baseline:  Goal status: IN PROGRESS - 01/04/22 (50-60% improvement)  3.  Patient will demonstrate full cervical rotation for safety with driving and left lateral SB within 5 deg of R indicating improved flexibility.  Baseline:  Goal status: IN PROGRESS  4.  Patient will demonstrate improved posture to decrease muscle imbalance. Baseline:  Goal status: IN PROGRESS - 01/04/22 ( pt shows good postural alignment at rest, notes that when sitting at home in recliner)  5.  Patient will be able to work [facility] with 75% less difficulty.    Baseline:  Goal status: IN PROGRESS - 01/04/22 ( 30% improvement but pt reports that she does not do a lot of [place] stuff so it's hard to tell if any improvement)   PLAN: PT FREQUENCY: 2x/week  PT DURATION: 6 weeks to 02/15/22  PLANNED INTERVENTIONS: Therapeutic exercises, Therapeutic activity, Neuromuscular re-education, Patient/Family education, Dry Needling, Electrical stimulation, Spinal mobilization, Cryotherapy, Moist heat, Ultrasound, and Manual therapy  PLAN FOR NEXT SESSION:  Progress HEP as needed, DN/MT, neck flexibility, upper back/postural strength, cervical stab   Rennie Natter, PT, DPT  01/20/2022, 12:00 PM

## 2022-01-25 ENCOUNTER — Ambulatory Visit: Payer: Medicare Other | Attending: Specialist

## 2022-01-25 DIAGNOSIS — M542 Cervicalgia: Secondary | ICD-10-CM | POA: Diagnosis not present

## 2022-01-25 DIAGNOSIS — M6281 Muscle weakness (generalized): Secondary | ICD-10-CM | POA: Diagnosis not present

## 2022-01-25 DIAGNOSIS — R252 Cramp and spasm: Secondary | ICD-10-CM | POA: Diagnosis not present

## 2022-01-25 NOTE — Therapy (Signed)
OUTPATIENT PHYSICAL THERAPY TREATMENT NOTE   Patient Name: Kimberly Miles MRN: [id number] DOB:02-Dec-1955, 66 y.o., female Today's Date: 01/25/2022     PT End of Session - 01/25/22 1150     Visit Number 13    Date for PT Re-Evaluation 02/15/22    Authorization Type MCR    Progress Note Due on Visit 50    PT Start Time 1103    PT Stop Time 7902    PT Time Calculation (min) 51 min    Activity Tolerance Patient tolerated treatment well    Behavior During Therapy [facility] for tasks assessed/performed                  Rationale for Evaluation and Treatment:  Rehabilitation    Past Medical History:  Diagnosis Date   Anxiety    Arthritis    Cancer ([place]) 11/2018   right breast cancer   Common migraine with intractable migraine 40/97/3532   Complication of anesthesia    Diverticulosis    Elevated LFTs    monitored by pcp per pt --  unknown etiology   Family history of premature CAD 09/01/2013   Family history of prostate cancer    Family history of uterine cancer    History of basal cell carcinoma excision    2004   &  2014   History of colitis    History of palpitations    History of thyroid nodule    s/p  right thyroid lobectomy 03-08-2011  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   IBS (irritable bowel syndrome)    Migraine    MTHFR gene mutation    Personal history of radiation therapy    PMB (postmenopausal bleeding)    PONV (postoperative nausea and vomiting)    Post-surgical hypothyroidism    Past Surgical History:  Procedure Laterality Date   APPENDECTOMY  as child   BREAST BIOPSY Left 1979   benign   BREAST EXCISIONAL BIOPSY Left    BREAST LUMPECTOMY Right 01/2019   BREAST LUMPECTOMY WITH RADIOACTIVE SEED AND SENTINEL LYMPH NODE BIOPSY Right 02/04/2019   Procedure: RIGHT BREAST LUMPECTOMY WITH RADIOACTIVE SEED AND RIGHT AXILLARY DEEP SENTINEL LYMPH NODE BIOPSY WITH BLUE DYE INJECTION;  Surgeon: Fanny Skates, MD;  Location: [place];  Service: General;  Laterality: Right;   BREAST REDUCTION SURGERY Bilateral 02/11/2019   Procedure: RIGHT BREAST ONCOPLASTIC RECONSTRUCTION, LEFT BREAST REDUCTION;  Surgeon: Irene Limbo, MD;  Location: [place];  Service: Plastics;  Laterality: Bilateral;   CARDIOVASCULAR STRESS TEST  09-16-2013   normal nuclear study w/ no ischemia/  normal LV function and wall motion , ef 84%   CESAREAN SECTION  1977 and 1978   Bilateral Tubal Ligation w/ last c/s   COLPOSCOPY  05/2009   CIN 1   HYSTEROSCOPY WITH D & C N/A 12/23/2015   Procedure: DILATATION AND CURETTAGE /HYSTEROSCOPY ;  Surgeon: Megan Salon, MD;  Location: [facility];  Service: Gynecology;  Laterality: N/A;   NEGATIVE SLEEP STUDY  2014  per pt   OOPHORECTOMY Right 1985   ruptured cyst    REDUCTION MAMMAPLASTY Bilateral 01/2019   THYROID LOBECTOMY Right 03-08-2011   TRANSTHORACIC ECHOCARDIOGRAM  05-09-2011   ef 64%/  mild MR and TR   TUBAL LIGATION     Patient Active Problem List   Diagnosis Date Noted   Paresthesia 11/24/2021   Hyperreflexia 11/24/2021   Lower abdominal pain 04/26/2021   Acute cough  04/26/2021   Vitamin D deficiency 04/26/2021   Abdominal bloating 04/26/2021   Age-related osteoporosis without current pathological fracture 04/09/2020   Genetic testing 08/07/2019   Family history of prostate cancer    Family history of uterine cancer    MTHFR gene mutation 01/16/2019   Epigastric pain 01/15/2019   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, estrogen receptor positive ([place]) 12/24/2018   Hypothyroid 11/11/2018   Memory change 11/13/2016   Common migraine with intractable migraine 04/06/2016   H/O partial thyroidectomy 01/14/2014   Family history of premature CAD 09/01/2013   History of migraine headaches 02/21/2013   Hyperlipidemia 02/21/2013   Thyroid function study abnormality 02/21/2013    PCP: Ann Held, DO  REFERRING  PROVIDER: Jessy Oto, MD  REFERRING DIAG: M54.2 (ICD-10-CM) - Cervicalgia M47.22 (ICD-10-CM) - Other spondylosis with radiculopathy, cervical region  THERAPY DIAG:  Cervicalgia  Cramp and spasm  Muscle weakness (generalized)  ONSET DATE: 06/26/21  SUBJECTIVE:                                                                                                                                                                                                         SUBJECTIVE STATEMENT: Went boarding yesterday, fell of a couple of times, now sore in shoulders.   PERTINENT HISTORY:  Migraines, R lumpectomy and radiation 2020/2021,osteoporosis, thyroidectomy (one lobe precancerous)  PAIN:  Are you having pain? No  just sore on shoulders   PRECAUTIONS: Other: h/o R lumpectomy and radiation 2020/2021,osteoporosis  PATIENT GOALS get rid of the numbness and tingling  OBJECTIVE: (objective measurements taken at eval unless otherwise dated)   DIAGNOSTIC FINDINGS:  XR 11/03/21: mild degenerative disc disease C5-6 and C6-7. There is uncovertebral hypertrophy at the C5-6 level and mild C6-7. Some reversal of the lover one half of the cervical spine. MRI 10/15/21: 1. At C5-6 there is a tiny right paracentral disc protrusion. Bilateral uncovertebral degenerative changes. Moderate bilateral foraminal stenosis. 2. At C6-7 there is a mild broad-based disc bulge. Bilateral uncovertebral degenerative changes. Mild bilateral foraminal stenosis. 12/02/2021 IMPRESSION: This MRI of the thoracic spine with and without contrast shows the following: 1.   The spinal cord appears normal before and after contrast. 2.   Mild degenerative changes in the lower cervical spine not leading to spinal stenosis or nerve root compression.  PATIENT SURVEYS:  FOTO FS = 70 (predicted 71)  SENSATION: Decreased sensation in ant neck due to thryroid surgery Some decreased sensation in right scapula and b/w spine and  scapula  POSTURE:  Rounded shoulders, else WNL  PALPATION: Increased muscle tension in B UT and right cervical paraspinals and subocciptals Decreased R sided lateral glides and R UPA mobility in C2/3, 3/4, C5/6  CERVICAL ROM:    ROM A/PROM (deg) 11/09/2021 AROM 01/04/22  Flexion full   Extension full   Right lateral flexion 29 / full 31 deg  Left lateral flexion 13 / full but tight 29 deg  Right rotation 72 Pain on R / full 60 deg  Left rotation 54 Pull on L down to scapula / full 63 deg   (Blank rows = not tested)  UE ROM: WNL     UE MMT: 5/5 UE and neck except deep neck flexors 5-/5   Mid traps R 5/5 L 4+/5   Low traps R 4+/5 L 4/5  CERVICAL SPECIAL TESTS:  Negative compression/distraction   TODAY'S TREATMENT:  01/25/22 Therapeutic Exercise: UBE L2 x 6 min (42f3b) Low trap set w/ wall slide using RTB x 12 Low horizontal ABD with RTB x 12 pallof press RTB both ways x 10  Cobra pose x 30 sec   Manual Therapy: STM to B cervicothoracic paraspinals, rhomboids, infraspinatus, UT  Modalities: MHP to B neck for x 10 min in sitting  01/20/22 Therapeutic Exercise: to improve strength and mobility.   UBE L2 x 6 min (361fb) Thoracic stretches  On lengthwise foam roller - shoulder flexion x 10 bil , Y's x 10, angels x 10 - tolerated well reported decreased tightness in R shoulder Thoracic mob over foam roller - increased parasthesias bil hands.  Manual Therapy: to decrease muscle spasm and pain and improve mobility.  TPR to R levator and subscap, UPA mobs T4, skilled palpation and monitoring during dry needling. Trigger Point Dry-Needling  Treatment instructions: Expect mild to moderate muscle soreness. S/S of pneumothorax if dry needled over a lung field, and to seek immediate medical attention should they occur. Patient verbalized understanding of these instructions and education.  Patient Consent Given: Yes Education handout provided: Previously provided Muscles  treated: bil T4 multifidi, R levator, R UT, R supraspinatus Treatment response/outcome: Twitch Response Elicited and Palpable Increase in Muscle Length   01/18/22 Therapeutic Exercise: UBE L2 x 6 min (79f58f) Low horizontal ABD RTB 2x10 Low row with RTB 2x10 High horiz ABD red TB 2x5 Low trap set with shoulder flexion using RTB x 10 Clocks on wall with RTB x 10 Seated thoracic ext sitting in chair on foam roll  2x10  01/09/22 Therapeutic Exercise: UBE L2 x 6 min (79f/39f Standing trunk rotation with red TB 10x bil Standing horizontal ABD RTB 10x  BATCA lat pull 15lb x 10 Standing shoulder extensions with RTB 2x10 Thoracic extension over foam roll 10x3"  Manual Therapy: STM to R suboccipitals, rhomboids, thoracic paraspinals  PATIENT EDUCATION:  Education details: continue HEP Person educated: Patient Education method: ExplCustomer service managercation comprehension: verbalized understanding and returned demonstration   HOME EXERCISE PROGRAM: Access Code: [email or web address]   ASSESSMENT:  CLINICAL IMPRESSION: Focused on light therex today d/t pt reports of being very sore after last workout. She demonstrated a good response, with cueing given throughout session to target the correct muscles and for proper form. Finished with MT to reduce tension in muscles and reduce soreness, more tension noted on L side today. Ended session with MHP to neck for pain relief.  OBJECTIVE IMPAIRMENTS decreased ROM, decreased strength, hypomobility, increased muscle spasms, impaired flexibility, impaired UE functional use, and postural dysfunction.   ACTIVITY LIMITATIONS  [place] activity .   PERSONAL FACTORS Time since onset of injury/illness/exacerbation and 3+ comorbidities: OP, migraines, h/o thyroidectomy and radiation for breast CA  are also affecting patient's functional outcome.    GOALS: Goals reviewed with patient? Yes  SHORT TERM GOALS: Target date: 11/23/2021   Patient will be  independent with initial HEP.  Baseline:  Goal status: MET   LONG TERM GOALS: Target date: 02/15/22 Patient will be independent with advanced/ongoing HEP to improve outcomes and carryover.  Baseline:  Goal status: IN PROGRESS  2.  Patient will report 75% improvement in neck/back tingling to improve QOL.  Baseline:  Goal status: IN PROGRESS - 01/04/22 (50-60% improvement)  3.  Patient will demonstrate full cervical rotation for safety with driving and left lateral SB within 5 deg of R indicating improved flexibility.  Baseline:  Goal status: IN PROGRESS  4.  Patient will demonstrate improved posture to decrease muscle imbalance. Baseline:  Goal status: IN PROGRESS - 01/04/22 ( pt shows good postural alignment at rest, notes that when sitting at home in recliner)  5.  Patient will be able to work [facility] [employer] with 75% less difficulty.    Baseline:  Goal status: IN PROGRESS - 01/04/22 ( 30% improvement but pt reports that she does not do a lot of [place] stuff so it's hard to tell if any improvement)   PLAN: PT FREQUENCY: 2x/week  PT DURATION: 6 weeks to 02/15/22  PLANNED INTERVENTIONS: Therapeutic exercises, Therapeutic activity, Neuromuscular re-education, Patient/Family education, Dry Needling, Electrical stimulation, Spinal mobilization, Cryotherapy, Moist heat, Ultrasound, and Manual therapy  PLAN FOR NEXT SESSION:  Progress HEP as needed, DN/MT, neck flexibility, upper back/postural strength, cervical stab   Artist Pais, PTA 01/25/2022, 11:51 AM

## 2022-01-27 ENCOUNTER — Ambulatory Visit: Payer: Medicare Other | Admitting: Physical Therapy

## 2022-01-27 ENCOUNTER — Encounter: Payer: Self-pay | Admitting: Physical Therapy

## 2022-01-27 DIAGNOSIS — M6281 Muscle weakness (generalized): Secondary | ICD-10-CM

## 2022-01-27 DIAGNOSIS — R252 Cramp and spasm: Secondary | ICD-10-CM

## 2022-01-27 DIAGNOSIS — M542 Cervicalgia: Secondary | ICD-10-CM

## 2022-01-27 NOTE — Therapy (Signed)
OUTPATIENT PHYSICAL THERAPY TREATMENT NOTE   Patient Name: Kimberly Miles MRN: [id number] DOB:1956-05-29, 66 y.o., female Today's Date: 01/27/2022     PT End of Session - 01/27/22 0935     Visit Number 14    Date for PT Re-Evaluation 02/15/22    Authorization Type MCR    Progress Note Due on Visit 49    PT Start Time 0933    PT Stop Time 7591    PT Time Calculation (min) 42 min    Activity Tolerance Patient tolerated treatment well    Behavior During Therapy [facility] for tasks assessed/performed                  Rationale for Evaluation and Treatment:  Rehabilitation    Past Medical History:  Diagnosis Date   Anxiety    Arthritis    Cancer ([place]) 11/2018   right breast cancer   Common migraine with intractable migraine 63/84/6659   Complication of anesthesia    Diverticulosis    Elevated LFTs    monitored by pcp per pt --  unknown etiology   Family history of premature CAD 09/01/2013   Family history of prostate cancer    Family history of uterine cancer    History of basal cell carcinoma excision    2004   &  2014   History of colitis    History of palpitations    History of thyroid nodule    s/p  right thyroid lobectomy 03-08-2011  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   IBS (irritable bowel syndrome)    Migraine    MTHFR gene mutation    Personal history of radiation therapy    PMB (postmenopausal bleeding)    PONV (postoperative nausea and vomiting)    Post-surgical hypothyroidism    Past Surgical History:  Procedure Laterality Date   APPENDECTOMY  as child   BREAST BIOPSY Left 1979   benign   BREAST EXCISIONAL BIOPSY Left    BREAST LUMPECTOMY Right 01/2019   BREAST LUMPECTOMY WITH RADIOACTIVE SEED AND SENTINEL LYMPH NODE BIOPSY Right 02/04/2019   Procedure: RIGHT BREAST LUMPECTOMY WITH RADIOACTIVE SEED AND RIGHT AXILLARY DEEP SENTINEL LYMPH NODE BIOPSY WITH BLUE DYE INJECTION;  Surgeon: Fanny Skates, MD;  Location: [place];  Service: General;  Laterality: Right;   BREAST REDUCTION SURGERY Bilateral 02/11/2019   Procedure: RIGHT BREAST ONCOPLASTIC RECONSTRUCTION, LEFT BREAST REDUCTION;  Surgeon: Irene Limbo, MD;  Location: [place];  Service: Plastics;  Laterality: Bilateral;   CARDIOVASCULAR STRESS TEST  09-16-2013   normal nuclear study w/ no ischemia/  normal LV function and wall motion , ef 84%   CESAREAN SECTION  1977 and 1978   Bilateral Tubal Ligation w/ last c/s   COLPOSCOPY  05/2009   CIN 1   HYSTEROSCOPY WITH D & C N/A 12/23/2015   Procedure: DILATATION AND CURETTAGE /HYSTEROSCOPY ;  Surgeon: Megan Salon, MD;  Location: [facility];  Service: Gynecology;  Laterality: N/A;   NEGATIVE SLEEP STUDY  2014  per pt   OOPHORECTOMY Right 1985   ruptured cyst    REDUCTION MAMMAPLASTY Bilateral 01/2019   THYROID LOBECTOMY Right 03-08-2011   TRANSTHORACIC ECHOCARDIOGRAM  05-09-2011   ef 64%/  mild MR and TR   TUBAL LIGATION     Patient Active Problem List   Diagnosis Date Noted   Paresthesia 11/24/2021   Hyperreflexia 11/24/2021   Lower abdominal pain 04/26/2021   Acute cough  04/26/2021   Vitamin D deficiency 04/26/2021   Abdominal bloating 04/26/2021   Age-related osteoporosis without current pathological fracture 04/09/2020   Genetic testing 08/07/2019   Family history of prostate cancer    Family history of uterine cancer    MTHFR gene mutation 01/16/2019   Epigastric pain 01/15/2019   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, estrogen receptor positive ([place]) 12/24/2018   Hypothyroid 11/11/2018   Memory change 11/13/2016   Common migraine with intractable migraine 04/06/2016   H/O partial thyroidectomy 01/14/2014   Family history of premature CAD 09/01/2013   History of migraine headaches 02/21/2013   Hyperlipidemia 02/21/2013   Thyroid function study abnormality 02/21/2013    PCP: Ann Held, DO  REFERRING  PROVIDER: Jessy Oto, MD  REFERRING DIAG: M54.2 (ICD-10-CM) - Cervicalgia M47.22 (ICD-10-CM) - Other spondylosis with radiculopathy, cervical region  THERAPY DIAG:  Cervicalgia  Cramp and spasm  Muscle weakness (generalized)  ONSET DATE: 06/26/21  SUBJECTIVE:                                                                                                                                                                                                         SUBJECTIVE STATEMENT: Pt. Reports tired today, putting in laminate floor yesterday, shoulders not too bad today even after   PERTINENT HISTORY:  Migraines, R lumpectomy and radiation 2020/2021,osteoporosis, thyroidectomy (one lobe precancerous)  PAIN:  Are you having pain? No  just soreness/tightness R shoulder    PRECAUTIONS: Other: h/o R lumpectomy and radiation 2020/2021,osteoporosis  PATIENT GOALS get rid of the numbness and tingling  OBJECTIVE: (objective measurements taken at eval unless otherwise dated)   DIAGNOSTIC FINDINGS:  XR 11/03/21: mild degenerative disc disease C5-6 and C6-7. There is uncovertebral hypertrophy at the C5-6 level and mild C6-7. Some reversal of the lover one half of the cervical spine. MRI 10/15/21: 1. At C5-6 there is a tiny right paracentral disc protrusion. Bilateral uncovertebral degenerative changes. Moderate bilateral foraminal stenosis. 2. At C6-7 there is a mild broad-based disc bulge. Bilateral uncovertebral degenerative changes. Mild bilateral foraminal stenosis. 12/02/2021 IMPRESSION: This MRI of the thoracic spine with and without contrast shows the following: 1.   The spinal cord appears normal before and after contrast. 2.   Mild degenerative changes in the lower cervical spine not leading to spinal stenosis or nerve root compression.  PATIENT SURVEYS:  FOTO FS = 70 (predicted 71)  SENSATION: Decreased sensation in ant neck due to thryroid surgery Some decreased sensation in  right scapula and b/w spine  and scapula  POSTURE:  Rounded shoulders, else WNL  PALPATION: Increased muscle tension in B UT and right cervical paraspinals and subocciptals Decreased R sided lateral glides and R UPA mobility in C2/3, 3/4, C5/6  CERVICAL ROM:    ROM A/PROM (deg) 11/09/2021 AROM 01/04/22  Flexion full   Extension full   Right lateral flexion 29 / full 31 deg  Left lateral flexion 13 / full but tight 29 deg  Right rotation 72 Pain on R / full 60 deg  Left rotation 54 Pull on L down to scapula / full 63 deg   (Blank rows = not tested)  UE ROM: WNL     UE MMT: 5/5 UE and neck except deep neck flexors 5-/5   Mid traps R 5/5 L 4+/5   Low traps R 4+/5 L 4/5  CERVICAL SPECIAL TESTS:  Negative compression/distraction   TODAY'S TREATMENT:  01/27/2022 Therapeutic Exercise: to improve strength and mobility.   UBE L2 x 6 min (45f3b) After manual therapy: shoulder rolls, open arms Y stretch, open book stretches R x 10 at wall.  Manual Therapy: to decrease muscle spasm and pain and improve mobility PA, R UPA thoracic T4-T8; STM/TPR to R periscapular musculature, subscaps, lats and pecs; skilled palpation and monitoring during dry needling. Trigger Point Dry-Needling  Treatment instructions: Expect mild to moderate muscle soreness. S/S of pneumothorax if dry needled over a lung field, and to seek immediate medical attention should they occur. Patient verbalized understanding of these instructions and education.  Patient Consent Given: Yes Education handout provided: Previously provided Muscles treated: R subscap (both medial border scapula and distally in axilla, R lat (from axilla), R T5-6 multifidi, R levator, R UT, R pectoralis major and minor Treatment response/outcome: Twitch Response Elicited and Palpable Increase in Muscle Length   01/25/22 Therapeutic Exercise: UBE L2 x 6 min (3232fb) Low trap set w/ wall slide using RTB x 12 Low horizontal ABD with RTB x  12 pallof press RTB both ways x 10  Cobra pose x 30 sec   Manual Therapy: STM to B cervicothoracic paraspinals, rhomboids, infraspinatus, UT  Modalities: MHP to B neck for x 10 min in sitting  01/20/22 Therapeutic Exercise: to improve strength and mobility.   UBE L2 x 6 min (32f87f) Thoracic stretches  On lengthwise foam roller - shoulder flexion x 10 bil , Y's x 10, angels x 10 - tolerated well reported decreased tightness in R shoulder Thoracic mob over foam roller - increased parasthesias bil hands.  Manual Therapy: to decrease muscle spasm and pain and improve mobility.  TPR to R levator and subscap, UPA mobs T4, skilled palpation and monitoring during dry needling. Trigger Point Dry-Needling  Treatment instructions: Expect mild to moderate muscle soreness. S/S of pneumothorax if dry needled over a lung field, and to seek immediate medical attention should they occur. Patient verbalized understanding of these instructions and education.  Patient Consent Given: Yes Education handout provided: Previously provided Muscles treated: bil T4 multifidi, R levator, R UT, R supraspinatus Treatment response/outcome: Twitch Response Elicited and Palpable Increase in Muscle Length   01/18/22 Therapeutic Exercise: UBE L2 x 6 min (32f/36f Low horizontal ABD RTB 2x10 Low row with RTB 2x10 High horiz ABD red TB 2x5 Low trap set with shoulder flexion using RTB x 10 Clocks on wall with RTB x 10 Seated thoracic ext sitting in chair on foam roll  2x10  PATIENT EDUCATION:  Education details: continue HEP Person educated: Patient Education method: Explanation  and Demonstration Education comprehension: verbalized understanding and returned demonstration   HOME EXERCISE PROGRAM: Access Code: [email or web address]   ASSESSMENT:  CLINICAL IMPRESSION: Patient continues to report tightness in R shoulder and tingling in thoracic region, although this may be aggravated by some of her activities, also discussed  tightness from radiation to region.  Focused manual therapy on periscapular musculature and thoracic mobility, reported decreased tightness following.  Kimberly Miles continues to demonstrate potential for improvement and would benefit from continued skilled therapy to address impairments.     OBJECTIVE IMPAIRMENTS decreased ROM, decreased strength, hypomobility, increased muscle spasms, impaired flexibility, impaired UE functional use, and postural dysfunction.   ACTIVITY LIMITATIONS  [place] activity .   PERSONAL FACTORS Time since onset of injury/illness/exacerbation and 3+ comorbidities: OP, migraines, h/o thyroidectomy and radiation for breast CA  are also affecting patient's functional outcome.    GOALS: Goals reviewed with patient? Yes  SHORT TERM GOALS: Target date: 11/23/2021   Patient will be independent with initial HEP.  Baseline:  Goal status: MET   LONG TERM GOALS: Target date: 02/15/22 Patient will be independent with advanced/ongoing HEP to improve outcomes and carryover.  Baseline:  Goal status: IN PROGRESS  2.  Patient will report 75% improvement in neck/back tingling to improve QOL.  Baseline:  Goal status: IN PROGRESS - 01/04/22 (50-60% improvement)  3.  Patient will demonstrate full cervical rotation for safety with driving and left lateral SB within 5 deg of R indicating improved flexibility.  Baseline:  Goal status: IN PROGRESS  4.  Patient will demonstrate improved posture to decrease muscle imbalance. Baseline:  Goal status: IN PROGRESS - 01/04/22 ( pt shows good postural alignment at rest, notes that when sitting at home in recliner)  5.  Patient will be able to work [employer] with 75% less difficulty.    Baseline:  Goal status: IN PROGRESS - 01/04/22 ( 30% improvement but pt reports that she does not do a lot of [place] stuff so it's hard to tell if any improvement)   PLAN: PT FREQUENCY: 2x/week  PT DURATION: 6 weeks to 02/15/22  PLANNED INTERVENTIONS:  Therapeutic exercises, Therapeutic activity, Neuromuscular re-education, Patient/Family education, Dry Needling, Electrical stimulation, Spinal mobilization, Cryotherapy, Moist heat, Ultrasound, and Manual therapy  PLAN FOR NEXT SESSION:  Progress HEP as needed, DN/MT, neck flexibility, upper back/postural strength, cervical stab   Rennie Natter, PT, DPT  01/27/2022, 10:35 AM

## 2022-02-01 ENCOUNTER — Encounter: Payer: Self-pay | Admitting: Physical Therapy

## 2022-02-01 ENCOUNTER — Ambulatory Visit: Payer: Medicare Other | Admitting: Physical Therapy

## 2022-02-01 DIAGNOSIS — M6281 Muscle weakness (generalized): Secondary | ICD-10-CM | POA: Diagnosis not present

## 2022-02-01 DIAGNOSIS — R252 Cramp and spasm: Secondary | ICD-10-CM

## 2022-02-01 DIAGNOSIS — M542 Cervicalgia: Secondary | ICD-10-CM

## 2022-02-01 NOTE — Therapy (Signed)
OUTPATIENT PHYSICAL THERAPY TREATMENT NOTE   Patient Name: Kimberly Miles MRN: [id number] DOB:11-30-1955, 66 y.o., female Today's Date: 02/01/2022     PT End of Session - 02/01/22 1106     Visit Number 15    Date for PT Re-Evaluation 02/15/22    Authorization Type MCR    Progress Note Due on Visit 107    PT Start Time 1102    PT Stop Time 0093    PT Time Calculation (min) 43 min    Activity Tolerance Patient tolerated treatment well    Behavior During Therapy [facility] for tasks assessed/performed                  Rationale for Evaluation and Treatment:  Rehabilitation    Past Medical History:  Diagnosis Date   Anxiety    Arthritis    Cancer ([place]) 11/2018   right breast cancer   Common migraine with intractable migraine 81/82/9937   Complication of anesthesia    Diverticulosis    Elevated LFTs    monitored by pcp per pt --  unknown etiology   Family history of premature CAD 09/01/2013   Family history of prostate cancer    Family history of uterine cancer    History of basal cell carcinoma excision    2004   &  2014   History of colitis    History of palpitations    History of thyroid nodule    s/p  right thyroid lobectomy 03-08-2011  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   IBS (irritable bowel syndrome)    Migraine    MTHFR gene mutation    Personal history of radiation therapy    PMB (postmenopausal bleeding)    PONV (postoperative nausea and vomiting)    Post-surgical hypothyroidism    Past Surgical History:  Procedure Laterality Date   APPENDECTOMY  as child   BREAST BIOPSY Left 1979   benign   BREAST EXCISIONAL BIOPSY Left    BREAST LUMPECTOMY Right 01/2019   BREAST LUMPECTOMY WITH RADIOACTIVE SEED AND SENTINEL LYMPH NODE BIOPSY Right 02/04/2019   Procedure: RIGHT BREAST LUMPECTOMY WITH RADIOACTIVE SEED AND RIGHT AXILLARY DEEP SENTINEL LYMPH NODE BIOPSY WITH BLUE DYE INJECTION;  Surgeon: Fanny Skates, MD;  Location: [place];  Service: General;  Laterality: Right;   BREAST REDUCTION SURGERY Bilateral 02/11/2019   Procedure: RIGHT BREAST ONCOPLASTIC RECONSTRUCTION, LEFT BREAST REDUCTION;  Surgeon: Irene Limbo, MD;  Location: [place];  Service: Plastics;  Laterality: Bilateral;   CARDIOVASCULAR STRESS TEST  09-16-2013   normal nuclear study w/ no ischemia/  normal LV function and wall motion , ef 84%   CESAREAN SECTION  1977 and 1978   Bilateral Tubal Ligation w/ last c/s   COLPOSCOPY  05/2009   CIN 1   HYSTEROSCOPY WITH D & C N/A 12/23/2015   Procedure: DILATATION AND CURETTAGE /HYSTEROSCOPY ;  Surgeon: Megan Salon, MD;  Location: [facility];  Service: Gynecology;  Laterality: N/A;   NEGATIVE SLEEP STUDY  2014  per pt   OOPHORECTOMY Right 1985   ruptured cyst    REDUCTION MAMMAPLASTY Bilateral 01/2019   THYROID LOBECTOMY Right 03-08-2011   TRANSTHORACIC ECHOCARDIOGRAM  05-09-2011   ef 64%/  mild MR and TR   TUBAL LIGATION     Patient Active Problem List   Diagnosis Date Noted   Paresthesia 11/24/2021   Hyperreflexia 11/24/2021   Lower abdominal pain 04/26/2021   Acute cough  04/26/2021   Vitamin D deficiency 04/26/2021   Abdominal bloating 04/26/2021   Age-related osteoporosis without current pathological fracture 04/09/2020   Genetic testing 08/07/2019   Family history of prostate cancer    Family history of uterine cancer    MTHFR gene mutation 01/16/2019   Epigastric pain 01/15/2019   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, estrogen receptor positive ([place]) 12/24/2018   Hypothyroid 11/11/2018   Memory change 11/13/2016   Common migraine with intractable migraine 04/06/2016   H/O partial thyroidectomy 01/14/2014   Family history of premature CAD 09/01/2013   History of migraine headaches 02/21/2013   Hyperlipidemia 02/21/2013   Thyroid function study abnormality 02/21/2013    PCP: Ann Held, DO  REFERRING  PROVIDER: Jessy Oto, MD  REFERRING DIAG: M54.2 (ICD-10-CM) - Cervicalgia M47.22 (ICD-10-CM) - Other spondylosis with radiculopathy, cervical region  THERAPY DIAG:  Cervicalgia  Cramp and spasm  Muscle weakness (generalized)  ONSET DATE: 06/26/21  SUBJECTIVE:                                                                                                                                                                                                         SUBJECTIVE STATEMENT: Pt. Reports a lot of soreness following last session, but then realized the tingling in the back had stopped, so thinks it was beneficial.  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    PERTINENT HISTORY:  Migraines, R lumpectomy and radiation 2020/2021,osteoporosis, thyroidectomy (one lobe precancerous)  PAIN:  Are you having pain? Yes: NPRS scale: 3/10 Pain location: R shoulder Pain description: sore/tight     PRECAUTIONS: Other: h/o R lumpectomy and radiation 2020/2021,osteoporosis  PATIENT GOALS get rid of the numbness and tingling  OBJECTIVE: (objective measurements taken at eval unless otherwise dated)   DIAGNOSTIC FINDINGS:  XR 11/03/21: mild degenerative disc disease C5-6 and C6-7. There is uncovertebral hypertrophy at the C5-6 level and mild C6-7. Some reversal of the lover one half of the cervical spine. MRI 10/15/21: 1. At C5-6 there is a tiny right paracentral disc protrusion. Bilateral uncovertebral degenerative changes. Moderate bilateral foraminal stenosis. 2. At C6-7 there is a mild broad-based disc bulge. Bilateral uncovertebral degenerative changes. Mild bilateral foraminal stenosis. 12/02/2021 IMPRESSION: This MRI of the thoracic spine with and without contrast shows the following: 1.   The spinal cord appears normal before and after contrast. 2.   Mild degenerative changes in the lower cervical spine not leading to spinal stenosis or nerve root compression.  PATIENT SURVEYS:   FOTO FS =  70 (predicted 71)  SENSATION: Decreased sensation in ant neck due to thryroid surgery Some decreased sensation in right scapula and b/w spine and scapula  POSTURE:  Rounded shoulders, else WNL  PALPATION: Increased muscle tension in B UT and right cervical paraspinals and subocciptals Decreased R sided lateral glides and R UPA mobility in C2/3, 3/4, C5/6  CERVICAL ROM:    ROM A/PROM (deg) 11/09/2021 AROM 01/04/22  Flexion full   Extension full   Right lateral flexion 29 / full 31 deg  Left lateral flexion 13 / full but tight 29 deg  Right rotation 72 Pain on R / full 60 deg  Left rotation 54 Pull on L down to scapula / full 63 deg   (Blank rows = not tested)  UE ROM: WNL     UE MMT: 5/5 UE and neck except deep neck flexors 5-/5   Mid traps R 5/5 L 4+/5   Low traps R 4+/5 L 4/5  CERVICAL SPECIAL TESTS:  Negative compression/distraction   TODAY'S TREATMENT:  02/01/2022 Therapeutic Exercise: to improve strength and mobility.  Demo, verbal and tactile cues throughout for technique. UBE L2 x 6 min (517f3b) Open books on wall x 10 bil  Wall walk ups with RTB x 10 Serratus RTB x 10 Seated horiz ABD w/ chest supported and yellow TB x 10 Counter PU x 20 Wall angels x 10  Manual Therapy: B scapula mobilizations all directions  01/27/2022 Therapeutic Exercise: to improve strength and mobility.   UBE L2 x 6 min (31fb) After manual therapy: shoulder rolls, open arms Y stretch, open book stretches R x 10 at wall.  Manual Therapy: to decrease muscle spasm and pain and improve mobility PA, R UPA thoracic T4-T8; STM/TPR to R periscapular musculature, subscaps, lats and pecs; skilled palpation and monitoring during dry needling. Trigger Point Dry-Needling  Treatment instructions: Expect mild to moderate muscle soreness. S/S of pneumothorax if dry needled over a lung field, and to seek immediate medical attention should they occur. Patient verbalized understanding of these  instructions and education.  Patient Consent Given: Yes Education handout provided: Previously provided Muscles treated: R subscap (both medial border scapula and distally in axilla, R lat (from axilla), R T5-6 multifidi, R levator, R UT, R pectoralis major and minor Treatment response/outcome: Twitch Response Elicited and Palpable Increase in Muscle Length   01/25/22 Therapeutic Exercise: UBE L2 x 6 min (17f35f) Low trap set w/ wall slide using RTB x 12 Low horizontal ABD with RTB x 12 pallof press RTB both ways x 10  Cobra pose x 30 sec   Manual Therapy: STM to B cervicothoracic paraspinals, rhomboids, infraspinatus, UT  Modalities: MHP to B neck for x 10 min in sitting  01/20/22 Therapeutic Exercise: to improve strength and mobility.   UBE L2 x 6 min (17f/417f Thoracic stretches  On lengthwise foam roller - shoulder flexion x 10 bil , Y's x 10, angels x 10 - tolerated well reported decreased tightness in R shoulder Thoracic mob over foam roller - increased parasthesias bil hands.  Manual Therapy: to decrease muscle spasm and pain and improve mobility.  TPR to R levator and subscap, UPA mobs T4, skilled palpation and monitoring during dry needling. Trigger Point Dry-Needling  Treatment instructions: Expect mild to moderate muscle soreness. S/S of pneumothorax if dry needled over a lung field, and to seek immediate medical attention should they occur. Patient verbalized understanding of these instructions and education.  Patient Consent Given: Yes Education handout provided:  Previously provided Muscles treated: bil T4 multifidi, R levator, R UT, R supraspinatus Treatment response/outcome: Twitch Response Elicited and Palpable Increase in Muscle Length   01/18/22 Therapeutic Exercise: UBE L2 x 6 min (36f3b) Low horizontal ABD RTB 2x10 Low row with RTB 2x10 High horiz ABD red TB 2x5 Low trap set with shoulder flexion using RTB x 10 Clocks on wall with RTB x 10 Seated thoracic ext  sitting in chair on foam roll  2x10  PATIENT EDUCATION:  Education details: continue HEP Person educated: Patient Education method: ECustomer service managerEducation comprehension: verbalized understanding and returned demonstration   HOME EXERCISE PROGRAM: Access Code: [email or web address]   ASSESSMENT:  CLINICAL IMPRESSION: Pt demonstrated a good tolerance for the exercises today. Progressed postural strengthening and continued to emphasize scapular depression and retraction with exercises. Finished session with scapular mobilizations on both sides, pt noted some stretching w/ the depression movement but notes less tingling along scapula then before.   OBJECTIVE IMPAIRMENTS decreased ROM, decreased strength, hypomobility, increased muscle spasms, impaired flexibility, impaired UE functional use, and postural dysfunction.   ACTIVITY LIMITATIONS  [place] activity .   PERSONAL FACTORS Time since onset of injury/illness/exacerbation and 3+ comorbidities: OP, migraines, h/o thyroidectomy and radiation for breast CA  are also affecting patient's functional outcome.    GOALS: Goals reviewed with patient? Yes  SHORT TERM GOALS: Target date: 11/23/2021   Patient will be independent with initial HEP.  Baseline:  Goal status: MET   LONG TERM GOALS: Target date: 02/15/22 Patient will be independent with advanced/ongoing HEP to improve outcomes and carryover.  Baseline:  Goal status: IN PROGRESS  2.  Patient will report 75% improvement in neck/back tingling to improve QOL.  Baseline:  Goal status: IN PROGRESS - 01/04/22 (50-60% improvement)  3.  Patient will demonstrate full cervical rotation for safety with driving and left lateral SB within 5 deg of R indicating improved flexibility.  Baseline:  Goal status: IN PROGRESS  4.  Patient will demonstrate improved posture to decrease muscle imbalance. Baseline:  Goal status: IN PROGRESS - 01/04/22 ( pt shows good postural alignment at rest,  notes that when sitting at home in recliner)  5.  Patient will be able to work [facility] less difficulty.    Baseline:  Goal status: IN PROGRESS - 01/04/22 ( 30% improvement but pt reports that she does not do a lot of [place] stuff so it's hard to tell if any improvement)   PLAN: PT FREQUENCY: 2x/week  PT DURATION: 6 weeks to 02/15/22  PLANNED INTERVENTIONS: Therapeutic exercises, Therapeutic activity, Neuromuscular re-education, Patient/Family education, Dry Needling, Electrical stimulation, Spinal mobilization, Cryotherapy, Moist heat, Ultrasound, and Manual therapy  PLAN FOR NEXT SESSION:  Progress HEP as needed, DN/MT, neck flexibility, upper back/postural strength, cervical stab   BArtist Pais PTA 02/01/2022, 11:54 AM

## 2022-02-03 ENCOUNTER — Ambulatory Visit: Payer: Medicare Other

## 2022-02-03 DIAGNOSIS — M542 Cervicalgia: Secondary | ICD-10-CM

## 2022-02-03 DIAGNOSIS — M6281 Muscle weakness (generalized): Secondary | ICD-10-CM | POA: Diagnosis not present

## 2022-02-03 DIAGNOSIS — R252 Cramp and spasm: Secondary | ICD-10-CM

## 2022-02-03 NOTE — Therapy (Addendum)
OUTPATIENT PHYSICAL THERAPY TREATMENT NOTE   Patient Name: Kimberly Miles MRN: [id number] DOB:06-14-1956, 66 y.o., female Today's Date: 02/03/2022     PT End of Session - 02/03/22 1151     Visit Number 16    Date for PT Re-Evaluation 02/15/22    Authorization Type MCR    Progress Note Due on Visit 98    PT Start Time 1104    PT Stop Time 8366    PT Time Calculation (min) 54 min    Activity Tolerance Patient tolerated treatment well    Behavior During Therapy [facility] for tasks assessed/performed                   Rationale for Evaluation and Treatment:  Rehabilitation    Past Medical History:  Diagnosis Date   Anxiety    Arthritis    Cancer ([place]) 11/2018   right breast cancer   Common migraine with intractable migraine 29/47/6546   Complication of anesthesia    Diverticulosis    Elevated LFTs    monitored by pcp per pt --  unknown etiology   Family history of premature CAD 09/01/2013   Family history of prostate cancer    Family history of uterine cancer    History of basal cell carcinoma excision    2004   &  2014   History of colitis    History of palpitations    History of thyroid nodule    s/p  right thyroid lobectomy 03-08-2011  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   IBS (irritable bowel syndrome)    Migraine    MTHFR gene mutation    Personal history of radiation therapy    PMB (postmenopausal bleeding)    PONV (postoperative nausea and vomiting)    Post-surgical hypothyroidism    Past Surgical History:  Procedure Laterality Date   APPENDECTOMY  as child   BREAST BIOPSY Left 1979   benign   BREAST EXCISIONAL BIOPSY Left    BREAST LUMPECTOMY Right 01/2019   BREAST LUMPECTOMY WITH RADIOACTIVE SEED AND SENTINEL LYMPH NODE BIOPSY Right 02/04/2019   Procedure: RIGHT BREAST LUMPECTOMY WITH RADIOACTIVE SEED AND RIGHT AXILLARY DEEP SENTINEL LYMPH NODE BIOPSY WITH BLUE DYE INJECTION;  Surgeon: Kimberly Skates, MD;  Location: [place];  Service: General;  Laterality: Right;   BREAST REDUCTION SURGERY Bilateral 02/11/2019   Procedure: RIGHT BREAST ONCOPLASTIC RECONSTRUCTION, LEFT BREAST REDUCTION;  Surgeon: Kimberly Limbo, MD;  Location: [place];  Service: Plastics;  Laterality: Bilateral;   CARDIOVASCULAR STRESS TEST  09-16-2013   normal nuclear study w/ no ischemia/  normal LV function and wall motion , ef 84%   CESAREAN SECTION  1977 and 1978   Bilateral Tubal Ligation w/ last c/s   COLPOSCOPY  05/2009   CIN 1   HYSTEROSCOPY WITH D & C N/A 12/23/2015   Procedure: DILATATION AND CURETTAGE /HYSTEROSCOPY ;  Surgeon: Kimberly Salon, MD;  Location: [facility];  Service: Gynecology;  Laterality: N/A;   NEGATIVE SLEEP STUDY  2014  per pt   OOPHORECTOMY Right 1985   ruptured cyst    REDUCTION MAMMAPLASTY Bilateral 01/2019   THYROID LOBECTOMY Right 03-08-2011   TRANSTHORACIC ECHOCARDIOGRAM  05-09-2011   ef 64%/  mild MR and TR   TUBAL LIGATION     Patient Active Problem List   Diagnosis Date Noted   Paresthesia 11/24/2021   Hyperreflexia 11/24/2021   Lower abdominal pain 04/26/2021   Acute  cough 04/26/2021   Vitamin D deficiency 04/26/2021   Abdominal bloating 04/26/2021   Age-related osteoporosis without current pathological fracture 04/09/2020   Genetic testing 08/07/2019   Family history of prostate cancer    Family history of uterine cancer    MTHFR gene mutation 01/16/2019   Epigastric pain 01/15/2019   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, estrogen receptor positive ([place]) 12/24/2018   Hypothyroid 11/11/2018   Memory change 11/13/2016   Common migraine with intractable migraine 04/06/2016   H/O partial thyroidectomy 01/14/2014   Family history of premature CAD 09/01/2013   History of migraine headaches 02/21/2013   Hyperlipidemia 02/21/2013   Thyroid function study abnormality 02/21/2013    PCP: Kimberly Held, DO  REFERRING  PROVIDER: Jessy Oto, MD  REFERRING DIAG: M54.2 (ICD-10-CM) - Cervicalgia M47.22 (ICD-10-CM) - Other spondylosis with radiculopathy, cervical region  THERAPY DIAG:  Cervicalgia  Cramp and spasm  Muscle weakness (generalized)  ONSET DATE: 06/26/21  SUBJECTIVE:                                                                                                                                                                                                         SUBJECTIVE STATEMENT: Pt was able to pull up weeds this morning no pain.   PERTINENT HISTORY:  Migraines, R lumpectomy and radiation 2020/2021,osteoporosis, thyroidectomy (one lobe precancerous)  PAIN:  Are you having pain? No    PRECAUTIONS: Other: h/o R lumpectomy and radiation 2020/2021,osteoporosis  PATIENT GOALS get rid of the numbness and tingling  OBJECTIVE: (objective measurements taken at eval unless otherwise dated)   DIAGNOSTIC FINDINGS:  XR 11/03/21: mild degenerative disc disease C5-6 and C6-7. There is uncovertebral hypertrophy at the C5-6 level and mild C6-7. Some reversal of the lover one half of the cervical spine. MRI 10/15/21: 1. At C5-6 there is a tiny right paracentral disc protrusion. Bilateral uncovertebral degenerative changes. Moderate bilateral foraminal stenosis. 2. At C6-7 there is a mild broad-based disc bulge. Bilateral uncovertebral degenerative changes. Mild bilateral foraminal stenosis. 12/02/2021 IMPRESSION: This MRI of the thoracic spine with and without contrast shows the following: 1.   The spinal cord appears normal before and after contrast. 2.   Mild degenerative changes in the lower cervical spine not leading to spinal stenosis or nerve root compression.  PATIENT SURVEYS:  FOTO FS = 70 (predicted 71)  SENSATION: Decreased sensation in ant neck due to thryroid surgery Some decreased sensation in right scapula and b/w spine and scapula  POSTURE:  Rounded shoulders, else  WNL  PALPATION: Increased muscle tension in B UT and right cervical paraspinals and subocciptals Decreased R sided lateral glides and R UPA mobility in C2/3, 3/4, C5/6  CERVICAL ROM:    ROM A/PROM (deg) 11/09/2021 AROM 01/04/22  Flexion full   Extension full   Right lateral flexion 29 / full 31 deg  Left lateral flexion 13 / full but tight 29 deg  Right rotation 72 Pain on R / full 60 deg  Left rotation 54 Pull on L down to scapula / full 63 deg   (Blank rows = not tested)  UE ROM: WNL     UE MMT: 5/5 UE and neck except deep neck flexors 5-/5   Mid traps R 5/5 L 4+/5   Low traps R 4+/5 L 4/5  CERVICAL SPECIAL TESTS:  Negative compression/distraction   TODAY'S TREATMENT:   02/03/22 Therapeutic Exercise: UBE L2 3 min fwd/ 64mn back W back w/ low trap set and scap retraction x 10 Serratus slide RTB x 10 UE clocks red TB - lateral and into scaption x 5 each Seated horizontal ABD with chest supported against row machine x 10 red TB Seated row machine 2x10 20# Standing open book x 10  Standing thoracic ext on wall w/ shld flex x 10 Manual Therapy: to decrease muscle spasm and pain and improve mobility UPA/PA mobs T4, STM/TPR to T4 multifidi, R rhomboids, R infraspinatus and lat.; skilled palpation and monitoring during dry needling. Trigger Point Dry-Needling  Treatment instructions: Expect mild to moderate muscle soreness. S/S of pneumothorax if dry needled over a lung field, and to seek immediate medical attention should they occur. Patient verbalized understanding of these instructions and education.  Patient Consent Given: Yes Education handout provided: Previously provided Muscles treated: T4/5 muldifidi (R&L), R infraspinatus, R lat Treatment response/outcome: Twitch Response Elicited and Palpable Increase in Muscle Length   02/01/2022 Therapeutic Exercise: to improve strength and mobility.  Demo, verbal and tactile cues throughout for technique. UBE L2 x 6 min  (356fb) Open books on wall x 10 bil  Wall walk ups with RTB x 10 Serratus RTB x 10 Seated horiz ABD w/ chest supported and yellow TB x 10 Counter PU x 20 Wall angels x 10  Manual Therapy: B scapula mobilizations all directions  01/27/2022 Therapeutic Exercise: to improve strength and mobility.   UBE L2 x 6 min ([id number]) After manual therapy: shoulder rolls, open arms Y stretch, open book stretches R x 10 at wall.  Manual Therapy: to decrease muscle spasm and pain and improve mobility PA, R UPA thoracic T4-T8; STM/TPR to R periscapular musculature, subscaps, lats and pecs; skilled palpation and monitoring during dry needling. Trigger Point Dry-Needling  Treatment instructions: Expect mild to moderate muscle soreness. S/S of pneumothorax if dry needled over a lung field, and to seek immediate medical attention should they occur. Patient verbalized understanding of these instructions and education.  Patient Consent Given: Yes Education handout provided: Previously provided Muscles treated: R subscap (both medial border scapula and distally in axilla, R lat (from axilla), R T5-6 multifidi, R levator, R UT, R pectoralis major and minor Treatment response/outcome: Twitch Response Elicited and Palpable Increase in Muscle Length   01/25/22 Therapeutic Exercise: UBE L2 x 6 min (12f/99f Low trap set w/ wall slide using RTB x 12 Low horizontal ABD with RTB x 12 pallof press RTB both ways x 10  Cobra pose x 30 sec   Manual Therapy: STM to B cervicothoracic paraspinals, rhomboids, infraspinatus, UT  Modalities:  MHP to B neck for x 10 min in sitting  01/20/22 Therapeutic Exercise: to improve strength and mobility.   UBE L2 x 6 min (58f3b) Thoracic stretches  On lengthwise foam roller - shoulder flexion x 10 bil , Y's x 10, angels x 10 - tolerated well reported decreased tightness in R shoulder Thoracic mob over foam roller - increased parasthesias bil hands.  Manual Therapy: to decrease muscle  spasm and pain and improve mobility.  TPR to R levator and subscap, UPA mobs T4, skilled palpation and monitoring during dry needling. Trigger Point Dry-Needling  Treatment instructions: Expect mild to moderate muscle soreness. S/S of pneumothorax if dry needled over a lung field, and to seek immediate medical attention should they occur. Patient verbalized understanding of these instructions and education.  Patient Consent Given: Yes Education handout provided: Previously provided Muscles treated: bil T4 multifidi, R levator, R UT, R supraspinatus Treatment response/outcome: Twitch Response Elicited and Palpable Increase in Muscle Length   PATIENT EDUCATION:  Education details: continue HEP Person educated: Patient Education method: ECustomer service managerEducation comprehension: verbalized understanding and returned demonstration   HOME EXERCISE PROGRAM: Access Code: [email or web address]   ASSESSMENT:  CLINICAL IMPRESSION: Continued to progress postural strengthening focusing on scapular depression and retraction. Cues during session to reset shoulders with exercises and for proper muscle activation. Pt had no issues with the exercises and met LTG #2 for pain improvement. Supervising PT was brought in post session to perform TPDN.     (PT) - noted trigger points/tenderness still at T4 and reproduction of symptoms with T4 mobs.  Also noted TrP in R infraspinatus, lat and rhomboids.  Tolerated manual therapy well, reporting decreased symptoms and tightness following interventions.  PLetita Prentisscontinues to demonstrate potential for improvement and would benefit from continued skilled therapy to address impairments.      OBJECTIVE IMPAIRMENTS decreased ROM, decreased strength, hypomobility, increased muscle spasms, impaired flexibility, impaired UE functional use, and postural dysfunction.   ACTIVITY LIMITATIONS  [place] activity .   PERSONAL FACTORS Time since onset of  injury/illness/exacerbation and 3+ comorbidities: OP, migraines, h/o thyroidectomy and radiation for breast CA  are also affecting patient's functional outcome.    GOALS: Goals reviewed with patient? Yes  SHORT TERM GOALS: Target date: 11/23/2021   Patient will be independent with initial HEP.  Baseline:  Goal status: MET   LONG TERM GOALS: Target date: 02/15/22 Patient will be independent with advanced/ongoing HEP to improve outcomes and carryover.  Baseline:  Goal status: IN PROGRESS  2.  Patient will report 75% improvement in neck/back tingling to improve QOL.  Baseline:  Goal status: MET - 02/03/22(70-80% improvement)  3.  Patient will demonstrate full cervical rotation for safety with driving and left lateral SB within 5 deg of R indicating improved flexibility.  Baseline:  Goal status: IN PROGRESS  4.  Patient will demonstrate improved posture to decrease muscle imbalance. Baseline:  Goal status: IN PROGRESS - 01/04/22 ( pt shows good postural alignment at rest, notes that when sitting at home in recliner)  5.  [facility] difficulty.    Baseline:  Goal status: IN PROGRESS - 01/04/22 ( 30% improvement but pt reports that she does not do a lot of [place] stuff so it's hard to tell if any improvement)   PLAN: PT FREQUENCY: 2x/week  PT DURATION: 6 weeks to 02/15/22  PLANNED INTERVENTIONS: Therapeutic exercises, Therapeutic activity, Neuromuscular re-education, Patient/Family education, Dry Needling, Electrical  stimulation, Spinal mobilization, Cryotherapy, Moist heat, Ultrasound, and Manual therapy  PLAN FOR NEXT SESSION:  Progress HEP as needed, DN/MT, neck flexibility, upper back/postural strength, cervical stab   Rennie Natter, PT, DPT  02/03/2022, 12:30 PM  Artist Pais, PTA 02/03/2022, 11:10 AM

## 2022-02-06 ENCOUNTER — Ambulatory Visit: Payer: Medicare Other

## 2022-02-06 DIAGNOSIS — R252 Cramp and spasm: Secondary | ICD-10-CM | POA: Diagnosis not present

## 2022-02-06 DIAGNOSIS — M542 Cervicalgia: Secondary | ICD-10-CM | POA: Diagnosis not present

## 2022-02-06 DIAGNOSIS — M6281 Muscle weakness (generalized): Secondary | ICD-10-CM

## 2022-02-06 NOTE — Therapy (Signed)
OUTPATIENT PHYSICAL THERAPY TREATMENT NOTE   Patient Name: Kimberly Miles MRN: [id number] DOB:[birth year], 66 y.o., female Today's Date: 02/06/2022     PT End of Session - 02/06/22 1021     Visit Number 17    Date for PT Re-Evaluation 02/15/22    Authorization Type MCR    Progress Note Due on Visit 97    PT Start Time 0936    PT Stop Time 1029    PT Time Calculation (min) 53 min    Activity Tolerance Patient tolerated treatment well    Behavior During Therapy [facility] for tasks assessed/performed                    Rationale for Evaluation and Treatment:  Rehabilitation    Past Medical History:  Diagnosis Date   Anxiety    Arthritis    Cancer ([place]) 11/2018   right breast cancer   Common migraine with intractable migraine 40/81/4481   Complication of anesthesia    Diverticulosis    Elevated LFTs    monitored by pcp per pt --  unknown etiology   Family history of premature CAD 09/01/2013   Family history of prostate cancer    Family history of uterine cancer    History of basal cell carcinoma excision    2004   &  2014   History of colitis    History of palpitations    History of thyroid nodule    s/p  right thyroid lobectomy 03-08-2011  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   IBS (irritable bowel syndrome)    Migraine    MTHFR gene mutation    Personal history of radiation therapy    PMB (postmenopausal bleeding)    PONV (postoperative nausea and vomiting)    Post-surgical hypothyroidism    Past Surgical History:  Procedure Laterality Date   APPENDECTOMY  as child   BREAST BIOPSY Left 1979   benign   BREAST EXCISIONAL BIOPSY Left    BREAST LUMPECTOMY Right 01/2019   BREAST LUMPECTOMY WITH RADIOACTIVE SEED AND SENTINEL LYMPH NODE BIOPSY Right 02/04/2019   Procedure: RIGHT BREAST LUMPECTOMY WITH RADIOACTIVE SEED AND RIGHT AXILLARY DEEP SENTINEL LYMPH NODE BIOPSY WITH BLUE DYE INJECTION;  Surgeon: Fanny Skates, MD;  Location: [place];  Service: General;  Laterality: Right;   BREAST REDUCTION SURGERY Bilateral 02/11/2019   Procedure: RIGHT BREAST ONCOPLASTIC RECONSTRUCTION, LEFT BREAST REDUCTION;  Surgeon: Irene Limbo, MD;  Location: [place];  Service: Plastics;  Laterality: Bilateral;   CARDIOVASCULAR STRESS TEST  09-16-2013   normal nuclear study w/ no ischemia/  normal LV function and wall motion , ef 84%   CESAREAN SECTION  1977 and 1978   Bilateral Tubal Ligation w/ last c/s   COLPOSCOPY  05/2009   CIN 1   HYSTEROSCOPY WITH D & C N/A 12/23/2015   Procedure: DILATATION AND CURETTAGE /HYSTEROSCOPY ;  Surgeon: Megan Salon, MD;  Location: [facility];  Service: Gynecology;  Laterality: N/A;   NEGATIVE SLEEP STUDY  2014  per pt   OOPHORECTOMY Right 1985   ruptured cyst    REDUCTION MAMMAPLASTY Bilateral 01/2019   THYROID LOBECTOMY Right 03-08-2011   TRANSTHORACIC ECHOCARDIOGRAM  05-09-2011   ef 64%/  mild MR and TR   TUBAL LIGATION     Patient Active Problem List   Diagnosis Date Noted   Paresthesia 11/24/2021   Hyperreflexia 11/24/2021   Lower abdominal pain 04/26/2021  Acute cough 04/26/2021   Vitamin D deficiency 04/26/2021   Abdominal bloating 04/26/2021   Age-related osteoporosis without current pathological fracture 04/09/2020   Genetic testing 08/07/2019   Family history of prostate cancer    Family history of uterine cancer    MTHFR gene mutation 01/16/2019   Epigastric pain 01/15/2019   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, estrogen receptor positive ([place]) 12/24/2018   Hypothyroid 11/11/2018   Memory change 11/13/2016   Common migraine with intractable migraine 04/06/2016   H/O partial thyroidectomy 01/14/2014   Family history of premature CAD 09/01/2013   History of migraine headaches 02/21/2013   Hyperlipidemia 02/21/2013   Thyroid function study abnormality 02/21/2013    PCP: Ann Held, DO  REFERRING  PROVIDER: Jessy Oto, MD  REFERRING DIAG: M54.2 (ICD-10-CM) - Cervicalgia M47.22 (ICD-10-CM) - Other spondylosis with radiculopathy, cervical region  THERAPY DIAG:  Cervicalgia  Cramp and spasm  Muscle weakness (generalized)  ONSET DATE: 06/26/21  SUBJECTIVE:                                                                                                                                                                                                         SUBJECTIVE STATEMENT: Pt had a headache for most of the weekend and was sore after exercise + DN combo last visit.    PERTINENT HISTORY:  Migraines, R lumpectomy and radiation 2020/2021,osteoporosis, thyroidectomy (one lobe precancerous)  PAIN:  Are you having pain? No    PRECAUTIONS: Other: h/o R lumpectomy and radiation 2020/2021,osteoporosis  PATIENT GOALS get rid of the numbness and tingling  OBJECTIVE: (objective measurements taken at eval unless otherwise dated)   DIAGNOSTIC FINDINGS:  XR 11/03/21: mild degenerative disc disease C5-6 and C6-7. There is uncovertebral hypertrophy at the C5-6 level and mild C6-7. Some reversal of the lover one half of the cervical spine. MRI 10/15/21: 1. At C5-6 there is a tiny right paracentral disc protrusion. Bilateral uncovertebral degenerative changes. Moderate bilateral foraminal stenosis. 2. At C6-7 there is a mild broad-based disc bulge. Bilateral uncovertebral degenerative changes. Mild bilateral foraminal stenosis. 12/02/2021 IMPRESSION: This MRI of the thoracic spine with and without contrast shows the following: 1.   The spinal cord appears normal before and after contrast. 2.   Mild degenerative changes in the lower cervical spine not leading to spinal stenosis or nerve root compression.  PATIENT SURVEYS:  FOTO FS = 70 (predicted 71)  SENSATION: Decreased sensation in ant neck due to thryroid surgery Some decreased sensation in right scapula and b/w spine  and  scapula  POSTURE:  Rounded shoulders, else WNL  PALPATION: Increased muscle tension in B UT and right cervical paraspinals and subocciptals Decreased R sided lateral glides and R UPA mobility in C2/3, 3/4, C5/6  CERVICAL ROM:    ROM A/PROM (deg) 11/09/2021 AROM 01/04/22  Flexion full   Extension full   Right lateral flexion 29 / full 31 deg  Left lateral flexion 13 / full but tight 29 deg  Right rotation 72 Pain on R / full 60 deg  Left rotation 54 Pull on L down to scapula / full 63 deg   (Blank rows = not tested)  UE ROM: WNL     UE MMT: 5/5 UE and neck except deep neck flexors 5-/5   Mid traps R 5/5 L 4+/5   Low traps R 4+/5 L 4/5  CERVICAL SPECIAL TESTS:  Negative compression/distraction   TODAY'S TREATMENT:  02/06/22 Therapeutic Exercise: UBE L2 x 42mn back/342m back  Manual Therapy: STM to B suboccipitals, UT, LS Gentle cervical PROM   Modailities: Estim + moist heat - x 12 min, IFC, 80-150 Hz, intensity to tolerance  02/03/22 Therapeutic Exercise: UBE L2 3 min fwd/ [id number] W back w/ low trap set and scap retraction x 10 Serratus slide RTB x 10 UE clocks red TB - lateral and into scaption x 5 each Seated horizontal ABD with chest supported against row machine x 10 red TB Seated row machine 2x10 20# Standing open book x 10  Standing thoracic ext on wall w/ shld flex x 10 Manual Therapy: to decrease muscle spasm and pain and improve mobility UPA/PA mobs T4, STM/TPR to T4 multifidi, R rhomboids, R infraspinatus and lat.; skilled palpation and monitoring during dry needling. Trigger Point Dry-Needling  Treatment instructions: Expect mild to moderate muscle soreness. S/S of pneumothorax if dry needled over a lung field, and to seek immediate medical attention should they occur. Patient verbalized understanding of these instructions and education.  Patient Consent Given: Yes Education handout provided: Previously provided Muscles treated: T4/5 muldifidi  (R&L), R infraspinatus, R lat Treatment response/outcome: Twitch Response Elicited and Palpable Increase in Muscle Length   02/01/2022 Therapeutic Exercise: to improve strength and mobility.  Demo, verbal and tactile cues throughout for technique. UBE L2 x 6 min (65f/27f Open books on wall x 10 bil  Wall walk ups with RTB x 10 Serratus RTB x 10 Seated horiz ABD w/ chest supported and yellow TB x 10 Counter PU x 20 Wall angels x 10  Manual Therapy: B scapula mobilizations all directions  01/27/2022 Therapeutic Exercise: to improve strength and mobility.   UBE L2 x 6 min (65f/354fAfter manual therapy: shoulder rolls, open arms Y stretch, open book stretches R x 10 at wall.  Manual Therapy: to decrease muscle spasm and pain and improve mobility PA, R UPA thoracic T4-T8; STM/TPR to R periscapular musculature, subscaps, lats and pecs; skilled palpation and monitoring during dry needling. Trigger Point Dry-Needling  Treatment instructions: Expect mild to moderate muscle soreness. S/S of pneumothorax if dry needled over a lung field, and to seek immediate medical attention should they occur. Patient verbalized understanding of these instructions and education.  Patient Consent Given: Yes Education handout provided: Previously provided Muscles treated: R subscap (both medial border scapula and distally in axilla, R lat (from axilla), R T5-6 multifidi, R levator, R UT, R pectoralis major and minor Treatment response/outcome: Twitch Response Elicited and Palpable Increase in Muscle Length   01/25/22 Therapeutic Exercise: UBE  L2 x 6 min (69f3b) Low trap set w/ wall slide using RTB x 12 Low horizontal ABD with RTB x 12 pallof press RTB both ways x 10  Cobra pose x 30 sec   Manual Therapy: STM to B cervicothoracic paraspinals, rhomboids, infraspinatus, UT  Modalities: MHP to B neck for x 10 min in sitting  01/20/22 Therapeutic Exercise: to improve strength and mobility.   UBE L2 x 6 min  (368fb) Thoracic stretches  On lengthwise foam roller - shoulder flexion x 10 bil , Y's x 10, angels x 10 - tolerated well reported decreased tightness in R shoulder Thoracic mob over foam roller - increased parasthesias bil hands.  Manual Therapy: to decrease muscle spasm and pain and improve mobility.  TPR to R levator and subscap, UPA mobs T4, skilled palpation and monitoring during dry needling. Trigger Point Dry-Needling  Treatment instructions: Expect mild to moderate muscle soreness. S/S of pneumothorax if dry needled over a lung field, and to seek immediate medical attention should they occur. Patient verbalized understanding of these instructions and education.  Patient Consent Given: Yes Education handout provided: Previously provided Muscles treated: bil T4 multifidi, R levator, R UT, R supraspinatus Treatment response/outcome: Twitch Response Elicited and Palpable Increase in Muscle Length   PATIENT EDUCATION:  Education details: continue HEP Person educated: Patient Education method: ExCustomer service managerducation comprehension: verbalized understanding and returned demonstration   HOME EXERCISE PROGRAM: Access Code: [email or web address]   ASSESSMENT:  CLINICAL IMPRESSION: Pt noted more soreness with a headache today. Focused session on MT to decrease muscle tension and pain to improve tolerance for daily activities. Most tension was found along L suboccipitals and UT. Finished session with estim + moist heat for pain reduction and to increase tissue extensibility.    OBJECTIVE IMPAIRMENTS decreased ROM, decreased strength, hypomobility, increased muscle spasms, impaired flexibility, impaired UE functional use, and postural dysfunction.   ACTIVITY LIMITATIONS  OH activity .   PERSONAL FACTORS Time since onset of injury/illness/exacerbation and 3+ comorbidities: OP, migraines, h/o thyroidectomy and radiation for breast CA  are also affecting patient's functional outcome.     GOALS: Goals reviewed with patient? Yes  SHORT TERM GOALS: Target date: 11/23/2021   Patient will be independent with initial HEP.  Baseline:  Goal status: MET   LONG TERM GOALS: Target date: 02/15/22 Patient will be independent with advanced/ongoing HEP to improve outcomes and carryover.  Baseline:  Goal status: IN PROGRESS  2.  Patient will report 75% improvement in neck/back tingling to improve QOL.  Baseline:  Goal status: MET - 02/03/22(70-80% improvement)  3.  Patient will demonstrate full cervical rotation for safety with driving and left lateral SB within 5 deg of R indicating improved flexibility.  Baseline:  Goal status: IN PROGRESS  4.  Patient will demonstrate improved posture to decrease muscle imbalance. Baseline:  Goal status: IN PROGRESS - 01/04/22 ( pt shows good postural alignment at rest, notes that when sitting at home in recliner)  5.  Patient will be able to work [facility] less difficulty.    Baseline:  Goal status: IN PROGRESS - 01/04/22 ( 30% improvement but pt reports that she does not do a lot of [place] stuff so it's hard to tell if any improvement)   PLAN: PT FREQUENCY: 2x/week  PT DURATION: 6 weeks to 02/15/22  PLANNED INTERVENTIONS: Therapeutic exercises, Therapeutic activity, Neuromuscular re-education, Patient/Family education, Dry Needling, Electrical stimulation, Spinal mobilization, Cryotherapy, Moist heat, Ultrasound, and Manual therapy  PLAN FOR NEXT  SESSION:  DN/MT to address tension in neck, neck flexibility, upper back/postural strength, cervical stab   Artist Pais, PTA 02/06/2022, 10:36 AM

## 2022-02-09 ENCOUNTER — Ambulatory Visit: Payer: Medicare Other | Admitting: Physical Therapy

## 2022-02-09 ENCOUNTER — Encounter: Payer: Self-pay | Admitting: Physical Therapy

## 2022-02-09 DIAGNOSIS — M542 Cervicalgia: Secondary | ICD-10-CM | POA: Diagnosis not present

## 2022-02-09 DIAGNOSIS — R252 Cramp and spasm: Secondary | ICD-10-CM

## 2022-02-09 DIAGNOSIS — M6281 Muscle weakness (generalized): Secondary | ICD-10-CM | POA: Diagnosis not present
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
## 2022-02-14 ENCOUNTER — Other Ambulatory Visit: Payer: Self-pay | Admitting: Hematology and Oncology

## 2022-02-14 ENCOUNTER — Ambulatory Visit (HOSPITAL_BASED_OUTPATIENT_CLINIC_OR_DEPARTMENT_OTHER): Payer: Medicare Other | Admitting: Obstetrics & Gynecology

## 2022-02-14 DIAGNOSIS — Z1231 Encounter for screening mammogram for malignant neoplasm of breast: Secondary | ICD-10-CM

## 2022-02-15 ENCOUNTER — Other Ambulatory Visit (HOSPITAL_COMMUNITY)
Admission: RE | Admit: 2022-02-15 | Discharge: 2022-02-15 | Disposition: A | Discharge status: Home / Self Care | Payer: Medicare Other | Source: Ambulatory Visit | Attending: Obstetrics & Gynecology | Admitting: Obstetrics & Gynecology

## 2022-02-15 ENCOUNTER — Ambulatory Visit (INDEPENDENT_AMBULATORY_CARE_PROVIDER_SITE_OTHER): Payer: Medicare Other | Admitting: Obstetrics & Gynecology

## 2022-02-15 ENCOUNTER — Encounter: Payer: Medicare Other | Admitting: Physical Therapy

## 2022-02-15 ENCOUNTER — Encounter (HOSPITAL_BASED_OUTPATIENT_CLINIC_OR_DEPARTMENT_OTHER): Payer: Self-pay | Admitting: Obstetrics & Gynecology

## 2022-02-15 VITALS — BP 116/66 | HR 63 | Ht 59.0 in | Wt 113.0 lb

## 2022-02-15 DIAGNOSIS — Z124 Encounter for screening for malignant neoplasm of cervix: Secondary | ICD-10-CM

## 2022-02-15 DIAGNOSIS — R87615 Unsatisfactory cytologic smear of cervix: Secondary | ICD-10-CM

## 2022-02-15 NOTE — Progress Notes (Signed)
GYNECOLOGY  VISIT  CC:   repeat pap  HPI: 66 y.o. G2P2 Married female here for repeat pap smear due to non diagnostic results from prior pap smear obtained 01/12/2022.  Has no new complaints.  Findings reviewed and need for repeat discussed.  Questions answered.   Review of Systems  Constitutional: Negative.   Genitourinary: Negative.     PHYSICAL EXAMINATION:    BP 116/66 (BP Location: Right Arm, Patient Position: Sitting, Cuff Size: Normal)   Pulse 63   Ht '4\' 11"'$  (1.499 m) Comment: reported  Wt 113 lb (51.3 kg)   LMP 08/25/2010   BMI 22.82 kg/m     General appearance: alert, cooperative and appears stated age Lymph:  no inguinal LAD noted  Pelvic: External genitalia:  no lesions              Urethra:  normal appearing urethra with no masses, tenderness or lesions              Bartholins and Skenes: normal                 Vagina: atrophic vaginal mucosa, no lesions              Cervix: no lesions          Chaperone, Octaviano Batty, CMA, was present for exam.  Assessment/Plan: 1. Encounter for repeat Pap smear due to previous insuff cervical cells - Cytology - PAP( [place])  2. Cervical cancer screening

## 2022-02-20 LAB — CYTOLOGY - PAP: Diagnosis: NEGATIVE

## 2022-02-22 DIAGNOSIS — L601 Onycholysis: Secondary | ICD-10-CM | POA: Diagnosis not present

## 2022-02-22 DIAGNOSIS — L820 Inflamed seborrheic keratosis: Secondary | ICD-10-CM | POA: Diagnosis not present

## 2022-02-22 DIAGNOSIS — D2271 Melanocytic nevi of right lower limb, including hip: Secondary | ICD-10-CM | POA: Diagnosis not present

## 2022-02-22 DIAGNOSIS — D1801 Hemangioma of skin and subcutaneous tissue: Secondary | ICD-10-CM | POA: Diagnosis not present

## 2022-02-22 DIAGNOSIS — L570 Actinic keratosis: Secondary | ICD-10-CM | POA: Diagnosis not present

## 2022-02-22 DIAGNOSIS — L821 Other seborrheic keratosis: Secondary | ICD-10-CM | POA: Diagnosis not present

## 2022-02-22 DIAGNOSIS — Z85828 Personal history of other malignant neoplasm of skin: Secondary | ICD-10-CM | POA: Diagnosis not present

## 2022-02-22 DIAGNOSIS — R208 Other disturbances of skin sensation: Secondary | ICD-10-CM | POA: Diagnosis not present

## 2022-02-22 DIAGNOSIS — L650 Telogen effluvium: Secondary | ICD-10-CM | POA: Diagnosis not present

## 2022-02-22 DIAGNOSIS — D2272 Melanocytic nevi of left lower limb, including hip: Secondary | ICD-10-CM | POA: Diagnosis not present

## 2022-02-22 DIAGNOSIS — L720 Epidermal cyst: Secondary | ICD-10-CM | POA: Diagnosis not present

## 2022-02-22 DIAGNOSIS — L814 Other melanin hyperpigmentation: Secondary | ICD-10-CM | POA: Diagnosis not present

## 2022-02-22 NOTE — Progress Notes (Signed)
 Patient Care Team: Gudena, Vinay, MD as PCP - General (Hematology and Oncology) Freeman, Marshall, MD as Referring Physician (Specialist) Lathrop, Tracy, MD as Consulting Physician (Obstetrics and Gynecology) Rosen, Jefry, MD as Consulting Physician (Otolaryngology) Miller, Mary S, MD as Consulting Physician (Gynecology) Gudena, Vinay, MD as Consulting Physician (Hematology and Oncology) Squire, Sarah, MD as Attending Physician (Radiation Oncology) Thimmappa, Brinda, MD as Consulting Physician (Plastic Surgery) [name], Haywood, MD as Consulting Physician (General Surgery)  DIAGNOSIS:  Encounter Diagnosis  Name Primary?   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, estrogen receptor positive (HCC)  12/24/2018 Initial Diagnosis   Palpable lump in the right breast, 2.1 cm, biopsy revealed grade 2 invasive ductal carcinoma that was ER 100%, PR 50%, Ki-67 50%, HER-2 negative, 2+ by IHC FISH ratio 1.27, T2N0 stage IB   12/24/2018 Cancer Staging   Staging form: Breast, AJCC 8th Edition - Clinical: Stage IB (cT2, cN0, cM0, G2, ER+, PR+, HER2-) - Signed by Squire, Sarah, MD on 12/24/2018   11/2018 - 11/2023 Anti-estrogen oral therapy   Anastrozole 1 mg   02/04/2019 Surgery   Right lumpectomy ([name]): IDC, 2.5cm, grade 1, clear margins, and 3 lymph nodes negative.   02/04/2019 Cancer Staging   Staging form: Breast, AJCC 8th Edition - Pathologic stage from 02/04/2019: Stage IA (pT2, pN0, cM0, G1, ER+, PR+, HER2-, Oncotype DX score: 18) - Signed by Causey, Lindsey Cornetto, NP on 02/19/2019   02/11/2019 Surgery   Bil Mammaplasty: benign   03/24/2019 - 04/14/2019 Radiation Therapy   The patient initially received a dose of 42.56 Gy in 16 fractions to the breast using whole-breast tangent fields. This was delivered using a 3-D  conformal technique. The total dose was 42.56 Gy.    Oncotype testing   The Oncotype DX score was 18 predicting a risk of outside the breast recurrence over the next 9 years of 5 % if the patient's only systemic therapy is tamoxifen for 5 years.   08/06/2019 Genetic Testing   Negative genetic testing on the common hereditary cancer panel.  The Common Hereditary Gene Panel offered by [facility] includes sequencing and/or deletion duplication testing of the following 48 genes: APC, ATM, AXIN2, BARD1, BMPR1A, BRCA1, BRCA2, BRIP1, CDH1, CDK4, CDKN2A (p14ARF), CDKN2A (p16INK4a), CHEK2, CTNNA1, DICER1, EPCAM (Deletion/duplication testing only), GREM1 (promoter region deletion/duplication testing only), KIT, MEN1, MLH1, MSH2, MSH3, MSH6, MUTYH, NBN, NF1, NHTL1, PALB2, PDGFRA, PMS2, POLD1, POLE, PTEN, RAD50, RAD51C, RAD51D, RNF43, SDHB, SDHC, SDHD, SMAD4, SMARCA4. STK11, TP53, TSC1, TSC2, and VHL.  The following genes were evaluated for sequence changes only: SDHA and HOXB13 c.251G>A variant only. The report date is August 06, 2019.     CHIEF COMPLIANT: Follow-up right breast cancer Surveillance  INTERVAL HISTORY: Kimberly Miles is a 65 y.o. female with above-mentioned history of right breast cancer diagnosed in 2020.  She underwent right lumpectomy followed by radiation. She presents to the clinic today for a follow-up. She states that she is not taking the exemestane because of the joint pain and some mild fatigue. She getting more brain fog and finger nails are shot and she bruising more. She denies not loosing weight. She complains of tingling numbness going up the back.   ALLERGIES:  is allergic to codeine, demerol [meperidine], erythromycin, sulfa antibiotics, ampicillin, and chlorhexidine.  MEDICATIONS:  Current Outpatient Medications  Medication Sig Dispense   Refill   Ascorbic Acid (VITAMIN C) 1000 MG tablet Take 1,000 mg by mouth daily. 2 tablet daily     ASTAXANTHIN PO Take by mouth.  (Patient not taking: Reported on 01/12/2022)     Cholecalciferol (VITAMIN D3) 5000 UNITS CAPS Take 1 capsule by mouth daily.     exemestane (AROMASIN) 25 MG tablet Take 1 tablet (25 mg total) by mouth daily after breakfast. (Patient not taking: Reported on 02/24/2022) 90 tablet 1   metaxalone (SKELAXIN) 800 MG tablet Take 0.5 tablets (400 mg total) by mouth 3 (three) times daily. (Patient not taking: Reported on 11/24/2021) 30 tablet 0   zinc gluconate 50 MG tablet Take 15 mg by mouth daily.     No current facility-administered medications for this visit.    PHYSICAL EXAMINATION: ECOG PERFORMANCE STATUS: 1 - Symptomatic but completely ambulatory  Vitals:   02/24/22 0929  BP: 139/62  Pulse: 67  Resp: 14  Temp: (!) 97 F (36.1 C)  SpO2: 100%   Filed Weights   02/24/22 0929  Weight: 114 lb 8 oz (51.9 kg)      LABORATORY DATA:  I have reviewed the data as listed    Latest Ref Rng & Units 12/15/2021    9:44 AM 09/29/2021    4:50 PM 04/26/2021    9:19 AM  CMP  Glucose 70 - 99 mg/dL 93  94  93   BUN 6 - 23 mg/dL 13  17  11   Creatinine 0.40 - 1.20 mg/dL 0.77  0.75  0.69   Sodium 135 - 145 mEq/L 140  142  141   Potassium 3.5 - 5.1 mEq/L 5.6 No hemolysis seen  5.3  5.1   Chloride 96 - 112 mEq/L 104  107  105   CO2 19 - 32 mEq/L 30  26  30   Calcium 8.4 - 10.5 mg/dL 9.9  9.9  9.4   Total Protein 6.0 - 8.3 g/dL 6.7  6.5  6.2   Total Bilirubin 0.2 - 1.2 mg/dL 0.8  0.4  0.6   Alkaline Phos 39 - 117 U/L 91   79   AST 0 - 37 U/L 36  29  19   ALT 0 - 35 U/L 56  53  31     Lab Results  Component Value Date   WBC 7.4 09/29/2021   HGB 14.1 09/29/2021   HCT 41.4 09/29/2021   MCV 95.0 09/29/2021   PLT 239 09/29/2021   NEUTROABS 4,669 09/29/2021    ASSESSMENT & PLAN:  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  ER PR positive HER-2 negative, Ki-67 50%   Adjuvant radiation 03/25/2019-04/13/20 ----------------------------------------------------------------- Treatment plan: Adjuvant antiestrogen therapy with anastrozole 12/18/2019 discontinued because of continued muscle aches and pains Switched to letrozole 02/25/2020 half a tablet daily, switched to exemestane 01/24/21   Exemestane toxicities: Tolerating it extremely well currently.   The treatment was held because of elevated liver function with the suspicion that it may be causing the elevation   of LFTs.   Breast cancer surveillance:  1. Mammogram 01/06/2021 no mammographic evidence of malignancy breast density category B 2. Breast MRI 06/09/2022 benign breast density category B:(will obtain annually because her breast cancer was only detected through an MRI) 3. Bone density 12/02/2019; T score -3 (takes osteostrong)   Osteoporosis: Patient wants to try osteogenic's exercise/specialized equipment program to improve bone density.    We will order a breast MRI to be done in 6 months We will request Signatera testing. Fatigue: We will perform blood work for TSH and CBC CMP today.  The liver function tests are the same as before then she will resume exemestane.  This will rule out exemestane as a cause of elevation of LFTs.    Orders Placed This Encounter  Procedures   MR BREAST BILATERAL W WO CONTRAST INC CAD    Standing Status:   Future    Standing Expiration Date:   02/25/2023    Order Specific Question:   If indicated for the ordered procedure, I authorize the administration of contrast media per  Radiology protocol    Answer:   Yes    Order Specific Question:   What is the patient's sedation requirement?    Answer:   No Sedation    Order Specific Question:   Does the patient have a pacemaker or implanted devices?    Answer:   No    Order Specific Question:   Preferred imaging location?    Answer:   [facility] (table limit-550lbs)   CBC with Differential ([place] Only)    Standing Status:   Future    Standing Expiration Date:   02/25/2023   CMP ([place] only)    Standing Status:   Future    Standing Expiration Date:   02/25/2023   Thyroid Panel With TSH    Standing Status:   Future    Standing Expiration Date:   02/24/2023   The patient has a good understanding of the overall plan. she agrees with it. she will call with any problems that may develop before the next visit here. Total time spent: 30 mins including face to face time and time spent for planning, charting and co-ordination of care   Harriette Ohara, MD 02/24/22    I Gardiner Coins am scribing for [name]  I have reviewed the above documentation for accuracy and completeness, and I agree with the above.

## 2022-02-24 ENCOUNTER — Inpatient Hospital Stay: Payer: Medicare Other

## 2022-02-24 ENCOUNTER — Other Ambulatory Visit: Payer: Self-pay

## 2022-02-24 ENCOUNTER — Inpatient Hospital Stay: Payer: Medicare Other | Attending: Hematology and Oncology | Admitting: Hematology and Oncology

## 2022-02-24 ENCOUNTER — Other Ambulatory Visit: Payer: Self-pay | Admitting: *Deleted

## 2022-02-24 VITALS — BP 139/62 | HR 67 | Temp 97.0°F | Resp 14 | Wt 114.5 lb

## 2022-02-24 DIAGNOSIS — C50311 Malignant neoplasm of lower-inner quadrant of right female breast: Secondary | ICD-10-CM

## 2022-02-24 DIAGNOSIS — R7401 Elevation of levels of liver transaminase levels: Secondary | ICD-10-CM | POA: Diagnosis not present

## 2022-02-24 DIAGNOSIS — Z170 Estrogen receptor positive status [ER+]: Secondary | ICD-10-CM | POA: Diagnosis not present

## 2022-02-24 DIAGNOSIS — M810 Age-related osteoporosis without current pathological fracture: Secondary | ICD-10-CM | POA: Diagnosis not present

## 2022-02-24 DIAGNOSIS — R5383 Other fatigue: Secondary | ICD-10-CM | POA: Diagnosis not present

## 2022-02-24 DIAGNOSIS — Z79811 Long term (current) use of aromatase inhibitors: Secondary | ICD-10-CM | POA: Insufficient documentation

## 2022-02-24 LAB — CBC WITH DIFFERENTIAL (CANCER CENTER ONLY)
Abs Immature Granulocytes: 0.01 10*3/uL (ref 0.00–0.07)
Basophils Absolute: 0 10*3/uL (ref 0.0–0.1)
Basophils Relative: 1 %
Eosinophils Absolute: 0 10*3/uL (ref 0.0–0.5)
Eosinophils Relative: 0 %
HCT: 42.9 % (ref 36.0–46.0)
Hemoglobin: 14.7 g/dL (ref 12.0–15.0)
Immature Granulocytes: 0 %
Lymphocytes Relative: 23 %
Lymphs Abs: 1.5 10*3/uL (ref 0.7–4.0)
MCH: 32.5 pg (ref 26.0–34.0)
MCHC: 34.3 g/dL (ref 30.0–36.0)
MCV: 94.7 fL (ref 80.0–100.0)
Monocytes Absolute: 0.4 10*3/uL (ref 0.1–1.0)
Monocytes Relative: 6 %
Neutro Abs: 4.6 10*3/uL (ref 1.7–7.7)
Neutrophils Relative %: 70 %
Platelet Count: 231 10*3/uL (ref 150–400)
RBC: 4.53 MIL/uL (ref 3.87–5.11)
RDW: 12 % (ref 11.5–15.5)
WBC Count: 6.5 10*3/uL (ref 4.0–10.5)
nRBC: 0 % (ref 0.0–0.2)

## 2022-02-24 LAB — CMP (CANCER CENTER ONLY)
ALT: 117 U/L — ABNORMAL HIGH (ref 0–44)
AST: 73 U/L — ABNORMAL HIGH (ref 15–41)
Albumin: 4.5 g/dL (ref 3.5–5.0)
Alkaline Phosphatase: 109 U/L (ref 38–126)
Anion gap: 4 — ABNORMAL LOW (ref 5–15)
BUN: 13 mg/dL (ref 8–23)
CO2: 31 mmol/L (ref 22–32)
Calcium: 9.4 mg/dL (ref 8.9–10.3)
Chloride: 105 mmol/L (ref 98–111)
Creatinine: 0.69 mg/dL (ref 0.44–1.00)
GFR, Estimated: >60 mL/min (ref >60)
Glucose, Bld: 97 mg/dL (ref 70–99)
Potassium: 4 mmol/L (ref 3.5–5.1)
Sodium: 140 mmol/L (ref 135–145)
Total Bilirubin: 0.5 mg/dL (ref 0.3–1.2)
Total Protein: 6.7 g/dL (ref 6.5–8.1)

## 2022-02-24 NOTE — Assessment & Plan Note (Addendum)
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%, Ki-67 50%, HER-2 negative, 2+ by IHC FISH ratio 1.27, Breast MRI: 2.3 cm right lower quadrant enhancement Left breast biopsy: Fibroadenoma Oncotype score 18: Distant recurrence at 9 years: 5% T2N0 stage IB Preoperative anastrozole for 1 month  Right lumpectomy: 02/04/2019([name]): IDC, 2.5cm, grade 1, clear margins, and 3 lymph nodes negative.ER PR positive HER-2 negative, Ki-67 50%  Adjuvant radiation 03/25/2019-04/13/20 ----------------------------------------------------------------- Treatment plan: Adjuvant antiestrogen therapy with anastrozole 12/18/2019 discontinued because of continued muscle aches and pains Switched to letrozole 02/25/2020 half a tablet daily, switched to exemestane 01/24/21  Exemestane toxicities:Tolerating it extremely well currently.   The treatment was held because of elevated liver function with the suspicion that it may be causing the elevation of LFTs.  Breast cancer surveillance:  1. Mammogram 01/06/2021 no mammographic evidence of malignancy breast density category B 2. Breast MRI12/15/2023 benign breast density category B:(will obtain annually because her breast cancer was only detected through an MRI) 3. Bone density6/01/2020; T score -3(takes osteostrong)  Osteoporosis:Patient wants to try osteogenic's exercise/specialized equipment program to improve bone density.   We will order a breast MRI to be done in 6 months We will request Signatera testing. Fatigue: We will perform blood work for TSH and CBC CMP today.  The liver function tests are the same as before then she will resume exemestane.  This will rule out exemestane as a cause of elevation of LFTs.

## 2022-02-24 NOTE — Progress Notes (Signed)
Per MD request RN successfully placed Signatera orders and faxed path report.

## 2022-02-25 LAB — THYROID PANEL WITH TSH
Free Thyroxine Index: 1.7 (ref 1.2–4.9)
T3 Uptake Ratio: 21 % — ABNORMAL LOW (ref 24–39)
T4, Total: 8 ug/dL (ref 4.5–12.0)
TSH: 3.15 u[IU]/mL (ref 0.450–4.500)

## 2022-03-02 ENCOUNTER — Ambulatory Visit
Admission: RE | Admit: 2022-03-02 | Discharge: 2022-03-02 | Disposition: A | Discharge status: Home / Self Care | Payer: Medicare Other | Source: Ambulatory Visit | Attending: Hematology and Oncology | Admitting: Hematology and Oncology

## 2022-03-02 DIAGNOSIS — Z1231 Encounter for screening mammogram for malignant neoplasm of breast: Secondary | ICD-10-CM | POA: Diagnosis not present

## 2022-03-20 ENCOUNTER — Encounter: Payer: Self-pay | Admitting: Specialist

## 2022-03-22 ENCOUNTER — Encounter: Payer: Self-pay | Admitting: Hematology and Oncology

## 2022-04-01 LAB — SIGNATERA ONLY (NATERA MANAGED)
SIGNATERA MTM READOUT: 0 MTM/ml
SIGNATERA TEST RESULT: NEGATIVE

## 2022-04-03 ENCOUNTER — Telehealth: Payer: Self-pay

## 2022-04-03 NOTE — Telephone Encounter (Signed)
Called and spoke with pt, informed her of negative signatera results.  Pt verbalized understanding and thanks

## 2022-06-18 LAB — SIGNATERA
SIGNATERA MTM READOUT: 0 MTM/ml
SIGNATERA TEST RESULT: NEGATIVE

## 2022-06-22 ENCOUNTER — Telehealth: Payer: Self-pay

## 2022-06-22 NOTE — Telephone Encounter (Signed)
Called pt per MD to advise [facility] testing was negative/not detected. Pt verbalized understanding of results and knows [facility] will be in touch to schedule 3 mo repeat lab.   

## 2022-08-25 ENCOUNTER — Ambulatory Visit
Admission: RE | Admit: 2022-08-25 | Discharge: 2022-08-25 | Disposition: A | Discharge status: Home / Self Care | Payer: Medicare Other | Source: Ambulatory Visit | Attending: Hematology and Oncology | Admitting: Hematology and Oncology

## 2022-08-25 DIAGNOSIS — C50311 Malignant neoplasm of lower-inner quadrant of right female breast: Secondary | ICD-10-CM | POA: Diagnosis not present

## 2022-08-25 DIAGNOSIS — Z170 Estrogen receptor positive status [ER+]: Secondary | ICD-10-CM

## 2022-08-25 MED ORDER — GADOPICLENOL 0.5 MMOL/ML IV SOLN
5.0000 mL | Freq: Once | INTRAVENOUS | Status: AC | PRN
  Administered 2022-08-25: 5 mL via INTRAVENOUS

## 2022-09-07 LAB — SIGNATERA
SIGNATERA MTM READOUT: 0 MTM/ml
SIGNATERA TEST RESULT: NEGATIVE

## 2022-09-08 ENCOUNTER — Ambulatory Visit (INDEPENDENT_AMBULATORY_CARE_PROVIDER_SITE_OTHER): Payer: Medicare Other | Admitting: Family Medicine

## 2022-09-08 ENCOUNTER — Telehealth: Payer: Self-pay | Admitting: *Deleted

## 2022-09-08 ENCOUNTER — Encounter: Payer: Self-pay | Admitting: Family Medicine

## 2022-09-08 VITALS — BP 100/70 | HR 86 | Temp 98.8°F | Resp 16 | Ht 59.0 in | Wt 119.4 lb

## 2022-09-08 DIAGNOSIS — R1031 Right lower quadrant pain: Secondary | ICD-10-CM | POA: Diagnosis not present

## 2022-09-08 DIAGNOSIS — R748 Abnormal levels of other serum enzymes: Secondary | ICD-10-CM | POA: Diagnosis not present

## 2022-09-08 DIAGNOSIS — E039 Hypothyroidism, unspecified: Secondary | ICD-10-CM | POA: Diagnosis not present

## 2022-09-08 DIAGNOSIS — R1900 Intra-abdominal and pelvic swelling, mass and lump, unspecified site: Secondary | ICD-10-CM | POA: Diagnosis not present

## 2022-09-08 DIAGNOSIS — E559 Vitamin D deficiency, unspecified: Secondary | ICD-10-CM | POA: Diagnosis not present

## 2022-09-08 NOTE — Telephone Encounter (Signed)
Per MD request, RN placed call to pt regarding negative (Not Detected) recent [facility] testing.  No answer, LVM for pt to return call to the office. 

## 2022-09-08 NOTE — Progress Notes (Signed)
Subjective:   By signing my name below, I, Kimberly Miles, attest that this documentation has been prepared under the direction and in the presence of Ann Held, DO 09/08/22   Patient ID: Kimberly Miles, female    DOB: [birth year], 67 y.o.   MRN: [id number]  Chief Complaint  Patient presents with   Discuss imaging    HPI Patient is in today for an office visit.   She has concerns of elevated liver enzymes. She states that first her liver enzymes were only slightly elevated and [name] recently took labs and her liver enzymes had doubled. She has tried doing a liver detox.   She reports having an MRI in 11/2021 of that showed a right kidney cyst in the RUQ that has increased in size compared to prior imaging.  She has had a prior contrast MRI of her right breast in 08/25/2022.   She also endorses upper abdominal pain that at times radiates to the right upper abdomen. She adds that she experiences throbbing pain in her LUQ after eating. She states that this mainly occurs after dinner. She also states that she wakes up in the morning with gas. She has gained about 5 lbs.   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   IBS (irritable bowel syndrome)    Migraine    MTHFR gene mutation    Personal history of radiation therapy    PMB (postmenopausal bleeding)    PONV (postoperative nausea and  vomiting)    Post-surgical hypothyroidism     Past Surgical History:  Procedure Laterality Date   APPENDECTOMY  as child   BREAST BIOPSY Left 1979   benign   BREAST EXCISIONAL BIOPSY Left    BREAST LUMPECTOMY Right 01/2019   BREAST LUMPECTOMY WITH RADIOACTIVE SEED AND SENTINEL LYMPH NODE BIOPSY Right 02/04/2019   Procedure: RIGHT BREAST LUMPECTOMY WITH RADIOACTIVE SEED AND RIGHT AXILLARY DEEP SENTINEL LYMPH NODE BIOPSY WITH BLUE DYE INJECTION;  Surgeon: Fanny Skates, MD;  Location: [facility];  Service: General;  Laterality: Right;   BREAST REDUCTION SURGERY Bilateral 02/11/2019   Procedure: RIGHT BREAST ONCOPLASTIC RECONSTRUCTION, LEFT BREAST REDUCTION;  Surgeon: Irene Limbo, MD;  Location: [facility];  Service: Plastics;  Laterality: Bilateral;   CARDIOVASCULAR STRESS TEST  09-16-2013   normal nuclear study w/ no ischemia/  normal LV function and wall motion , ef 84%   CESAREAN SECTION  1977 and 1978   Bilateral Tubal Ligation w/ last c/s   COLPOSCOPY  05/2009   CIN 1   HYSTEROSCOPY WITH D & C N/A 12/23/2015   Procedure: DILATATION AND CURETTAGE /HYSTEROSCOPY ;  Surgeon: Megan Salon, MD;  Location: [facility];  Service: Gynecology;  Laterality:  N/A;   NEGATIVE SLEEP STUDY  2014  per pt   OOPHORECTOMY Right 1985   ruptured cyst    REDUCTION MAMMAPLASTY Bilateral 01/2019   THYROID LOBECTOMY Right 03-08-2011   TRANSTHORACIC ECHOCARDIOGRAM  05-09-2011   ef 64%/  mild MR and TR   TUBAL LIGATION      Family History  Problem Relation Age of Onset   Hypertension Maternal Grandmother    Uterine cancer Maternal Grandmother 50   Vascular Disease Maternal Grandmother    Lung cancer Maternal Grandmother 59   Stroke Maternal Grandfather    Prostate cancer Paternal Grandfather 84   Migraines Paternal Grandmother    Lung cancer Mother 68   Hypertension Mother 43   Hyperlipidemia Mother 8   Ehlers-Danlos syndrome Mother    Kidney  disease Mother    Fibromyalgia Mother    Other Father        bypass surgery times 2   Migraines Father    Osteoporosis Father    Prostate cancer Father    Kidney disease Maternal Aunt     Social History   Socioeconomic History   Marital status: Married    Spouse name: Not on file   Number of children: 2   Years of education: 12   Highest education level: Not on file  Occupational History   Occupation: Retired  Tobacco Use   Smoking status: Never   Smokeless tobacco: Never  Scientific laboratory technician Use: Never used  Substance and Sexual Activity   Alcohol use: Not Currently    Alcohol/week: 0.0 standard drinks of alcohol   Drug use: No   Sexual activity: Yes    Partners: Male    Birth control/protection: Post-menopausal    Comment: BTL done w/ last C/S  Other Topics Concern   Not on file  Social History Narrative   Lives at home w/ her husband   Right-handed   Caffeine: 1/4 cup per day   Social Determinants of Health   Financial Resource Strain: Not on file  Food Insecurity: Not on file  Transportation Needs: No Transportation Needs (12/24/2018)   PRAPARE - Hydrologist (Medical): No    Lack of Transportation (Non-Medical): No  Physical Activity: Not on file  Stress: Not on file  Social Connections: Not on file  Intimate Partner Violence: Not At Risk (12/24/2018)   Humiliation, Afraid, Rape, and Kick questionnaire    Fear of Current or Ex-Partner: No    Emotionally Abused: No    Physically Abused: No    Sexually Abused: No    Outpatient Medications Prior to Visit  Medication Sig Dispense Refill   Ascorbic Acid (VITAMIN C) 1000 MG tablet Take 1,000 mg by mouth daily. 2 tablet daily     Cholecalciferol (VITAMIN D3) 5000 UNITS CAPS Take 1 capsule by mouth daily.     zinc gluconate 50 MG tablet Take 15 mg by mouth daily.     ASTAXANTHIN PO Take by mouth. (Patient not taking: Reported on 01/12/2022)     exemestane (AROMASIN) 25 MG tablet  Take 1 tablet (25 mg total) by mouth daily after breakfast. (Patient not taking: Reported on 02/24/2022) 90 tablet 1   metaxalone (SKELAXIN) 800 MG tablet Take 0.5 tablets (400 mg total) by mouth 3 (three) times daily. (Patient not taking: Reported on 11/24/2021) 30 tablet 0   No facility-administered medications prior to visit.    Allergies  Allergen Reactions   Codeine Nausea Only  Demerol [Meperidine] Nausea And Vomiting   Erythromycin Other (See Comments)    Stomach upset   Sulfa Antibiotics Other (See Comments)    unknown reaction   Ampicillin Rash    She reports she also "saw stars"   Chlorhexidine Rash    Review of Systems  Gastrointestinal:  Positive for abdominal pain (upper -- accompanied by gas).       Objective:    Physical Exam  BP 100/70 (BP Location: Left Arm, Patient Position: Sitting, Cuff Size: Normal)   Pulse 86   Temp 98.8 F (37.1 C) (Oral)   Resp 16   Ht 4\' 11"  (1.499 m)   Wt 119 lb 6.4 oz (54.2 kg)   LMP 08/25/2010   SpO2 98%   BMI 24.12 kg/m  Wt Readings from Last 3 Encounters:  09/08/22 119 lb 6.4 oz (54.2 kg)  02/24/22 114 lb 8 oz (51.9 kg)  02/15/22 113 lb (51.3 kg)       Assessment & Plan:  Elevated liver enzymes -     Comprehensive metabolic panel -     CBC with Differential/Platelet  Abdominal mass, unspecified abdominal location -     Comprehensive metabolic panel -     CT ABDOMEN PELVIS W CONTRAST; Future -     CT CHEST W CONTRAST; Future -     CBC with Differential/Platelet  Right lower quadrant abdominal pain -     CT ABDOMEN PELVIS W CONTRAST; Future -     CT CHEST W CONTRAST; Future -     CBC with Differential/Platelet  Vitamin D deficiency -     VITAMIN D 25 Hydroxy (Vit-D Deficiency, Fractures)  Hypothyroidism, unspecified type -     TSH     I,Rachel Rivera,acting as a scribe for Ann Held, DO.,have documented all relevant documentation on the behalf of Ann Held, DO,as directed by  Ann Held, DO while in the presence of Ann Held, DO.   I, Kimberly Miles, personally preformed the services described in this documentation.  All medical record entries made by the scribe were at my direction and in my presence.  I have reviewed the chart and discharge instructions (if applicable) and agree that the record reflects my personal performance and is accurate and complete. 09/08/22   Kimberly Miles

## 2022-09-09 LAB — VITAMIN D 25 HYDROXY (VIT D DEFICIENCY, FRACTURES): Vit D, 25-Hydroxy: 36 ng/mL (ref 30–100)

## 2022-09-09 LAB — CBC WITH DIFFERENTIAL/PLATELET
Absolute Monocytes: 555 cells/uL (ref 200–950)
Basophils Absolute: 53 cells/uL (ref 0–200)
Basophils Relative: 0.7 %
Eosinophils Absolute: 99 cells/uL (ref 15–500)
Eosinophils Relative: 1.3 %
HCT: 41.3 % (ref 35.0–45.0)
Hemoglobin: 14.2 g/dL (ref 11.7–15.5)
Lymphs Abs: 2082 cells/uL (ref 850–3900)
MCH: 32 pg (ref 27.0–33.0)
MCHC: 34.4 g/dL (ref 32.0–36.0)
MCV: 93 fL (ref 80.0–100.0)
MPV: 12.4 fL (ref 7.5–12.5)
Monocytes Relative: 7.3 %
Neutro Abs: 4811 cells/uL (ref 1500–7800)
Neutrophils Relative %: 63.3 %
Platelets: 266 10*3/uL (ref 140–400)
RBC: 4.44 10*6/uL (ref 3.80–5.10)
RDW: 11.8 % (ref 11.0–15.0)
Total Lymphocyte: 27.4 %
WBC: 7.6 10*3/uL (ref 3.8–10.8)

## 2022-09-09 LAB — COMPREHENSIVE METABOLIC PANEL
AG Ratio: 2 (calc) (ref 1.0–2.5)
ALT: 28 U/L (ref 6–29)
AST: 22 U/L (ref 10–35)
Albumin: 4.4 g/dL (ref 3.6–5.1)
Alkaline phosphatase (APISO): 93 U/L (ref 37–153)
BUN/Creatinine Ratio: 9 (calc) (ref 6–22)
BUN: 11 mg/dL (ref 7–25)
CO2: 25 mmol/L (ref 20–32)
Calcium: 9 mg/dL (ref 8.6–10.4)
Chloride: 105 mmol/L (ref 98–110)
Creat: 1.19 mg/dL — ABNORMAL HIGH (ref 0.50–1.05)
Globulin: 2.2 g/dL (calc) (ref 1.9–3.7)
Glucose, Bld: 85 mg/dL (ref 65–99)
Potassium: 4.4 mmol/L (ref 3.5–5.3)
Sodium: 141 mmol/L (ref 135–146)
Total Bilirubin: 0.2 mg/dL (ref 0.2–1.2)
Total Protein: 6.6 g/dL (ref 6.1–8.1)

## 2022-09-09 LAB — TSH: TSH: 3.36 mIU/L (ref 0.40–4.50)

## 2022-09-10 DIAGNOSIS — R1900 Intra-abdominal and pelvic swelling, mass and lump, unspecified site: Secondary | ICD-10-CM | POA: Insufficient documentation

## 2022-09-10 DIAGNOSIS — R748 Abnormal levels of other serum enzymes: Secondary | ICD-10-CM | POA: Insufficient documentation

## 2022-09-14 ENCOUNTER — Ambulatory Visit (HOSPITAL_BASED_OUTPATIENT_CLINIC_OR_DEPARTMENT_OTHER)
Admission: RE | Admit: 2022-09-14 | Discharge: 2022-09-14 | Disposition: A | Discharge status: Home / Self Care | Payer: Medicare Other | Source: Ambulatory Visit | Attending: Family Medicine | Admitting: Family Medicine

## 2022-09-14 DIAGNOSIS — R1900 Intra-abdominal and pelvic swelling, mass and lump, unspecified site: Secondary | ICD-10-CM | POA: Insufficient documentation

## 2022-09-14 DIAGNOSIS — R1031 Right lower quadrant pain: Secondary | ICD-10-CM

## 2022-09-14 DIAGNOSIS — C3411 Malignant neoplasm of upper lobe, right bronchus or lung: Secondary | ICD-10-CM | POA: Diagnosis not present

## 2022-09-14 MED ORDER — IOHEXOL 300 MG/ML  SOLN
100.0000 mL | Freq: Once | INTRAMUSCULAR | Status: AC | PRN
  Administered 2022-09-14: 100 mL via INTRAVENOUS

## 2022-09-15 ENCOUNTER — Telehealth: Payer: Self-pay | Admitting: Family Medicine

## 2022-09-15 NOTE — Telephone Encounter (Signed)
Imaging has not been released by radiology yet

## 2022-09-15 NOTE — Telephone Encounter (Signed)
Patient would like a call back to go over imaging results before the weekend starts. Pt was informed that PCP has not gone over the results yet, and she will be contacted once Dr. Etter Sjogren goes over them. Please advise.

## 2022-09-19 DIAGNOSIS — H11153 Pinguecula, bilateral: Secondary | ICD-10-CM | POA: Diagnosis not present

## 2022-09-19 DIAGNOSIS — H04123 Dry eye syndrome of bilateral lacrimal glands: Secondary | ICD-10-CM | POA: Diagnosis not present

## 2022-09-19 DIAGNOSIS — D3132 Benign neoplasm of left choroid: Secondary | ICD-10-CM | POA: Diagnosis not present

## 2022-09-19 DIAGNOSIS — H5711 Ocular pain, right eye: Secondary | ICD-10-CM | POA: Diagnosis not present

## 2022-09-19 DIAGNOSIS — H25813 Combined forms of age-related cataract, bilateral: Secondary | ICD-10-CM | POA: Diagnosis not present

## 2022-12-11 ENCOUNTER — Telehealth: Payer: Self-pay | Admitting: *Deleted

## 2022-12-11 LAB — SIGNATERA
SIGNATERA MTM READOUT: 0 MTM/ml
SIGNATERA TEST RESULT: NEGATIVE

## 2022-12-11 NOTE — Telephone Encounter (Signed)
Per MD request, RN placed call to pt regarding negative (Not Detected) recent [employer] testing.  Pt appreciative of call and verbalized understanding.  

## 2022-12-15 ENCOUNTER — Ambulatory Visit (INDEPENDENT_AMBULATORY_CARE_PROVIDER_SITE_OTHER): Payer: Medicare Other | Admitting: Family Medicine

## 2022-12-15 ENCOUNTER — Encounter: Payer: Self-pay | Admitting: Family Medicine

## 2022-12-15 VITALS — BP 110/60 | HR 77 | Temp 98.4°F | Resp 18 | Ht 59.0 in | Wt 118.8 lb

## 2022-12-15 DIAGNOSIS — S40022A Contusion of left upper arm, initial encounter: Secondary | ICD-10-CM | POA: Diagnosis not present

## 2022-12-15 NOTE — Assessment & Plan Note (Signed)
Warm compresses Consider arnica  Return to office if symptoms worsen or do not improve
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
## 2022-12-19 ENCOUNTER — Encounter: Payer: Self-pay | Admitting: Family Medicine

## 2022-12-19 ENCOUNTER — Ambulatory Visit (HOSPITAL_BASED_OUTPATIENT_CLINIC_OR_DEPARTMENT_OTHER)
Admission: RE | Admit: 2022-12-19 | Discharge: 2022-12-19 | Disposition: A | Discharge status: Home / Self Care | Payer: Medicare Other | Source: Ambulatory Visit | Attending: Family Medicine | Admitting: Family Medicine

## 2022-12-19 ENCOUNTER — Other Ambulatory Visit: Payer: Self-pay | Admitting: Family Medicine

## 2022-12-19 DIAGNOSIS — M7989 Other specified soft tissue disorders: Secondary | ICD-10-CM | POA: Diagnosis not present

## 2022-12-19 DIAGNOSIS — M25522 Pain in left elbow: Secondary | ICD-10-CM | POA: Insufficient documentation

## 2022-12-19 DIAGNOSIS — M79602 Pain in left arm: Secondary | ICD-10-CM | POA: Diagnosis not present

## 2022-12-25 ENCOUNTER — Ambulatory Visit (INDEPENDENT_AMBULATORY_CARE_PROVIDER_SITE_OTHER): Payer: Medicare Other | Admitting: Family Medicine

## 2022-12-25 ENCOUNTER — Other Ambulatory Visit: Payer: Self-pay

## 2022-12-25 VITALS — BP 116/76 | HR 76 | Ht 59.0 in | Wt 121.0 lb

## 2022-12-25 DIAGNOSIS — S40022A Contusion of left upper arm, initial encounter: Secondary | ICD-10-CM
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
## 2022-12-25 NOTE — Patient Instructions (Signed)
Thank you for coming in today.   I can add OT.   Try compression.   I recommend you obtained a compression sleeve to help with your joint problems. There are many options on the market however I recommend obtaining a full elbow Body Helix compression sleeve.  You can find information (including how to appropriate measure yourself for sizing) can be found at www.[employer].ch.  Many of these products are health savings account (HSA) eligible.   You can use the compression sleeve at any time throughout the day but is most important to use while being active as well as for 2 hours post-activity.   It is appropriate to ice following activity with the compression sleeve in place.

## 2023-01-17 ENCOUNTER — Other Ambulatory Visit: Payer: Self-pay | Admitting: Hematology and Oncology

## 2023-01-17 DIAGNOSIS — Z1231 Encounter for screening mammogram for malignant neoplasm of breast: Secondary | ICD-10-CM

## 2023-01-22 ENCOUNTER — Ambulatory Visit (HOSPITAL_BASED_OUTPATIENT_CLINIC_OR_DEPARTMENT_OTHER): Payer: Medicare Other | Admitting: Obstetrics & Gynecology

## 2023-01-25 ENCOUNTER — Ambulatory Visit (INDEPENDENT_AMBULATORY_CARE_PROVIDER_SITE_OTHER): Payer: Medicare Other | Admitting: Family Medicine

## 2023-01-25 ENCOUNTER — Other Ambulatory Visit: Payer: Self-pay

## 2023-01-25 VITALS — BP 110/64 | HR 78 | Ht 59.0 in | Wt 116.0 lb

## 2023-01-25 DIAGNOSIS — M79652 Pain in left thigh: Secondary | ICD-10-CM | POA: Diagnosis not present

## 2023-01-25 MED ORDER — PREDNISONE 50 MG PO TABS: 50.0000 mg | ORAL_TABLET | Freq: Every day | ORAL | 0 refills | Status: DC

## 2023-01-25 NOTE — Patient Instructions (Addendum)
Thank you for coming in today.   The Askling L Protocol for Hamstring Strains  Leeroy Cha PT  We can add PT if needed.   Take the prednisone if worsening.   Let me know on PT.

## 2023-01-25 NOTE — Progress Notes (Signed)
   Rubin Payor, PhD, LAT, ATC acting as a scribe for Clementeen Graham, MD.  Kimberly Miles is a 67 y.o. female who presents to [facility] today for L leg pain.Pt was previously seen by Dr. Denyse Amass on 12/25/22 for L arm pain injured wake surfing.  Today, pt c/o L leg pain. Injury occurred Wednesday and then worsened Sunday. She was wake surfing and was trying to get up from a bent position, Pt locates pain to the distal portion of her L posterior thigh.   Swelling: yes-  Treatments tried: heat, ice, compression sleeve,    Pertinent review of systems: No fevers or chills  Relevant historical information: History of breast cancer   Exam:  BP 110/64   Pulse 78   Ht 4\' 11"  (1.499 m)   Wt 116 lb (52.6 kg)   LMP 08/25/2010   SpO2 98%   BMI 23.43 kg/m  General: Well Developed, well nourished, and in no acute distress.   MSK: Left thigh normal-appearing Tender palpation distal lateral biceps. No palpable defects or masses are present. Normal knee and hip motion. Some pain present with resisted knee flexion. Intact strength. Pulses cap refill and sensation are intact distally.  L-spine nontender normal motion intact strength negative slump and right straight leg raise test.    Lab and Radiology Results  [facility] MSK Ultrasound of: Left posterior thigh Distal lateral hamstring muscle tendinous junction biceps femoris.  No definitive defect is present however there is some hypoechoic fluid tracking along the distal biceps femoris muscle and tendon Impression: Strain hamstring     Assessment and Plan: 67 y.o. female with left thigh pain occurring during a wake surfing injury.  I think she strained her hamstring while trying to be pulled upright on a wake surfboard.  Home exercise program reviewed including Askling L protocol.  Recommend Voltaren gel and compression sleeve. If not improved consider physical therapy.  This is the  second injury occurring with wake surfing in the last month or so.  She could have PT for both issues if needed.  Prednisone prescribed for use if worsening.  PDMP not reviewed this encounter. Orders Placed This Encounter  Procedures   [place] LIMITED JOINT SPACE STRUCTURES LOW LEFT(NO LINKED CHARGES)    Order Specific Question:   Reason for Exam (SYMPTOM  OR DIAGNOSIS REQUIRED)    Answer:   left thigh pain    Order Specific Question:   Preferred imaging location?    Answer:   [facility] ordered this encounter  Medications   predniSONE (DELTASONE) 50 MG tablet    Sig: Take 1 tablet (50 mg total) by mouth daily.    Dispense:  5 tablet    Refill:  0     Discussed warning signs or symptoms. Please see discharge instructions. Patient expresses understanding.   The above documentation has been reviewed and is accurate and complete Clementeen Graham, M.D.

## 2023-01-29 ENCOUNTER — Ambulatory Visit: Payer: Medicare Other | Admitting: Family Medicine

## 2023-02-20 ENCOUNTER — Other Ambulatory Visit (HOSPITAL_COMMUNITY)
Admission: RE | Admit: 2023-02-20 | Discharge: 2023-02-20 | Disposition: A | Discharge status: Home / Self Care | Payer: Medicare Other | Source: Ambulatory Visit | Attending: Obstetrics & Gynecology | Admitting: Obstetrics & Gynecology

## 2023-02-20 ENCOUNTER — Ambulatory Visit (INDEPENDENT_AMBULATORY_CARE_PROVIDER_SITE_OTHER): Payer: Medicare Other | Admitting: Obstetrics & Gynecology

## 2023-02-20 ENCOUNTER — Encounter (HOSPITAL_BASED_OUTPATIENT_CLINIC_OR_DEPARTMENT_OTHER): Payer: Self-pay | Admitting: Obstetrics & Gynecology

## 2023-02-20 VITALS — BP 132/72 | HR 64 | Ht 58.5 in | Wt 113.8 lb

## 2023-02-20 DIAGNOSIS — C50311 Malignant neoplasm of lower-inner quadrant of right female breast: Secondary | ICD-10-CM

## 2023-02-20 DIAGNOSIS — Z170 Estrogen receptor positive status [ER+]: Secondary | ICD-10-CM | POA: Diagnosis not present

## 2023-02-20 DIAGNOSIS — M810 Age-related osteoporosis without current pathological fracture: Secondary | ICD-10-CM | POA: Diagnosis not present

## 2023-02-20 DIAGNOSIS — Z9189 Other specified personal risk factors, not elsewhere classified: Secondary | ICD-10-CM

## 2023-02-20 DIAGNOSIS — Z1211 Encounter for screening for malignant neoplasm of colon: Secondary | ICD-10-CM

## 2023-02-20 DIAGNOSIS — N898 Other specified noninflammatory disorders of vagina: Secondary | ICD-10-CM | POA: Diagnosis not present

## 2023-02-20 NOTE — Progress Notes (Signed)
67 y.o. G2P2 Married Declined female here for breast and pelvic exam for breast and pelvic exam due to hx of breast cancer 11/2018.  T2N0 Stage 1B.  Treated with lumpectomy and radiation.  Had side effects with anastrozole so has stopped.  MMGs and MRIs as well.  Followed by Dr. Pamelia Hoit.  Has follow up in September.    Had had a lot of injuries this past year that seemed to have started with going wake surfing.    Denies vaginal bleeding.  Did have some vaginal odor last year.  This lasted for several months.  Did some dietary elimination and has cut out diary, gluten, and eggs and this helped.  Will check for vaginitis today.  Patient's last menstrual period was 08/25/2010.          Sexually active: Yes.    H/O STD:  no  Health Maintenance: PCP:  Dr. Zola Button.   Vaccines are up to date:  reveiwed Colonoscopy:  04/03/2005, 2022 MMG:  03/02/2022 Negative.  Scheduled 03/05/2023.  Had MRI 08/2022. BMD:  08/29/2021 Last pap smear:  02/15/2022 Negative.      reports that she has never smoked. She has never used smokeless tobacco. She reports that she does not currently use alcohol. She reports that she does not use drugs.  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   IBS (irritable bowel syndrome)    Migraine    MTHFR gene mutation    Personal history of radiation therapy    PMB (postmenopausal bleeding)    PONV  (postoperative nausea and vomiting)    Post-surgical hypothyroidism     Past Surgical History:  Procedure Laterality Date   APPENDECTOMY  as child   BREAST BIOPSY Left 1979   benign   BREAST EXCISIONAL BIOPSY Left    BREAST LUMPECTOMY Right 01/2019   BREAST LUMPECTOMY WITH RADIOACTIVE SEED AND SENTINEL LYMPH NODE BIOPSY Right 02/04/2019   Procedure: RIGHT BREAST LUMPECTOMY WITH RADIOACTIVE SEED AND RIGHT AXILLARY DEEP SENTINEL LYMPH NODE BIOPSY WITH BLUE DYE INJECTION;  Surgeon: Claud Kelp, MD;  Location: [facility];  Service: General;  Laterality: Right;   BREAST REDUCTION SURGERY Bilateral 02/11/2019   Procedure: RIGHT BREAST ONCOPLASTIC RECONSTRUCTION, LEFT BREAST REDUCTION;  Surgeon: Glenna Fellows, MD;  Location: [facility];  Service: Plastics;  Laterality: Bilateral;   CARDIOVASCULAR STRESS TEST  09-16-2013   normal nuclear study w/ no ischemia/  normal LV function and wall motion , ef 84%   CESAREAN SECTION  1977 and 1978   Bilateral Tubal Ligation w/ last c/s   COLPOSCOPY  05/2009   CIN 1   HYSTEROSCOPY WITH D & C N/A 12/23/2015   Procedure: DILATATION AND CURETTAGE /HYSTEROSCOPY ;  Surgeon: Jerene Bears,  MD;  Location: [facility];  Service: Gynecology;  Laterality: N/A;   NEGATIVE SLEEP STUDY  2014  per pt   OOPHORECTOMY Right 1985   ruptured cyst    REDUCTION MAMMAPLASTY Bilateral 01/2019   THYROID LOBECTOMY Right 03-08-2011   TRANSTHORACIC ECHOCARDIOGRAM  05-09-2011   ef 64%/  mild MR and TR   TUBAL LIGATION      Current Outpatient Medications  Medication Sig Dispense Refill   Ascorbic Acid (VITAMIN C) 1000 MG tablet Take 1,000 mg by mouth daily. 2 tablet daily     Cholecalciferol (VITAMIN D3) 5000 UNITS CAPS Take 1 capsule by mouth daily.     zinc gluconate 50 MG tablet Take 15 mg by mouth daily.     metaxalone (SKELAXIN) 800 MG tablet Take 0.5 tablets (400 mg total) by mouth 3 (three) times daily. (Patient not  taking: Reported on 11/24/2021) 30 tablet 0   No current facility-administered medications for this visit.    Family History  Problem Relation Age of Onset   Hypertension Maternal Grandmother    Uterine cancer Maternal Grandmother 49   Vascular Disease Maternal Grandmother    Lung cancer Maternal Grandmother 71   Stroke Maternal Grandfather    Prostate cancer Paternal Grandfather 45   Migraines Paternal Grandmother    Lung cancer Mother 72   Hypertension Mother 37   Hyperlipidemia Mother 103   Ehlers-Danlos syndrome Mother    Kidney disease Mother    Fibromyalgia Mother    Other Father        bypass surgery times 2   Migraines Father    Osteoporosis Father    Prostate cancer Father    Kidney disease Maternal Aunt     Review of Systems  Constitutional: Negative.   Genitourinary: Negative.     Exam:   BP 132/72 (BP Location: Right Arm, Patient Position: Sitting, Cuff Size: Normal)   Pulse 64   Ht 4' 10.5" (1.486 m)   Wt 113 lb 12.8 oz (51.6 kg)   LMP 08/25/2010   BMI 23.38 kg/m   Height: 4' 10.5" (148.6 cm)  General appearance: alert, cooperative and appears stated age Breasts: normal appearance, no masses or tenderness, well healed scar on right Abdomen: soft, non-tender; bowel sounds normal; no masses,  no organomegaly Lymph nodes: Cervical, supraclavicular, and axillary nodes normal.  No abnormal inguinal nodes palpated Neurologic: Grossly normal  Pelvic: External genitalia:  no lesions              Urethra:  normal appearing urethra with no masses, tenderness or lesions              Bartholins and Skenes: normal                 Vagina: normal appearing vagina with atrophic changes and no discharge, no lesions              Cervix: no lesions              Pap taken: No. Bimanual Exam:  Uterus:  normal size, contour, position, consistency, mobility, non-tender              Adnexa: normal adnexa and no mass, fullness, tenderness               Rectovaginal:  Confirms               Anus:  normal sphincter tone, no lesions  Chaperone, Ina Homes, CMA, was present for exam.  Assessment/Plan: 1. GYN exam for high-risk Medicare patient - Pap smear 02/15/2022 - Mammogram 03/02/2022, scheduled 03/05/2023 - Colonoscopy 2006.  Cologuard negative 2022. - Bone mineral density 08/29/2021 - lab work done with PCP, Dr. Zola Button - vaccines reviewed/updated  2. Vaginal odor - will check for vaginitis - Cervicovaginal ancillary only( [place])  3. Colon cancer screening - Cologuard  4. Age-related osteoporosis without current pathological fracture - declines medication again but willing to repeat 1-2  years  5. 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 Pamelia Hoit

## 2023-02-21 DIAGNOSIS — M810 Age-related osteoporosis without current pathological fracture: Secondary | ICD-10-CM | POA: Diagnosis not present

## 2023-02-21 LAB — CERVICOVAGINAL ANCILLARY ONLY
Bacterial Vaginitis (gardnerella): NEGATIVE
Comment: NEGATIVE

## 2023-02-28 DIAGNOSIS — M810 Age-related osteoporosis without current pathological fracture: Secondary | ICD-10-CM | POA: Diagnosis not present

## 2023-03-05 ENCOUNTER — Ambulatory Visit
Admission: RE | Admit: 2023-03-05 | Discharge: 2023-03-05 | Disposition: A | Discharge status: Home / Self Care | Payer: Medicare Other | Source: Ambulatory Visit | Attending: Hematology and Oncology | Admitting: Hematology and Oncology

## 2023-03-05 DIAGNOSIS — Z1231 Encounter for screening mammogram for malignant neoplasm of breast: Secondary | ICD-10-CM

## 2023-03-06 ENCOUNTER — Inpatient Hospital Stay: Payer: Medicare Other | Attending: Hematology and Oncology | Admitting: Hematology and Oncology

## 2023-03-06 VITALS — BP 119/66 | HR 73 | Temp 97.8°F | Resp 18 | Ht 58.5 in | Wt 112.6 lb

## 2023-03-06 DIAGNOSIS — Z170 Estrogen receptor positive status [ER+]: Secondary | ICD-10-CM | POA: Insufficient documentation

## 2023-03-06 DIAGNOSIS — I890 Lymphedema, not elsewhere classified: Secondary | ICD-10-CM | POA: Diagnosis not present

## 2023-03-06 DIAGNOSIS — Z923 Personal history of irradiation: Secondary | ICD-10-CM | POA: Diagnosis not present

## 2023-03-06 DIAGNOSIS — C50311 Malignant neoplasm of lower-inner quadrant of right female breast: Secondary | ICD-10-CM | POA: Diagnosis not present

## 2023-03-06 DIAGNOSIS — M810 Age-related osteoporosis without current pathological fracture: Secondary | ICD-10-CM | POA: Insufficient documentation

## 2023-03-06 NOTE — Assessment & Plan Note (Addendum)
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  ER PR positive HER-2 negative, Ki-67 50%   Adjuvant radiation 03/25/2019-04/13/20 ----------------------------------------------------------------- Treatment plan: Adjuvant antiestrogen therapy with anastrozole 12/18/2019 discontinued because of continued muscle aches and pains Switched to letrozole 02/25/2020 half a tablet daily, switched to exemestane 01/24/21   Exemestane toxicities: Tolerating it extremely well currently.   The treatment was held because of elevated liver function with the suspicion that it may be causing the elevation of LFTs. 09/08/2022: LFTs: Normal Therefore she discontinued exemestane   Breast cancer surveillance:  1. Mammogram 01/06/2021 no mammographic evidence of malignancy breast density category B 2. Breast MRI 08/25/2022 benign breast density category B:(will obtain annually because her breast cancer was only detected through an MRI), T2 bright mass in the left liver Upper quadrant 3.  CT chest abdomen pelvis 09/17/2022: Benign cysts in the liver 4. Bone density 12/02/2019; T score -3 (takes osteostrong)   Osteoporosis: Patient wants to try osteogenic's exercise/specialized equipment program to improve bone density.    We will order a breast MRI to be done in 6 months From next year onwards we will change her regular mammogram to contrast-enhanced mammogram so that we can avoid doing these MRIs.  Signatera testing: Negative  Return to clinic in 1 year for follow-up

## 2023-03-06 NOTE — Progress Notes (Signed)
Patient Care Team: Zola Button, Grayling Congress, DO as PCP - General (Family Medicine) Santiago Glad, MD as Referring Physician (Specialist) Douglass Rivers, MD as Consulting Physician (Obstetrics and Gynecology) Serena Colonel, MD as Consulting Physician (Otolaryngology) Jerene Bears, MD as Consulting Physician (Gynecology) Serena Croissant, MD as Consulting Physician (Hematology and Oncology) Lonie Peak, MD as Attending Physician (Radiation Oncology) Glenna Fellows, MD as Consulting Physician (Plastic Surgery) Claud Kelp, MD as Consulting Physician (General Surgery)  DIAGNOSIS:  Encounter Diagnosis  Name Primary?   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   02/04/2019 Cancer Staging   Staging form: Breast, AJCC 8th Edition - Pathologic stage from 02/04/2019: Stage IA (pT2, pN0, cM0, G1, ER+, PR+, HER2-, Oncotype DX score: 18) - Signed by Loa Socks, NP on 02/19/2019   02/11/2019 Surgery   Bil Mammaplasty: benign   03/24/2019 - 04/14/2019 Radiation Therapy   The patient initially received a dose of 42.56 Gy in 16 fractions to the breast using whole-breast tangent fields. This was delivered using a 3-D  conformal technique. The total dose was 42.56 Gy.    Oncotype testing   The Oncotype DX score was 18 predicting a risk of outside the breast recurrence over the next 9 years of 5 % if the patient's only systemic therapy is tamoxifen for 5 years.   08/06/2019 Genetic Testing   Negative genetic testing on the common hereditary cancer panel.  The Common Hereditary Gene Panel offered by [facility] includes sequencing and/or deletion duplication testing of the following 48 genes: APC, ATM, AXIN2, BARD1, BMPR1A, BRCA1, BRCA2, BRIP1, CDH1, CDK4, CDKN2A (p14ARF), CDKN2A (p16INK4a), CHEK2, CTNNA1, DICER1, EPCAM (Deletion/duplication testing only), GREM1 (promoter region deletion/duplication testing only), KIT, MEN1, MLH1, MSH2, MSH3, MSH6, MUTYH, NBN, NF1, NHTL1, PALB2, PDGFRA, PMS2, POLD1, POLE, PTEN, RAD50, RAD51C, RAD51D, RNF43, SDHB, SDHC, SDHD, SMAD4, SMARCA4. STK11, TP53, TSC1, TSC2, and VHL.  The following genes were evaluated for sequence changes only: SDHA and HOXB13 c.251G>A variant only. The report date is August 06, 2019.     CHIEF COMPLIANT: Surveillance  INTERVAL HISTORY: Kimberly Miles is a 67 y.o. female with above-mentioned history of right breast cancer. Patient reports that her health is doing good. No pain or discomfort. She says it does stay sore from the radiation and still experiencing some lymphedema. She is working on her diet and trying to resume her exercise back.   ALLERGIES:  is allergic to codeine, demerol [meperidine], erythromycin, sulfa antibiotics, ampicillin, and chlorhexidine.  MEDICATIONS:  Current Outpatient Medications  Medication Sig Dispense Refill   Ascorbic Acid (VITAMIN C) 1000 MG tablet Take 1,000 mg by mouth daily. 2 tablet daily     Cholecalciferol (VITAMIN D3) 5000 UNITS CAPS Take  1 capsule by mouth daily.     zinc gluconate 50 MG tablet Take 15 mg by mouth daily.     No current facility-administered medications for this visit.    PHYSICAL  EXAMINATION: ECOG PERFORMANCE STATUS: 1 - Symptomatic but completely ambulatory  Vitals:   03/06/23 0926  BP: 119/66  Pulse: 73  Resp: 18  Temp: 97.8 F (36.6 C)  SpO2: 100%   Filed Weights   03/06/23 0926  Weight: 112 lb 9.6 oz (51.1 kg)    BREAST: No palpable masses or nodules in either right or left breasts. No palpable axillary supraclavicular or infraclavicular adenopathy no breast tenderness or nipple discharge. (exam performed in the presence of a chaperone)  LABORATORY DATA:  I have reviewed the data as listed    Latest Ref Rng & Units 09/08/2022    3:21 PM 02/24/2022   10:12 AM 12/15/2021    9:44 AM  CMP  Glucose 65 - 99 mg/dL 85  97  93   BUN 7 - 25 mg/dL 11  13  13    Creatinine 0.50 - 1.05 mg/dL 1.61  0.96  0.45   Sodium 135 - 146 mmol/L 141  140  140   Potassium 3.5 - 5.3 mmol/L 4.4  4.0  5.6 No hemolysis seen   Chloride 98 - 110 mmol/L 105  105  104   CO2 20 - 32 mmol/L 25  31  30    Calcium 8.6 - 10.4 mg/dL 9.0  9.4  9.9   Total Protein 6.1 - 8.1 g/dL 6.6  6.7  6.7   Total Bilirubin 0.2 - 1.2 mg/dL 0.2  0.5  0.8   Alkaline Phos 38 - 126 U/L  109  91   AST 10 - 35 U/L 22  73  36   ALT 6 - 29 U/L 28  117  56     Lab Results  Component Value Date   WBC 7.6 09/08/2022   HGB 14.2 09/08/2022   HCT 41.3 09/08/2022   MCV 93.0 09/08/2022   PLT 266 09/08/2022   NEUTROABS 4,811 09/08/2022    ASSESSMENT & PLAN:  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  ER PR positive HER-2 negative, Ki-67 50%   Adjuvant radiation 03/25/2019-04/13/20 ----------------------------------------------------------------- Treatment plan: Adjuvant antiestrogen therapy with anastrozole 12/18/2019 discontinued because of continued muscle aches and pains Switched to letrozole 02/25/2020 half a tablet daily, switched to exemestane 01/24/21   Exemestane toxicities: Tolerating it extremely well currently.   The treatment was held because of elevated liver function with the suspicion that it may be causing the elevation of LFTs. 09/08/2022: LFTs: Normal Therefore she discontinued exemestane   Breast cancer surveillance:  1. Mammogram 01/06/2021 no mammographic evidence of malignancy breast density category B 2. Breast MRI 08/25/2022 benign breast density category B:(will obtain annually because her breast cancer was only detected through an MRI), T2 bright mass in the left liver Upper quadrant 3.  CT chest abdomen pelvis 09/17/2022: Benign cysts in the liver 4.  Bone density 12/02/2019; T score -3 (takes osteostrong)   Osteoporosis: Patient wants to try osteogenic's exercise/specialized equipment program to improve bone density.    We will order a breast MRI to be done in 6 months From next year onwards we will change her regular mammogram to contrast-enhanced mammogram so that we can avoid doing these MRIs.  Signatera testing: Negative  Return to clinic in 1 year for follow-up     Orders Placed This Encounter  Procedures   MR BREAST BILATERAL W WO CONTRAST INC CAD    Standing Status:   Future    Standing Expiration Date:   03/05/2024    Order Specific Question:   If indicated for the ordered procedure, I authorize the administration of contrast media per Radiology protocol    Answer:   Yes    Order Specific Question:   What is the patient's sedation requirement?    Answer:   No Sedation    Order Specific Question:   Does the patient have a pacemaker or  implanted devices?    Answer:   No    Order Specific Question:   Preferred imaging location?    Answer:   [facility] (table limit-550lbs)    Order Specific Question:   Release to patient    Answer:   Immediate   MM 2D DIAG BILAT WITH CONTRAST BCG ONLY    Standing Status:   Future    Standing Expiration Date:   03/05/2024    Order Specific Question:   Reason for Exam (SYMPTOM  OR DIAGNOSIS REQUIRED)    Answer:   High Risk    Order Specific Question:   If indicated for the ordered procedure, I authorize the administration of contrast media per Radiology protocol    Answer:   Yes    Order Specific Question:   Does the patient have a contrast media/X-ray dye allergy?    Answer:   No    Order Specific Question:   Preferred Imaging Location?    Answer:   [facility]    Order Specific Question:   Release to patient    Answer:   Immediate   The patient has a good understanding of the overall plan. she agrees with it. she will call with any problems that may develop before the next visit here. Total time spent: 30 mins including face to face time and time spent for planning, charting and co-ordination of care   Tamsen Meek, MD 03/06/23    I Janan Ridge am acting as a Neurosurgeon for [employer]  I have reviewed the above documentation for accuracy and completeness, and I agree with the above.

## 2023-03-12 DIAGNOSIS — M810 Age-related osteoporosis without current pathological fracture: Secondary | ICD-10-CM | POA: Diagnosis not present

## 2023-03-27 DIAGNOSIS — D2271 Melanocytic nevi of right lower limb, including hip: Secondary | ICD-10-CM | POA: Diagnosis not present

## 2023-03-27 DIAGNOSIS — Z85828 Personal history of other malignant neoplasm of skin: Secondary | ICD-10-CM | POA: Diagnosis not present

## 2023-03-27 DIAGNOSIS — L821 Other seborrheic keratosis: Secondary | ICD-10-CM | POA: Diagnosis not present

## 2023-03-27 DIAGNOSIS — L578 Other skin changes due to chronic exposure to nonionizing radiation: Secondary | ICD-10-CM | POA: Diagnosis not present

## 2023-03-27 DIAGNOSIS — L720 Epidermal cyst: Secondary | ICD-10-CM | POA: Diagnosis not present

## 2023-03-27 DIAGNOSIS — D485 Neoplasm of uncertain behavior of skin: Secondary | ICD-10-CM | POA: Diagnosis not present

## 2023-03-27 DIAGNOSIS — L814 Other melanin hyperpigmentation: Secondary | ICD-10-CM | POA: Diagnosis not present

## 2023-03-27 DIAGNOSIS — L570 Actinic keratosis: Secondary | ICD-10-CM | POA: Diagnosis not present

## 2023-03-27 DIAGNOSIS — L820 Inflamed seborrheic keratosis: Secondary | ICD-10-CM | POA: Diagnosis not present

## 2023-03-27 DIAGNOSIS — D1801 Hemangioma of skin and subcutaneous tissue: Secondary | ICD-10-CM | POA: Diagnosis not present

## 2023-03-27 DIAGNOSIS — L918 Other hypertrophic disorders of the skin: Secondary | ICD-10-CM | POA: Diagnosis not present

## 2023-03-30 ENCOUNTER — Telehealth: Payer: Self-pay

## 2023-03-30 NOTE — Telephone Encounter (Signed)
Called pt per MD to advise [facility] testing was negative/not detected. Pt verbalized understanding of results and knows [facility] will be in touch to schedule 3 mo repeat lab.   

## 2023-04-02 ENCOUNTER — Encounter: Payer: Self-pay | Admitting: Hematology and Oncology

## 2023-05-30 IMAGING — MR MR BREAST BILAT WO/W CM
8 of 12 series · 33 of 48 positions shown · IV contrast (gadavist)
Comparison: Previous exam(s).

CLINICAL DATA: 65-year-old female with history of right breast
lumpectomy for grade 2 invasive ductal carcinoma in the right breast
diagnosed in Sunday November, 2018. She had a benign biopsy of a fibroadenoma
in Tuesday December, 2018 under MRI guidance.

EXAM:
BILATERAL BREAST MRI WITH AND WITHOUT CONTRAST
TECHNIQUE: Multiplanar, multisequence MR images of both breasts were obtained
prior to and following the intravenous administration of 5 ml of
Gadavist

[Series 2: t2_tirm_tra ipat (a-p) · axial · 3.0mm · 0.70mm/px · 1 of 55 slices shown]
[im 1/55]
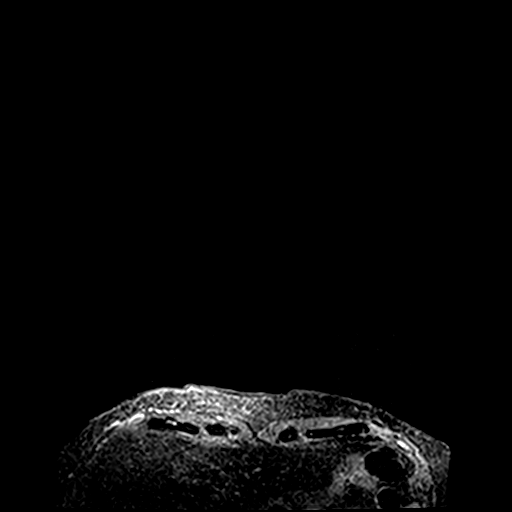

[Series 3: fl3d pre-cm no · axial · non-contrast · 1.2mm · 0.94mm/px · z∈[-100,+71]mm · 5 of 144 slices shown]
[im 1/144]
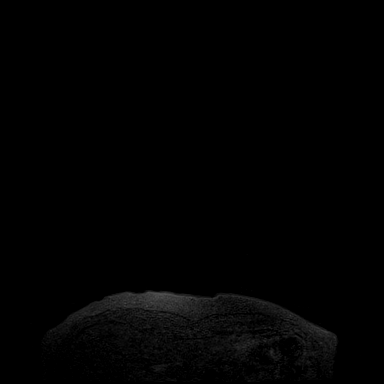
[im 36/144]
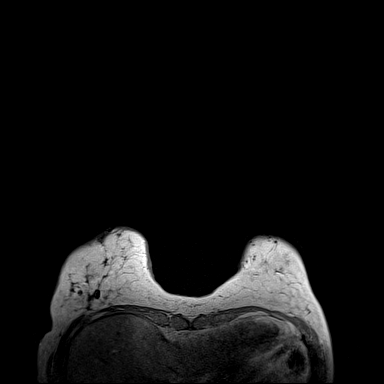
[im 72/144]
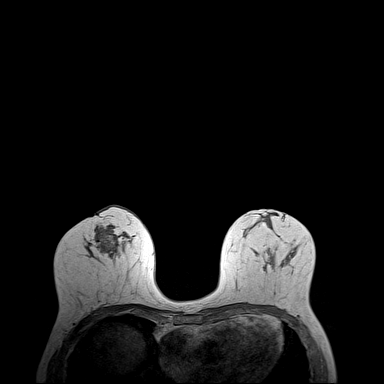
[im 108/144]
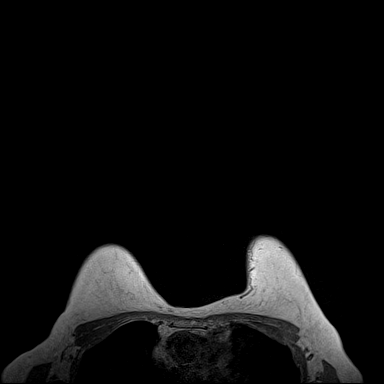
[im 144/144]
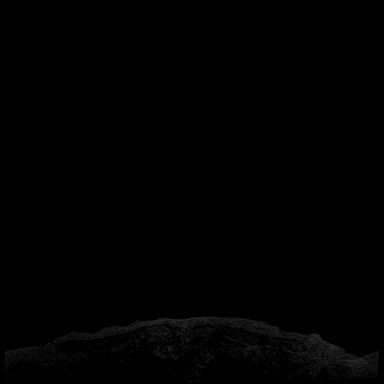

[Series 4: fl3d pre-cm · axial · non-contrast · 1.2mm · 0.94mm/px · z∈[-100,+71]mm · 5 of 144 slices shown]
[im 1/144]
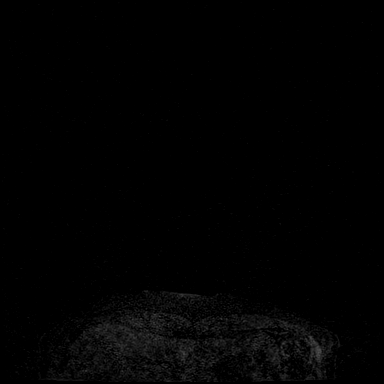
[im 36/144]
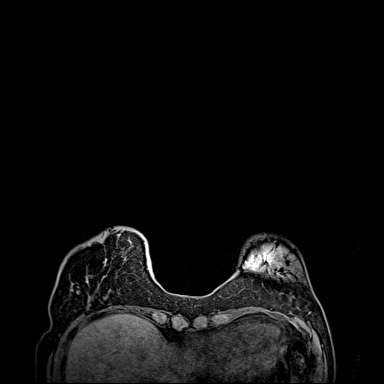
[im 72/144]
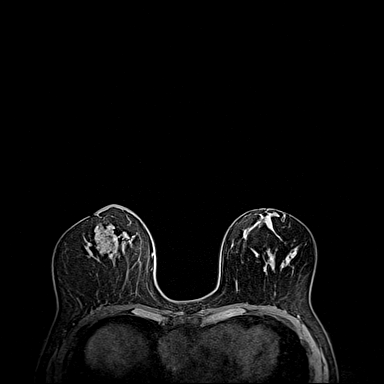
[im 108/144]
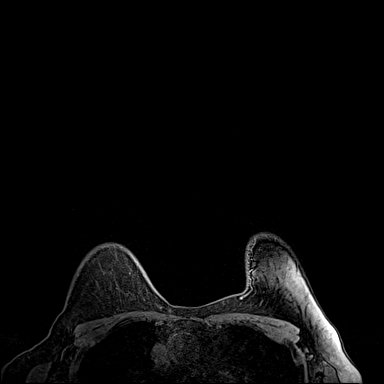
[im 144/144]
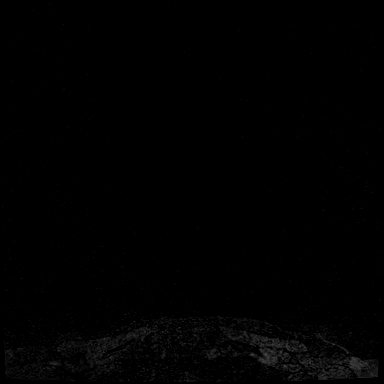

[Series 5: fl3d post immediate · axial · 1.2mm · 0.94mm/px · z∈[-100,+71]mm · 5 of 144 slices shown (1 of 3)]
[im 1/144]
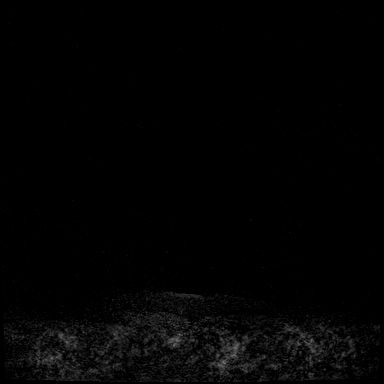
[im 36/144]
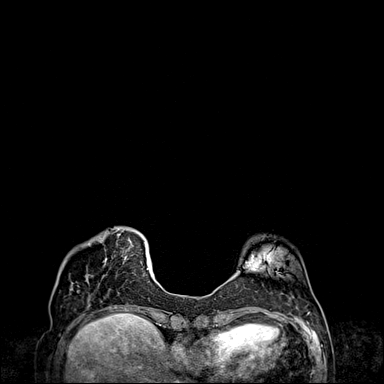
[im 72/144]
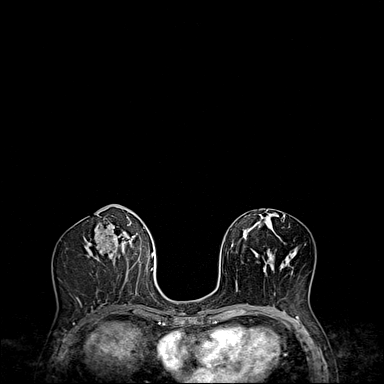
[im 108/144]
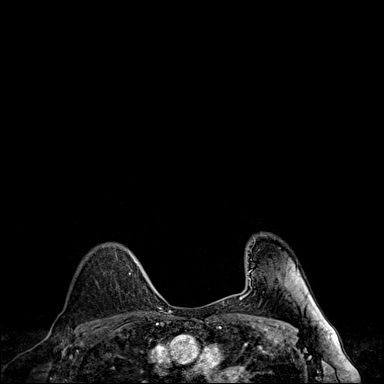
[im 144/144]
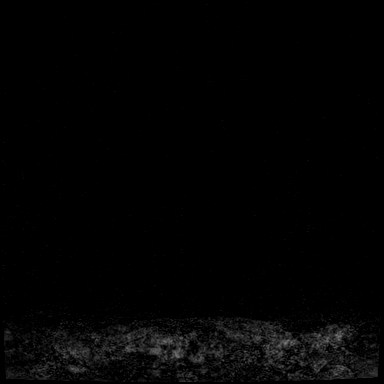

[Series 6: fl3d post immediate · axial · 1.2mm · 0.94mm/px · z∈[-100,+71]mm · 5 of 144 slices shown (2 of 3)]
[im 1/144]
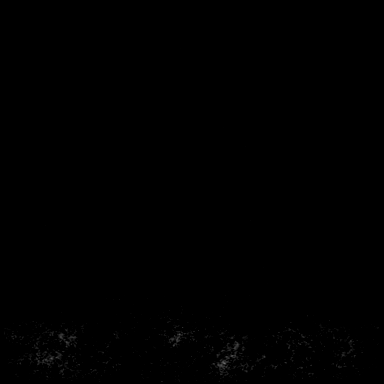
[im 36/144]
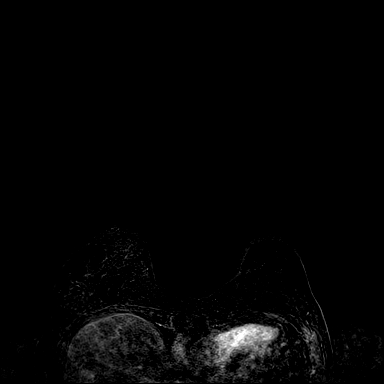
[im 72/144]
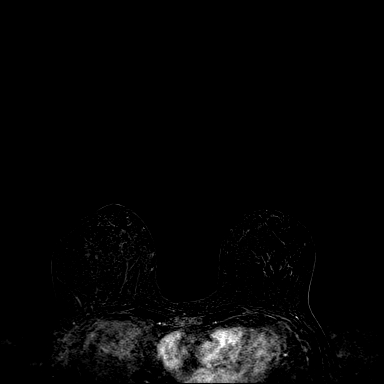
[im 108/144]
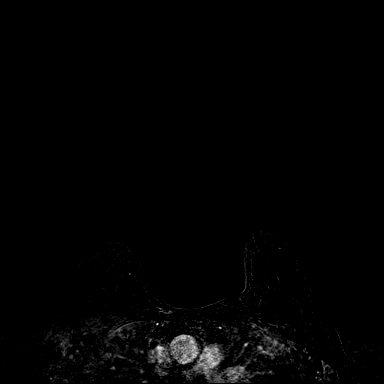
[im 144/144]
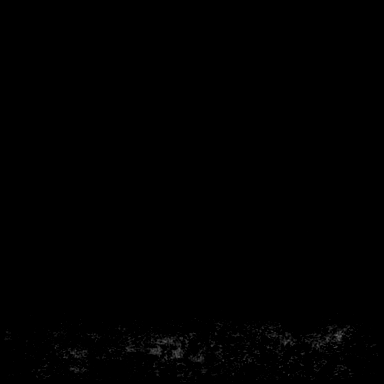

[Series 7: fl3d post immediate · axial · 172.8mm · 0.94mm/px · 1 of 1 slices shown (3 of 3)]
[im 1/1]
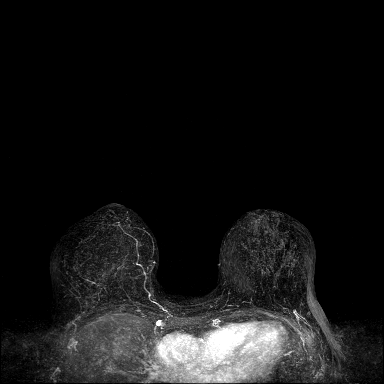

[Series 8: fl3d post 3min · axial · 1.2mm · 0.94mm/px · z∈[-100,+71]mm · 6 of 144 slices shown]
[im 1/144]
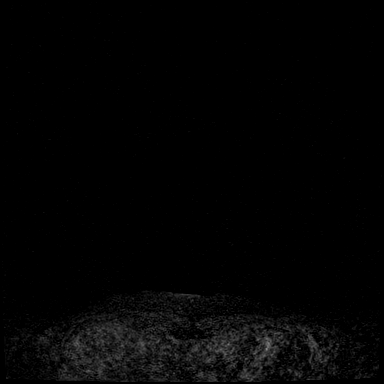
[im 29/144]
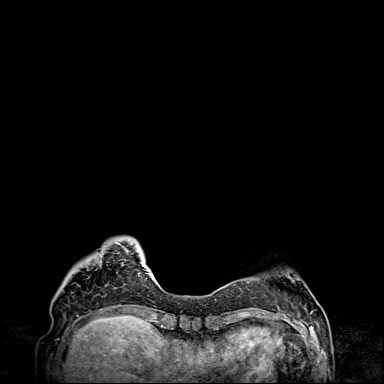
[im 58/144]
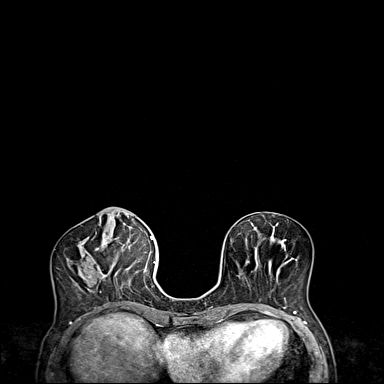
[im 86/144]
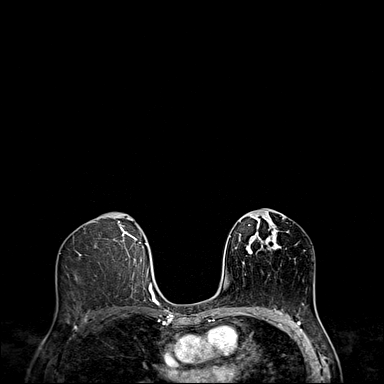
[im 115/144]
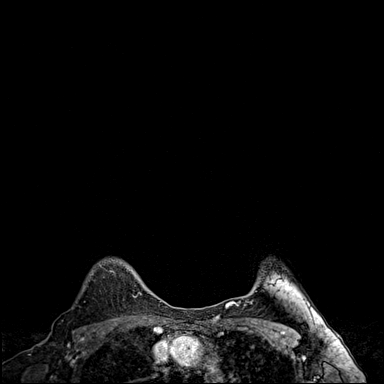
[im 144/144]
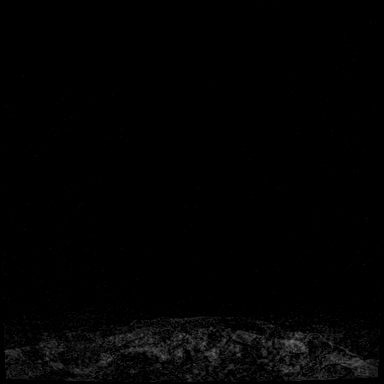

[Series 9: fl3d post 3min_sub · axial · 1.2mm · 0.94mm/px · z∈[-100,+36]mm · 5 of 144 slices shown]
[im 1/144]
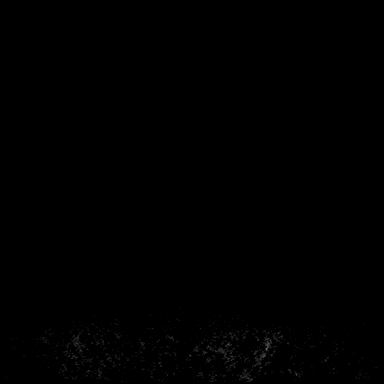
[im 29/144]
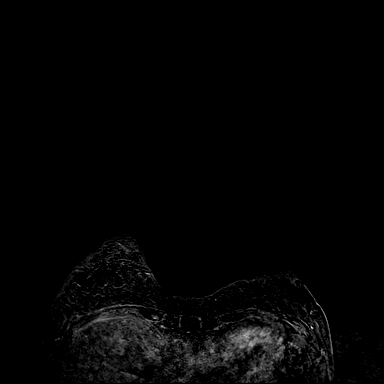
[im 58/144]
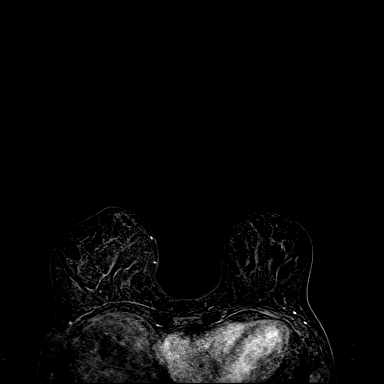
[im 86/144]
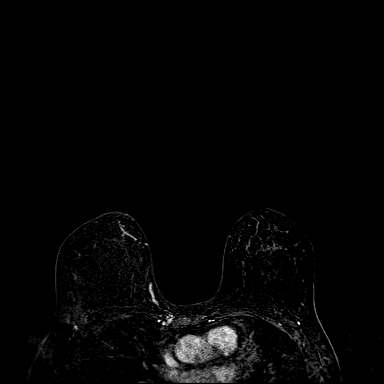
[im 115/144]
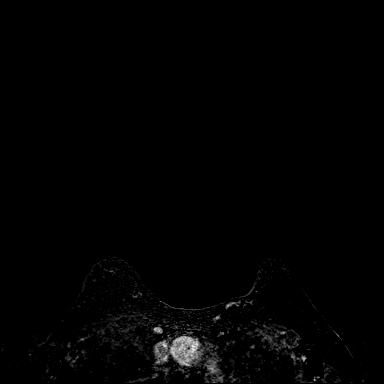

[33 of 48 positions shown; findings below may reference images not displayed]

Three-dimensional MR images were rendered by post-processing of the
original MR data on an independent workstation. The
three-dimensional MR images were interpreted, and findings are
reported in the following complete MRI report for this study. Three
dimensional images were evaluated at the independent interpreting
workstation using the DynaCAD thin client.
FINDINGS: Breast composition: b. Scattered fibroglandular tissue.

Background parenchymal enhancement: Minimal.

Right breast: Surgical changes in the inferior right breast status
post lumpectomy are stable compared to prior. No mass or abnormal
enhancement.

Left breast: No mass or abnormal enhancement.

Lymph nodes: No abnormal appearing lymph nodes.

Ancillary findings:  None.
IMPRESSION: Stable right breast lumpectomy site. No mammographic evidence of
malignancy in the bilateral breasts.

RECOMMENDATION:
1.  Routine annual mammography is due in Friday December, 2021.

2.  Annual high risk screening MRI.

BI-RADS CATEGORY  2: Benign.

## 2023-06-15 ENCOUNTER — Encounter: Payer: Self-pay | Admitting: Hematology and Oncology

## 2023-06-28 DIAGNOSIS — Z85828 Personal history of other malignant neoplasm of skin: Secondary | ICD-10-CM | POA: Diagnosis not present

## 2023-06-28 DIAGNOSIS — C4401 Basal cell carcinoma of skin of lip: Secondary | ICD-10-CM | POA: Diagnosis not present

## 2023-07-19 ENCOUNTER — Other Ambulatory Visit: Payer: Medicare Other

## 2023-07-19 ENCOUNTER — Ambulatory Visit (INDEPENDENT_AMBULATORY_CARE_PROVIDER_SITE_OTHER): Payer: Medicare Other | Admitting: Family Medicine

## 2023-07-19 ENCOUNTER — Encounter: Payer: Self-pay | Admitting: Family Medicine

## 2023-07-19 VITALS — BP 104/72 | HR 67 | Temp 98.3°F | Resp 18 | Ht 58.5 in | Wt 116.2 lb

## 2023-07-19 DIAGNOSIS — R1011 Right upper quadrant pain: Secondary | ICD-10-CM | POA: Diagnosis not present

## 2023-07-19 DIAGNOSIS — R053 Chronic cough: Secondary | ICD-10-CM | POA: Diagnosis not present

## 2023-07-19 DIAGNOSIS — R5383 Other fatigue: Secondary | ICD-10-CM | POA: Diagnosis not present

## 2023-07-19 DIAGNOSIS — E559 Vitamin D deficiency, unspecified: Secondary | ICD-10-CM | POA: Diagnosis not present

## 2023-07-19 NOTE — Progress Notes (Signed)
Established Patient Office Visit  Subjective   Patient ID: Kimberly Miles, female    DOB: December 30, 1955  Age: 68 y.o. MRN: [id number]  Chief Complaint  Patient presents with   Abdominal Pain    X2 weeks, upper right flank     HPI Discussed the use of AI scribe software for clinical note transcription with the patient, who gave verbal consent to proceed.  History of Present Illness   The patient, with a history of migraines and gallbladder issues, presented with a persistent cough and fatigue following an illness that started in early December. The illness began with a migraine and vomiting, followed by chills, fever, generalized weakness, and the development of a cough. The patient reported a gradual return to normal activities over two weeks, but the cough persisted. The cough is described as 'barky' and is associated with significant phlegm production, which is difficult to expectorate. The patient also reported a sensation of weakness in the upper chest when fatigued, particularly after standing and working all day.  The patient also reported recurrent episodes of abdominal pain, which she has experienced since her late twenties. The pain is described as sore, tight, and spasmodic, radiating to the back. These episodes have been occurring more frequently and lasting longer than usual. The patient noted that consumption of certain fatty foods, such as baby back ribs, often triggers these episodes.  The patient also reported postprandial abdominal cramping and a history of esophageal peristalsis issues, which sometimes cause a sensation of food 'sitting' in the throat. The patient also mentioned a tingling sensation in the back and neck, which has been evaluated by a neurologist and a spine specialist, but no definitive cause has been identified.  The patient has been trying to manage her symptoms with over-the-counter medications and lifestyle modifications, but the persistent cough and  fatigue have raised concerns. The patient's spouse has been urging her to seek medical attention for the cough.      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, estrogen receptor positive (HCC) 12/24/2018   Hypothyroid 11/11/2018   Memory change 11/13/2016   Common migraine with intractable migraine 04/06/2016   H/O partial thyroidectomy 01/14/2014   Family history of premature CAD 09/01/2013   History of migraine headaches 02/21/2013   Hyperlipidemia 02/21/2013   Thyroid function study abnormality 02/21/2013   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   IBS (irritable bowel syndrome)    Migraine    MTHFR gene mutation    Personal history of radiation therapy    PMB (postmenopausal bleeding)    PONV  (postoperative nausea and vomiting)    Post-surgical hypothyroidism    Past Surgical History:  Procedure Laterality Date   APPENDECTOMY  as child   BREAST BIOPSY Left 1979   benign   BREAST BIOPSY Left 06/21/2020   BREAST EXCISIONAL BIOPSY Left 1979   benign   BREAST LUMPECTOMY Right 01/2019   BREAST LUMPECTOMY WITH RADIOACTIVE SEED AND SENTINEL LYMPH NODE BIOPSY Right 02/04/2019   Procedure: RIGHT BREAST LUMPECTOMY WITH RADIOACTIVE SEED AND RIGHT AXILLARY DEEP SENTINEL LYMPH NODE BIOPSY WITH BLUE DYE INJECTION;  Surgeon: Claud Kelp, MD;  Location: [facility];  Service: General;  Laterality: Right;   BREAST REDUCTION SURGERY Bilateral 02/11/2019   Procedure: RIGHT BREAST ONCOPLASTIC RECONSTRUCTION, LEFT BREAST REDUCTION;  Surgeon: Glenna Fellows, MD;  Location: [facility];  Service: Plastics;  Laterality: Bilateral;   CARDIOVASCULAR STRESS TEST  09/16/2013   normal nuclear study w/ no ischemia/  normal LV function and wall motion , ef 84%   CESAREAN SECTION  1977 and 1978   Bilateral Tubal Ligation w/ last c/s   COLPOSCOPY  05/2009   CIN 1   HYSTEROSCOPY WITH D & C N/A 12/23/2015   Procedure: DILATATION AND CURETTAGE /HYSTEROSCOPY ;  Surgeon: Jerene Bears, MD;  Location: [facility];  Service: Gynecology;  Laterality: N/A;   NEGATIVE SLEEP STUDY  2014  per pt   OOPHORECTOMY Right 1985   ruptured cyst    REDUCTION MAMMAPLASTY Bilateral 01/2019   THYROID LOBECTOMY Right 03/08/2011   TRANSTHORACIC ECHOCARDIOGRAM  05/09/2011   ef 64%/  mild MR and TR   TUBAL LIGATION     Social History   Tobacco Use   Smoking status: Never   Smokeless tobacco: Never  Vaping Use   Vaping status: Never Used  Substance Use Topics   Alcohol use: Not Currently    Alcohol/week: 0.0 standard drinks of alcohol   Drug use: No   Social History   Socioeconomic History   Marital status: Married    Spouse name: Not on file   Number of children: 2    Years of education: 12   Highest education level: Not on file  Occupational History   Occupation: Retired  Tobacco Use   Smoking status: Never   Smokeless tobacco: Never  Vaping Use   Vaping status: Never Used  Substance and Sexual Activity   Alcohol use: Not Currently    Alcohol/week: 0.0 standard drinks of alcohol   Drug use: No   Sexual activity: Yes    Partners: Male    Birth control/protection: Post-menopausal    Comment: BTL done w/ last C/S  Other Topics Concern   Not on file  Social History Narrative   Lives at home w/ her husband   Right-handed   Caffeine: 1/4 cup per day   Social Drivers of Health   Financial Resource Strain: Patient Declined (07/18/2023)   Overall Financial Resource Strain (CARDIA)    Difficulty of Paying Living Expenses: Patient declined  Food Insecurity: Patient Declined (07/18/2023)   Hunger Vital Sign    Worried  About Running Out of Food in the Last Year: Patient declined    Ran Out of Food in the Last Year: Patient declined  Transportation Needs: Patient Declined (07/18/2023)   PRAPARE - Administrator, Civil Service (Medical): Patient declined    Lack of Transportation (Non-Medical): Patient declined  Physical Activity: Insufficiently Active (07/18/2023)   Exercise Vital Sign    Days of Exercise per Week: 1 day    Minutes of Exercise per Session: 20 min  Stress: No Stress Concern Present (07/18/2023)   [employer] of Occupational Health - Occupational Stress Questionnaire    Feeling of Stress : Not at all  Social Connections: Unknown (07/18/2023)   Social Connection and Isolation Panel [NHANES]    Frequency of Communication with Friends and Family: Patient declined    Frequency of Social Gatherings with Friends and Family: Patient declined    Attends Religious Services: Patient declined    Database administrator or Organizations: Patient declined    Attends Banker Meetings: Not on file    Marital Status:  Married  Intimate Partner Violence: Unknown (09/28/2021)   Received from [employer], [facility]   HITS    Physically Hurt: Not on file    Insult or Talk Down To: Not on file    Threaten Physical Harm: Not on file    Scream or Curse: Not on file   Family Status  Relation Name Status   MGM Fleet Contras Deceased at age 48   MGF Forrest Deceased at age 74   PGF Hiram Deceased   PGM Venita Sheffield Deceased   Mother Marvia Pickles Alive   Father Adela Lank Alive   Brother Casimiro Needle Alive   Mat Aunt Patsy Deceased   Pat Aunt x2 Deceased   Pat Mena Goes Deceased   Pat Uncle Paul Alive  No partnership data on file   Family History  Problem Relation Age of Onset   Hypertension Maternal Grandmother    Uterine cancer Maternal Grandmother 35   Vascular Disease Maternal Grandmother    Lung cancer Maternal Grandmother 35   Stroke Maternal Grandfather    Prostate cancer Paternal Grandfather 45   Migraines Paternal Grandmother    Lung cancer Mother 46   Hypertension Mother 28   Hyperlipidemia Mother 81   Ehlers-Danlos syndrome Mother    Kidney disease Mother    Fibromyalgia Mother    Other Father        bypass surgery times 2   Migraines Father    Osteoporosis Father    Prostate cancer Father    Kidney disease Maternal Aunt    Allergies  Allergen Reactions   Codeine Nausea Only   Demerol [Meperidine] Nausea And Vomiting   Erythromycin Other (See Comments)    Stomach upset   Sulfa Antibiotics Other (See Comments)    unknown reaction   Ampicillin Rash    She reports she also "saw stars"   Chlorhexidine Rash      ROS    Objective:     BP 104/72 (BP Location: Left Arm, Patient Position: Sitting, Cuff Size: Normal)   Pulse 67   Temp 98.3 F (36.8 C) (Oral)   Resp 18   Ht 4' 10.5" (1.486 m)   Wt 116 lb 3.2 oz (52.7 kg)   LMP 08/25/2010   SpO2 100%   BMI 23.87 kg/m  BP Readings from Last 3 Encounters:  07/19/23 104/72  03/06/23 119/66  02/20/23 132/72   Wt Readings from Last 3  Encounters:  07/19/23 116 lb 3.2 oz (52.7 kg)  03/06/23 112 lb 9.6 oz (51.1 kg)  02/20/23 113 lb 12.8 oz (51.6 kg)   SpO2 Readings from Last 3 Encounters:  07/19/23 100%  03/06/23 100%  01/25/23 98%      Physical Exam   No results found for any visits on 07/19/23.  Last CBC Lab Results  Component Value Date   WBC 7.6 09/08/2022   HGB 14.2 09/08/2022   HCT 41.3 09/08/2022   MCV 93.0 09/08/2022   MCH 32.0 09/08/2022   RDW 11.8 09/08/2022   PLT 266 09/08/2022   Last metabolic panel Lab Results  Component Value Date   GLUCOSE 85 09/08/2022   NA 141 09/08/2022   K 4.4 09/08/2022   CL 105 09/08/2022   CO2 25 09/08/2022   BUN 11 09/08/2022   CREATININE 1.19 (H) 09/08/2022   GFRNONAA >60 02/24/2022   CALCIUM 9.0 09/08/2022   PROT 6.6 09/08/2022   ALBUMIN 4.5 02/24/2022   LABGLOB 1.7 10/03/2019   AGRATIO 2.8 (H) 10/03/2019   BILITOT 0.2 09/08/2022   ALKPHOS 109 02/24/2022   AST 22 09/08/2022   ALT 28 09/08/2022   ANIONGAP 4 (L) 02/24/2022   Last lipids Lab Results  Component Value Date   CHOL 239 (H) 04/26/2021   HDL 77.00 04/26/2021   LDLCALC 148 (H) 04/26/2021   TRIG 67.0 04/26/2021   CHOLHDL 3 04/26/2021   Last hemoglobin A1c No results found for: "HGBA1C" Last thyroid functions Lab Results  Component Value Date   TSH 3.36 09/08/2022   T4TOTAL 8.0 02/24/2022   Last vitamin D Lab Results  Component Value Date   VD25OH 36 09/08/2022   Last vitamin B12 and Folate Lab Results  Component Value Date   VITAMINB12 342 09/29/2021   FOLATE >20.0 11/24/2021      The ASCVD Risk score (Arnett DK, et al., 2019) failed to calculate for the following reasons:   Unable to determine if patient is Non-Hispanic African American    Assessment & Plan:   Problem List Items Addressed This Visit   None Visit Diagnoses       RUQ pain    -  Primary   Relevant Orders   US Abdomen Limited RUQ (LIVER/GB)   CBC with Differential/Platelet   Comprehensive  metabolic panel     Chronic cough       Relevant Orders   DG Chest 2 View   CBC with Differential/Platelet   Comprehensive metabolic panel     Other fatigue       Relevant Orders   TSH   Vitamin B12   VITAMIN D 25 Hydroxy (Vit-D Deficiency, Fractures)   Epstein-Barr virus VCA antibody panel     Vitamin D deficiency, unspecified       Relevant Orders   VITAMIN D 25 Hydroxy (Vit-D Deficiency, Fractures)     Assessment and Plan    Persistent Cough   The persistent cough has lasted over a month following an illness that began on December 4th, characterized by a barky cough, phlegm production, and fatigue. It worsens with activities such as standing, working, and lying down. The differential diagnosis includes post-viral cough, bronchitis, and possible pneumonia. Some improvement is noted, but significant symptoms persist. A chest x-ray is ordered to rule out pneumonia or other lung pathology. Mucinex is recommended to thin mucus and facilitate expectoration, along with steam or heat to loosen phlegm. Consider delaying the chest x-ray if symptoms improve.  Fatigue  Persistent fatigue, especially post-exertion, is accompanied by chest weakness. The differential diagnosis includes post-viral syndrome, vitamin deficiencies, and possible Epstein-Barr virus (EBV) infection. Potential vitamin deficiencies and EBV are discussed as contributing factors. Lab tests are ordered to identify deficiencies or past EBV infection.  Gallbladder Disease   Intermittent abdominal and back pain suggest gallbladder pathology, possibly related to gallbladder sludge. Symptoms are exacerbated by fatty foods. A previous CT scan in March showed no significant findings, but an ultrasound is more sensitive for detecting gallbladder issues such as sludge or stones. An ultrasound is ordered to evaluate the gallbladder.  General Health Maintenance   Vitamin D levels, previously low normal, are discussed. Efforts to improve  vitamin D intake are ongoing. Rechecking levels is important to ensure they have not decreased further.  Follow-up   Schedule an ultrasound for gallbladder evaluation. Perform a chest x-ray if symptoms persist or worsen. Complete lab tests for vitamin levels and EBV antibodies. Follow up with results of imaging and lab tests.        Return if symptoms worsen or fail to improve.    Donato Schultz, DO

## 2023-07-19 NOTE — Patient Instructions (Signed)
Abdominal Pain, Adult  Pain in the abdomen (abdominal pain) can be caused by many things. In most cases, it gets better with no treatment or by being treated at home. But in some cases, it can be serious. Your health care provider will ask questions about your medical history and do a physical exam to try to figure out what is causing your pain. Follow these instructions at home: Medicines Take over-the-counter and prescription medicines only as told by your provider. Do not take medicines that help you poop (laxatives) unless told by your provider. General instructions Watch your condition for any changes. Drink enough fluid to keep your pee (urine) pale yellow. Contact a health care provider if: Your pain changes, gets worse, or lasts longer than expected. You have severe cramping or bloating in your abdomen, or you vomit. Your pain gets worse with meals, after eating, or with certain foods. You are constipated or have diarrhea for more than 2-3 days. You are not hungry, or you lose weight without trying. You have signs of dehydration. These may include: Dark pee, very little pee, or no pee. Cracked lips or dry mouth. Sleepiness or weakness. You have pain when you pee (urinate) or poop. Your abdominal pain wakes you up at night. You have blood in your pee. You have a fever. Get help right away if: You cannot stop vomiting. Your pain is only in one part of the abdomen. Pain on the right side could be caused by appendicitis. You have bloody or black poop (stool), or poop that looks like tar. You have trouble breathing. You have chest pain. These symptoms may be an emergency. Get help right away. Call 911. Do not wait to see if the symptoms will go away. Do not drive yourself to the hospital. This information is not intended to replace advice given to you by your health care provider. Make sure you discuss any questions you have with your health care provider. Document Revised:  03/29/2022 Document Reviewed: 03/29/2022 Elsevier Patient Education  2024 Elsevier Inc.  

## 2023-07-20 ENCOUNTER — Ambulatory Visit (HOSPITAL_BASED_OUTPATIENT_CLINIC_OR_DEPARTMENT_OTHER)
Admission: RE | Admit: 2023-07-20 | Discharge: 2023-07-20 | Disposition: A | Discharge status: Home / Self Care | Payer: Medicare Other | Source: Ambulatory Visit | Attending: Family Medicine | Admitting: Family Medicine

## 2023-07-20 DIAGNOSIS — K7689 Other specified diseases of liver: Secondary | ICD-10-CM | POA: Diagnosis not present

## 2023-07-20 DIAGNOSIS — R1011 Right upper quadrant pain: Secondary | ICD-10-CM | POA: Insufficient documentation

## 2023-07-20 DIAGNOSIS — B309 Viral conjunctivitis, unspecified: Secondary | ICD-10-CM | POA: Diagnosis not present

## 2023-07-20 DIAGNOSIS — R160 Hepatomegaly, not elsewhere classified: Secondary | ICD-10-CM | POA: Diagnosis not present

## 2023-07-20 DIAGNOSIS — K838 Other specified diseases of biliary tract: Secondary | ICD-10-CM | POA: Diagnosis not present

## 2023-07-20 LAB — COMPREHENSIVE METABOLIC PANEL
ALT: 43 U/L — ABNORMAL HIGH (ref 0–35)
AST: 32 U/L (ref 0–37)
Albumin: 4.5 g/dL (ref 3.5–5.2)
Alkaline Phosphatase: 86 U/L (ref 39–117)
BUN: 17 mg/dL (ref 6–23)
CO2: 26 meq/L (ref 19–32)
Calcium: 9 mg/dL (ref 8.4–10.5)
Chloride: 104 meq/L (ref 96–112)
Creatinine, Ser: 0.73 mg/dL (ref 0.40–1.20)
GFR: 83.62 mL/min (ref >60.00)
Glucose, Bld: 85 mg/dL (ref 70–99)
Potassium: 3.7 meq/L (ref 3.5–5.1)
Sodium: 139 meq/L (ref 135–145)
Total Bilirubin: 0.3 mg/dL (ref 0.2–1.2)
Total Protein: 6.3 g/dL (ref 6.0–8.3)

## 2023-07-20 LAB — CBC WITH DIFFERENTIAL/PLATELET
Basophils Absolute: 0 10*3/uL (ref 0.0–0.1)
Basophils Relative: 0.4 % (ref 0.0–3.0)
Eosinophils Absolute: 0.2 10*3/uL (ref 0.0–0.7)
Eosinophils Relative: 2.1 % (ref 0.0–5.0)
HCT: 42.4 % (ref 36.0–46.0)
Hemoglobin: 13.9 g/dL (ref 12.0–15.0)
Lymphocytes Relative: 25 % (ref 12.0–46.0)
Lymphs Abs: 2 10*3/uL (ref 0.7–4.0)
MCHC: 32.8 g/dL (ref 30.0–36.0)
MCV: 96.4 fL (ref 78.0–100.0)
Monocytes Absolute: 0.5 10*3/uL (ref 0.1–1.0)
Monocytes Relative: 6.4 % (ref 3.0–12.0)
Neutro Abs: 5.2 10*3/uL (ref 1.4–7.7)
Neutrophils Relative %: 66.1 % (ref 43.0–77.0)
Platelets: 245 10*3/uL (ref 150.0–400.0)
RBC: 4.4 Mil/uL (ref 3.87–5.11)
RDW: 13.1 % (ref 11.5–15.5)
WBC: 7.9 10*3/uL (ref 4.0–10.5)

## 2023-07-20 LAB — TSH: TSH: 4.14 u[IU]/mL (ref 0.35–5.50)

## 2023-07-20 LAB — VITAMIN B12: Vitamin B-12: >1537 pg/mL — ABNORMAL HIGH (ref 211–911)

## 2023-07-20 LAB — VITAMIN D 25 HYDROXY (VIT D DEFICIENCY, FRACTURES): VITD: 42.41 ng/mL (ref 30.00–100.00)

## 2023-07-21 ENCOUNTER — Encounter: Payer: Self-pay | Admitting: Family Medicine

## 2023-07-21 ENCOUNTER — Other Ambulatory Visit: Payer: Self-pay | Admitting: Family Medicine

## 2023-07-21 DIAGNOSIS — R1011 Right upper quadrant pain: Secondary | ICD-10-CM

## 2023-07-21 LAB — EPSTEIN-BARR VIRUS VCA ANTIBODY PANEL
EBV NA IgG: 43 U/mL — ABNORMAL HIGH
EBV VCA IgG: 167 U/mL — ABNORMAL HIGH
EBV VCA IgM: <36 U/mL

## 2023-07-26 ENCOUNTER — Encounter: Payer: Self-pay | Admitting: Family Medicine

## 2023-08-02 ENCOUNTER — Ambulatory Visit (HOSPITAL_BASED_OUTPATIENT_CLINIC_OR_DEPARTMENT_OTHER)
Admission: RE | Admit: 2023-08-02 | Discharge: 2023-08-02 | Disposition: A | Discharge status: Home / Self Care | Payer: Medicare Other | Source: Ambulatory Visit | Attending: Family Medicine | Admitting: Family Medicine

## 2023-08-02 DIAGNOSIS — R053 Chronic cough: Secondary | ICD-10-CM | POA: Insufficient documentation

## 2023-08-02 DIAGNOSIS — R059 Cough, unspecified: Secondary | ICD-10-CM | POA: Diagnosis not present

## 2023-08-08 ENCOUNTER — Encounter: Payer: Self-pay | Admitting: Family Medicine

## 2023-08-08 DIAGNOSIS — C4491 Basal cell carcinoma of skin, unspecified: Secondary | ICD-10-CM | POA: Insufficient documentation

## 2023-08-13 ENCOUNTER — Encounter: Payer: Self-pay | Admitting: Family Medicine

## 2023-08-14 ENCOUNTER — Encounter: Payer: Self-pay | Admitting: Family Medicine

## 2023-08-30 DIAGNOSIS — C4401 Basal cell carcinoma of skin of lip: Secondary | ICD-10-CM | POA: Diagnosis not present

## 2023-08-30 DIAGNOSIS — Z85828 Personal history of other malignant neoplasm of skin: Secondary | ICD-10-CM | POA: Diagnosis not present

## 2023-09-03 ENCOUNTER — Ambulatory Visit
Admission: RE | Admit: 2023-09-03 | Discharge: 2023-09-03 | Disposition: A | Discharge status: Home / Self Care | Payer: Medicare Other | Source: Ambulatory Visit | Attending: Hematology and Oncology | Admitting: Hematology and Oncology

## 2023-09-03 DIAGNOSIS — Z853 Personal history of malignant neoplasm of breast: Secondary | ICD-10-CM | POA: Diagnosis not present

## 2023-09-03 DIAGNOSIS — Z170 Estrogen receptor positive status [ER+]: Secondary | ICD-10-CM

## 2023-09-03 DIAGNOSIS — Z1239 Encounter for other screening for malignant neoplasm of breast: Secondary | ICD-10-CM | POA: Diagnosis not present

## 2023-09-03 MED ORDER — GADOPICLENOL 0.5 MMOL/ML IV SOLN
5.0000 mL | Freq: Once | INTRAVENOUS | Status: AC | PRN
  Administered 2023-09-03: 5 mL via INTRAVENOUS

## 2023-09-04 ENCOUNTER — Telehealth: Payer: Self-pay | Admitting: *Deleted

## 2023-09-04 NOTE — Telephone Encounter (Signed)
Per MD request, RN placed call to pt regarding negative (Not Detected) recent [employer] testing.  Pt appreciative of call and verbalized understanding.  

## 2023-09-05 ENCOUNTER — Encounter: Payer: Self-pay | Admitting: Hematology and Oncology

## 2023-09-10 ENCOUNTER — Telehealth: Payer: Self-pay

## 2023-09-10 NOTE — Telephone Encounter (Signed)
-----   Message from Tamsen Meek sent at 09/03/2023  4:26 PM EDT ----- Regarding: Inform patient Vernona Rieger Please let her know that her breast MRI is normal. Thank you Vinay ----- Message ----- From: Interface, Rad Results In Sent: 09/03/2023  11:46 AM EDT To: Serena Croissant, MD

## 2023-09-10 NOTE — Telephone Encounter (Signed)
 Pt aware of results and verbalized thanks.

## 2023-09-12 ENCOUNTER — Telehealth: Payer: Self-pay

## 2023-09-12 NOTE — Telephone Encounter (Signed)
-----   Message from Tamsen Meek sent at 09/03/2023  4:26 PM EDT ----- Regarding: Inform patient Kimberly Miles Please let her know that her breast MRI is normal. Thank you Vinay ----- Message ----- From: Interface, Rad Results In Sent: 09/03/2023  11:46 AM EDT To: Serena Croissant, MD

## 2023-09-12 NOTE — Telephone Encounter (Signed)
 Pt aware and verbalized thanks.

## 2023-09-24 DIAGNOSIS — H04123 Dry eye syndrome of bilateral lacrimal glands: Secondary | ICD-10-CM | POA: Diagnosis not present

## 2023-09-24 DIAGNOSIS — H5711 Ocular pain, right eye: Secondary | ICD-10-CM | POA: Diagnosis not present

## 2023-09-24 DIAGNOSIS — H524 Presbyopia: Secondary | ICD-10-CM | POA: Diagnosis not present

## 2023-09-24 DIAGNOSIS — H25813 Combined forms of age-related cataract, bilateral: Secondary | ICD-10-CM | POA: Diagnosis not present

## 2023-09-24 DIAGNOSIS — D3132 Benign neoplasm of left choroid: Secondary | ICD-10-CM | POA: Diagnosis not present

## 2024-01-09 ENCOUNTER — Encounter: Payer: Self-pay | Admitting: Family Medicine

## 2024-01-09 DIAGNOSIS — Z1211 Encounter for screening for malignant neoplasm of colon: Secondary | ICD-10-CM

## 2024-01-22 DIAGNOSIS — Z1211 Encounter for screening for malignant neoplasm of colon: Secondary | ICD-10-CM | POA: Diagnosis not present

## 2024-01-30 ENCOUNTER — Ambulatory Visit: Payer: Self-pay | Admitting: Family Medicine

## 2024-01-30 LAB — COLOGUARD: COLOGUARD: NEGATIVE

## 2024-02-26 ENCOUNTER — Other Ambulatory Visit: Payer: Self-pay | Admitting: *Deleted

## 2024-02-26 DIAGNOSIS — C50311 Malignant neoplasm of lower-inner quadrant of right female breast: Secondary | ICD-10-CM

## 2024-02-26 NOTE — Progress Notes (Signed)
 [employer] renewal orders placed.

## 2024-03-03 ENCOUNTER — Ambulatory Visit
Admission: RE | Admit: 2024-03-03 | Discharge: 2024-03-03 | Disposition: A | Discharge status: Home / Self Care | Source: Ambulatory Visit | Attending: Hematology and Oncology | Admitting: Hematology and Oncology

## 2024-03-03 ENCOUNTER — Ambulatory Visit (INDEPENDENT_AMBULATORY_CARE_PROVIDER_SITE_OTHER): Admitting: Internal Medicine

## 2024-03-03 ENCOUNTER — Other Ambulatory Visit: Payer: Self-pay

## 2024-03-03 ENCOUNTER — Other Ambulatory Visit: Payer: Self-pay | Admitting: Hematology and Oncology

## 2024-03-03 VITALS — BP 118/68 | HR 63 | Temp 98.6°F | Resp 20 | Ht 58.5 in | Wt 116.4 lb

## 2024-03-03 DIAGNOSIS — G43809 Other migraine, not intractable, without status migrainosus: Secondary | ICD-10-CM

## 2024-03-03 DIAGNOSIS — Z170 Estrogen receptor positive status [ER+]: Secondary | ICD-10-CM

## 2024-03-03 DIAGNOSIS — H6993 Unspecified Eustachian tube disorder, bilateral: Secondary | ICD-10-CM | POA: Diagnosis not present

## 2024-03-03 DIAGNOSIS — R928 Other abnormal and inconclusive findings on diagnostic imaging of breast: Secondary | ICD-10-CM | POA: Diagnosis not present

## 2024-03-03 DIAGNOSIS — J3089 Other allergic rhinitis: Secondary | ICD-10-CM | POA: Diagnosis not present

## 2024-03-03 DIAGNOSIS — R140 Abdominal distension (gaseous): Secondary | ICD-10-CM

## 2024-03-03 DIAGNOSIS — R92323 Mammographic fibroglandular density, bilateral breasts: Secondary | ICD-10-CM | POA: Diagnosis not present

## 2024-03-03 DIAGNOSIS — K9049 Malabsorption due to intolerance, not elsewhere classified: Secondary | ICD-10-CM | POA: Diagnosis not present

## 2024-03-03 MED ORDER — IPRATROPIUM BROMIDE 0.06 % NA SOLN: 2.0000 | Freq: Four times a day (QID) | NASAL | 12 refills | Status: AC

## 2024-03-03 MED ORDER — IOPAMIDOL (ISOVUE-370) INJECTION 76%
100.0000 mL | Freq: Once | INTRAVENOUS | Status: AC | PRN
  Administered 2024-03-03: 100 mL via INTRAVENOUS

## 2024-03-03 NOTE — Progress Notes (Signed)
 NEW PATIENT Date of Service/Encounter:  03/03/24 Referring provider: Antonio Meth, Jamee SAUNDERS, * Primary care provider: Antonio Meth, Jamee SAUNDERS, DO  Subjective:  Kimberly Miles is a 68 y.o. female  presenting today for evaluation of environmental allergies  History obtained from: chart review and patient.   Discussed the use of AI scribe software for clinical note transcription with the patient, who gave verbal consent to proceed.  History of Present Illness Kimberly Miles is a 69 year old who presents with chronic migraines, nasal congestion, and ear fullness.  Migraine cephalalgia - Migraines present for over thirty years - Suspects sodium benzoate (in foods such as [place] food and salad dressings) as a trigger - Avoids sodium benzoate-containing foods - Takes Benadryl before bed, believing it may help with migraines - Elimination diets attempted in the past without identifying triggers except for sodium benzoate - Elimination of dairy and wheat did not significantly improve migraines  Nasal congestion and rhinorrhea - Constant nasal stuffiness since the beginning of the year - No periods of clear nasal passages - Complete nasal obstruction at night when sleeping on one side, causing nocturnal awakenings - Rhinorrhea triggered by eating, possibly related to dairy or wheat - No recent allergy testing; allergy testing in the 1980s with unclear results  Aural fullness and tinnitus - Ear fullness and ringing present for a long duration - Occasional ear aching and sensation of movement inside the ears - Unable to recall onset of tinnitus  Gastrointestinal symptoms associated with food intake - Bloating and fatigue after eating, particularly with dairy and possibly wheat - No recent food journal - Elimination of dairy and wheat in the past did not significantly improve migraines  Ocular dryness and pruritus - Dry eyes with occasional pruritus in the  corners - No specific environmental triggers identified  Respiratory symptoms - No lower respiratory symptoms such as asthma, shortness of breath, or wheezing - Occasionally unable to take a full, satisfying breath  Drug and alcohol sensitivities - Drug allergies to sulfa antibiotics, ampicillin, and erythromycin with reactions including rash, nausea, and stomach upset - Sensitivity to alcohol, with severe illness after consuming more than two drinks - Possible hereditary alcohol sensitivity, as her son experiences similar symptoms      Other allergy screening: Asthma: no Rhino conjunctivitis: yes Food allergy: no Medication allergy: no Hymenoptera allergy: no Urticaria: no Eczema:no History of recurrent infections suggestive of immunodeficency: no Vaccinations are up to date.   Past Medical History: Past Medical History:  Diagnosis Date   Anxiety    Arthritis    Cancer (HCC) 11/2018   right breast cancer   Common migraine with intractable migraine 04/06/2016   Complication of anesthesia    Diverticulosis    Elevated LFTs    monitored by pcp per pt --  unknown etiology   Family history of premature CAD 09/01/2013   Family history of prostate cancer    Family history of uterine cancer    History of basal cell carcinoma excision    2004   &  2014   History of colitis    History of palpitations    History of thyroid  nodule    s/p  right thyroid  lobectomy 03-08-2011  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 2 tablet daily     b complex vitamins capsule Take 1 capsule by mouth daily.      Cholecalciferol (VITAMIN D3) 5000 UNITS CAPS Take 1 capsule by mouth daily.     ipratropium (ATROVENT ) 0.06 % nasal spray Place 2 sprays into both nostrils 4 (four) times daily. 15 mL 12   magnesium oxide (MAG-OX) 400 (240 Mg) MG tablet Take 400 mg by mouth daily.     TURMERIC PO Take by mouth.     Cyanocobalamin  (B12 LIQUID HEALTH BOOSTER PO) Take by mouth.     zinc gluconate 50 MG tablet Take 15 mg by mouth daily.     No current facility-administered medications for this visit.   Known Allergies:  Allergies  Allergen Reactions   Codeine Nausea Only   Demerol [Meperidine] Nausea And Vomiting   Erythromycin Other (See Comments)    Stomach upset   Sulfa Antibiotics Other (See Comments)    unknown reaction   Ampicillin Rash    She reports she also saw stars   Chlorhexidine  Rash   Past Surgical History: Past Surgical History:  Procedure Laterality Date   APPENDECTOMY  as child   BREAST BIOPSY Left 1979   benign   BREAST BIOPSY Left 06/21/2020   BREAST EXCISIONAL BIOPSY Left 1979   benign   BREAST LUMPECTOMY Right 01/2019   BREAST LUMPECTOMY WITH RADIOACTIVE SEED AND SENTINEL LYMPH NODE BIOPSY Right 02/04/2019   Procedure: RIGHT BREAST LUMPECTOMY WITH RADIOACTIVE SEED AND RIGHT AXILLARY DEEP SENTINEL LYMPH NODE BIOPSY WITH BLUE DYE INJECTION;  Surgeon: Gail Favorite, MD;  Location: [facility];  Service: General;  Laterality: Right;   BREAST REDUCTION SURGERY Bilateral 02/11/2019   Procedure: RIGHT BREAST ONCOPLASTIC RECONSTRUCTION, LEFT BREAST REDUCTION;  Surgeon: Arelia Filippo, MD;  Location: [facility];  Service: Plastics;  Laterality: Bilateral;   CARDIOVASCULAR STRESS TEST  09/16/2013   normal nuclear study w/ no ischemia/  normal LV function and wall motion , ef 84%   CESAREAN SECTION  1977 and 1978   Bilateral Tubal Ligation w/ last c/s   COLPOSCOPY  05/2009   CIN 1   HYSTEROSCOPY WITH D & C N/A 12/23/2015   Procedure: DILATATION AND  CURETTAGE /HYSTEROSCOPY ;  Surgeon: Ronal GORMAN Pinal, MD;  Location: [facility];  Service: Gynecology;  Laterality: N/A;   NEGATIVE SLEEP STUDY  2014  per pt   OOPHORECTOMY Right 1985   ruptured cyst    REDUCTION MAMMAPLASTY Bilateral 01/2019   THYROID  LOBECTOMY Right 03/08/2011   TRANSTHORACIC ECHOCARDIOGRAM  05/09/2011   ef 64%/  mild MR and TR   TUBAL LIGATION     Family History: Family History  Problem Relation Age of Onset   Hypertension Maternal Grandmother    Uterine cancer Maternal Grandmother 87   Vascular Disease Maternal Grandmother    Lung cancer Maternal Grandmother 38   Stroke Maternal Grandfather    Prostate cancer Paternal Grandfather 12   Migraines Paternal Grandmother    Lung cancer Mother 82   Hypertension Mother 10   Hyperlipidemia Mother 74   Ehlers-Danlos syndrome Mother    Kidney disease Mother    Fibromyalgia Mother    Other Father        bypass surgery times 2   Migraines  Father    Osteoporosis Father    Prostate cancer Father    Kidney disease Maternal Aunt    Social History: [place]rean lives [facility] 1967.  No water damage in the house.  No carpet.  Gas boiler central cooling.  No pets.  Dust mite precautions on bed but not pillows.  No tobacco exposure.  Is retired.  ROS:  All other systems negative except as noted per HPI.  Objective:  Blood pressure 118/68, pulse 63, temperature 98.6 F (37 C), temperature source Temporal, resp. rate 20, height 4' 10.5 (1.486 m), weight 116 lb 6.4 oz (52.8 kg), last menstrual period 08/25/2010, SpO2 99%. Body mass index is 23.91 kg/m. Physical Exam:  General Appearance:  Alert, cooperative, no distress, appears stated age  Head:  Normocephalic, without obvious abnormality, atraumatic  Eyes:  Conjunctiva clear, EOM's intact  Ears EACs normal bilaterally and normal TMs bilaterally  Nose: Nares normal, Pink mucosa , no visible anterior polyps, and septum midline  Throat: Lips, tongue normal; teeth  and gums normal, normal posterior oropharynx  Neck: Supple, symmetrical  Lungs:   clear to auscultation bilaterally, Respirations unlabored, no coughing  Heart:  regular rate and rhythm and no murmur, Appears well perfused  Extremities: No edema  Skin: Skin color, texture, turgor normal and no rashes or lesions on visualized portions of skin  Neurologic: No gross deficits   Diagnostics: None done    Labs:  Lab Orders  No laboratory test(s) ordered today     Assessment and Plan  Assessment and Plan Assessment & Plan 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 Environmental allergens suspected but not identified. She prefers to avoid medications but is open to treatments that improve quality of life. Allergy immunotherapy discussed. - Atrovent  (ipatopium) nasal spray 1-2 sprays in each nostril up to three times daily AS NEEDED for POST NASAL DRIP/RUNNY NOSE/DRAINAGE.  If you become too dry, use less often. - Schedule allergy testing for environmental allergens on Monday, March 17, 2024, at 3:15 PM. - Instruct to avoid antihistamines for three days before allergy testing.  Chronic eustachian tube dysfunction with ear fullness and tinnitus Chronic eustachian tube dysfunction with symptoms of ear fullness and tinnitus, with occasional aching and sensation of movement in the ears.  Migraine Migraines potentially exacerbated by food additives such as sodium benzoate. Elimination diets attempted without significant success. While migraines are not typically caused by allergies, controlling allergic inflammation may reduce migraine frequency by up to 25%. - Advise continuation of avoidance of known migraine triggers, such as sodium benzoate. - Discuss the potential benefit of controlling allergies to reduce migraine  frequency.  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 Previous elimination diets have not provided significant relief. Food sensitivity testing not recommended due to lack of validity and potential for false positives. - Recommend trial of lactose-free dairy products to assess improvement in bloating. - Discuss the limitations of current food sensitivity testing and the importance of elimination diets for identifying intolerances. - Consider low FODMAP diet   Alcohol intolerance Severe reactions to alcohol, including migraines and gastrointestinal symptoms, suggestive of alcohol intolerance, possibly due to low levels of the enzyme that metabolizes alcohol. Some relief found with NAC supplementation before alcohol consumption. Discussed the hereditary nature of alcohol intolerance  and the lack of a definitive treatment. - Advise avoidance of alcohol consumption to avoid severe reactions.       This note in its entirety was forwarded to the Provider who requested this consultation.  Other:    Thank you for your kind referral. I appreciate the opportunity to take part in Kharlie's care. Please do not hesitate to contact me with questions.  Sincerely,  Thank you so much for letting me partake in your care today.  Don't hesitate to reach out if you have any additional concerns!  Hargis Springer, MD  [facility] [place], [place]

## 2024-03-03 NOTE — Patient Instructions (Addendum)
 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 Environmental allergens suspected but not identified. She prefers to avoid medications but is open to treatments that improve quality of life. Allergy immunotherapy discussed. - Atrovent  (ipatopium) nasal spray 1-2 sprays in each nostril up to three times daily AS NEEDED for POST NASAL DRIP/RUNNY NOSE/DRAINAGE.  If you become too dry, use less often. - Schedule allergy testing for environmental allergens on Monday, March 17, 2024, at 3:15 PM. - Instruct to avoid antihistamines for three days before allergy testing.  Chronic eustachian tube dysfunction with ear fullness and tinnitus Chronic eustachian tube dysfunction with symptoms of ear fullness and tinnitus, with occasional aching and sensation of movement in the ears.  Migraine Migraines potentially exacerbated by food additives such as sodium benzoate. Elimination diets attempted without significant success. While migraines are not typically caused by allergies, controlling allergic inflammation may reduce migraine frequency by up to 25%. - Advise continuation of avoidance of known migraine triggers, such as sodium benzoate. - Discuss the potential benefit of controlling allergies to reduce migraine frequency.  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 Previous elimination diets have not provided significant relief. Food sensitivity testing not recommended due to lack of validity and potential for false positives. - Recommend trial of lactose-free dairy products to assess improvement in bloating. - Discuss the limitations of current food sensitivity testing and the importance  of elimination diets for identifying intolerances. - Consider low FODMAP diet   Alcohol intolerance Severe reactions to alcohol, including migraines and gastrointestinal symptoms, suggestive of alcohol intolerance, possibly due to low levels of the enzyme that metabolizes alcohol. Some relief found with NAC supplementation before alcohol consumption. Discussed the hereditary nature of alcohol intolerance and the lack of a definitive treatment. - Advise avoidance of alcohol consumption to avoid severe reactions.  Follow up; 03/17/24 at 3:15 for allergy testing (1-55)   Thank you so much for letting me partake in your care today.  Don't hesitate to reach out if you have any additional concerns!  Hargis Springer, MD  [facility] [place]

## 2024-03-17 ENCOUNTER — Ambulatory Visit: Admitting: Internal Medicine

## 2024-03-17 ENCOUNTER — Ambulatory Visit (INDEPENDENT_AMBULATORY_CARE_PROVIDER_SITE_OTHER): Admitting: Internal Medicine

## 2024-03-17 DIAGNOSIS — J3089 Other allergic rhinitis: Secondary | ICD-10-CM

## 2024-03-17 LAB — SIGNATERA ONLY (NATERA MANAGED)
SIGNATERA MTM READOUT: 0 MTM/ml
SIGNATERA TEST RESULT: NEGATIVE

## 2024-03-17 NOTE — Progress Notes (Signed)
 Date of Service/Encounter:  03/17/24  Allergy testing appointment   Initial visit on 03/03/24, seen for rhinitis .  Please see that note for additional details.  Today reports for allergy diagnostic testing:    DIAGNOSTICS:  Skin Testing: Environmental allergy panel. Adequate positive and negative controls Results discussed with patient/family.  Airborne Adult Perc - 03/17/24 1503     Time Antigen Placed 0305    Allergen Manufacturer Jestine    Location Back    Number of Test 55    1. Control-Buffer 50% Glycerol Negative    2. Control-Histamine Negative    3. Bahia Negative    4. [place] Negative    5. Johnson Negative    6. [place]  Blue Negative    7. Meadow Fescue Negative    8. Perennial Rye Negative    9. Timothy Negative    10. Ragweed Mix Negative    11. Cocklebur Negative    12. Plantain,  English Negative    13. Baccharis Negative    14. Dog Fennel Negative    15. Russian Thistle Negative    16. Lamb's Quarters Negative    17. Sheep Sorrell Negative    18. Rough Pigweed Negative    19. Marsh Elder, Rough Negative    20. Mugwort, Common Negative    21. Box, Elder Negative    22. Cedar, red Negative    23. Sweet Gum Negative    24. Pecan Pollen Negative    25. Pine Mix Negative    26. Walnut, Black Pollen Negative    27. Red Mulberry Negative    28. Ash Mix Negative    29. Birch Mix Negative    30. Beech American Negative    31. Cottonwood, [place] Negative    32. Hickory, White Negative    33. Maple Mix Negative    34. Oak, [place] Mix Negative    35. Sycamore Eastern Negative    36. Alternaria Alternata Negative    37. Cladosporium Herbarum Negative    38. Aspergillus Mix Negative    39. Penicillium Mix Negative    40. Bipolaris Sorokiniana (Helminthosporium) Negative    41. Drechslera Spicifera (Curvularia) Negative    42. Mucor Plumbeus Negative    43. Fusarium Moniliforme Negative    44. Aureobasidium Pullulans (pullulara) Negative    45.  Rhizopus Oryzae Negative    46. Botrytis Cinera Negative    47. Epicoccum Nigrum Negative    48. Phoma Betae Negative    49. Dust Mite Mix Negative    50. Cat Hair 10,000 BAU/ml Negative    51.  Dog Epithelia Negative    52. Mixed Feathers Negative    53. Horse Epithelia Negative    54. Cockroach, German Negative    55. Tobacco Leaf Negative          Intradermal - 03/17/24 1612     Time Antigen Placed 0400    Location Arm    Number of Test 16    Control 2+    [place] Negative    [place] Negative    Johnson Negative    7 Grass Negative    Ragweed Mix Negative    Weed Mix Negative    Tree Mix Negative    Mold 1 Negative    Mold 2 Negative    Mold 3 Negative    Mold 4 Negative    Mite Mix Negative    Cat Negative    Dog Negative    Cockroach Negative  Other Negative          Allergy testing results were read and interpreted by myself, documented by clinical staff.  Patient provided with copy of allergy testing along with avoidance measures when indicated.   Hargis Springer, MD  [facility] [place] 

## 2024-03-31 ENCOUNTER — Telehealth: Payer: Self-pay | Admitting: Hematology and Oncology

## 2024-03-31 ENCOUNTER — Other Ambulatory Visit: Payer: Self-pay | Admitting: *Deleted

## 2024-03-31 ENCOUNTER — Inpatient Hospital Stay: Payer: Medicare Other | Attending: Hematology and Oncology | Admitting: Hematology and Oncology

## 2024-03-31 VITALS — BP 116/76 | HR 75 | Temp 97.9°F | Resp 18 | Ht 58.5 in | Wt 115.3 lb

## 2024-03-31 DIAGNOSIS — M810 Age-related osteoporosis without current pathological fracture: Secondary | ICD-10-CM | POA: Diagnosis not present

## 2024-03-31 DIAGNOSIS — Z923 Personal history of irradiation: Secondary | ICD-10-CM | POA: Diagnosis not present

## 2024-03-31 DIAGNOSIS — Z170 Estrogen receptor positive status [ER+]: Secondary | ICD-10-CM | POA: Diagnosis not present

## 2024-03-31 DIAGNOSIS — C50311 Malignant neoplasm of lower-inner quadrant of right female breast: Secondary | ICD-10-CM

## 2024-03-31 DIAGNOSIS — Z1732 Human epidermal growth factor receptor 2 negative status: Secondary | ICD-10-CM | POA: Diagnosis not present

## 2024-03-31 DIAGNOSIS — M818 Other osteoporosis without current pathological fracture: Secondary | ICD-10-CM

## 2024-03-31 DIAGNOSIS — Z1721 Progesterone receptor positive status: Secondary | ICD-10-CM | POA: Insufficient documentation

## 2024-03-31 NOTE — Telephone Encounter (Signed)
 Per 10/6 los to schedule in 2027.. template not open to schedule now, so pt will call in Jan 2026 to schedule then

## 2024-03-31 NOTE — Progress Notes (Signed)
 Patient Care Team: Antonio Meth, Jamee SAUNDERS, DO as PCP - General (Family Medicine) Oneita Na, MD as Referring Physician (Specialist) Lindbergh Sora, MD as Consulting Physician (Obstetrics and Gynecology) Jesus Oliphant, MD as Consulting Physician (Otolaryngology) Cleotilde Ronal RAMAN, MD as Consulting Physician (Gynecology) Odean Potts, MD as Consulting Physician (Hematology and Oncology) Izell Domino, MD as Attending Physician (Radiation Oncology) Arelia Filippo, MD as Consulting Physician (Plastic Surgery) Gail Favorite, MD as Consulting Physician (General Surgery)  DIAGNOSIS:  Encounter Diagnosis  Name Primary?   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, estrogen receptor positive (HCC)  12/24/2018 Initial Diagnosis   Palpable lump in the right breast, 2.1 cm, biopsy revealed grade 2 invasive ductal carcinoma that was ER 100%, PR 50%, Ki-67 50%, HER-2 negative, 2+ by IHC FISH ratio 1.27, T2N0 stage IB   12/24/2018 Cancer Staging   Staging form: Breast, AJCC 8th Edition - Clinical: Stage IB (cT2, cN0, cM0, G2, ER+, PR+, HER2-) - Signed by Izell Domino, MD on 12/24/2018   11/2018 - 11/2023 Anti-estrogen oral therapy   Anastrozole  1 mg   02/04/2019 Surgery   Right lumpectomy Warden): IDC, 2.5cm, grade 1, clear margins, and 3 lymph nodes negative.   02/04/2019 Cancer Staging   Staging form: Breast, AJCC 8th Edition - Pathologic stage from 02/04/2019: Stage IA (pT2, pN0, cM0, G1, ER+, PR+, HER2-, Oncotype DX score: 18) - Signed by Crawford Morna Pickle, NP on 02/19/2019   02/11/2019 Surgery   Bil Mammaplasty: benign   03/24/2019 - 04/14/2019 Radiation Therapy   The patient initially received a dose of 42.56 Gy in 16 fractions to the breast using whole-breast tangent fields. This was delivered using a 3-D  conformal technique. The total dose was 42.56 Gy.    Oncotype testing   The Oncotype DX score was 18 predicting a risk of outside the breast recurrence over the next 9 years of 5 % if the patient's only systemic therapy is tamoxifen for 5 years.   08/06/2019 Genetic Testing   Negative genetic testing on the common hereditary cancer panel.  The Common Hereditary Gene Panel offered by [facility] includes sequencing and/or deletion duplication testing of the following 48 genes: APC, ATM, AXIN2, BARD1, BMPR1A, BRCA1, BRCA2, BRIP1, CDH1, CDK4, CDKN2A (p14ARF), CDKN2A (p16INK4a), CHEK2, CTNNA1, DICER1, EPCAM (Deletion/duplication testing only), GREM1 (promoter region deletion/duplication testing only), KIT, MEN1, MLH1, MSH2, MSH3, MSH6, MUTYH, NBN, NF1, NHTL1, PALB2, PDGFRA, PMS2, POLD1, POLE, PTEN, RAD50, RAD51C, RAD51D, RNF43, SDHB, SDHC, SDHD, SMAD4, SMARCA4. STK11, TP53, TSC1, TSC2, and VHL.  The following genes were evaluated for sequence changes only: SDHA and HOXB13 c.251G>A variant only. The report date is August 06, 2019.     CHIEF COMPLIANT: Surveillance of breast cancer  HISTORY OF PRESENT ILLNESS:   History of Present Illness Kimberly Miles is a 68 year old with breast cancer who presents for follow-up regarding her breast cancer surveillance and bone health.  She is undergoing breast cancer surveillance with normal contrast mammogram results earlier this year. She experiences tenderness and firmness in the radiated breast, noticeable when touched or during heavy work, which she manages by massaging the area. She is undergoing Signatera blood tests as part of her surveillance and is confused about recurrence risk and the benefits of hormone therapy. She took hormone therapy pills for about three years, with benefits expected to last  several more years.  She is borderline osteoporotic and recently stopped taking collagen supplements due to concerns about potential cancer risk.  She participates in weekly [employer] workouts for bone strength. A recent REMS test showed stable results, and she plans to have a bone density test soon. She is cautious about taking estrogen due to her breast cancer history and is exploring other ways to manage her bone health without hormone therapy. No new breast pain or discomfort beyond the usual tenderness in the radiated breast.     ALLERGIES:  is allergic to codeine, demerol [meperidine], erythromycin, sulfa antibiotics, ampicillin, and chlorhexidine .  MEDICATIONS:  Current Outpatient Medications  Medication Sig Dispense Refill   Ascorbic Acid (VITAMIN C) 1000 MG tablet Take 1,000 mg by mouth daily. 2 tablet daily     b complex vitamins capsule Take 1 capsule by mouth daily.     Cholecalciferol (VITAMIN D3) 5000 UNITS CAPS Take 1 capsule by mouth daily.     magnesium oxide (MAG-OX) 400 (240 Mg) MG tablet Take 400 mg by mouth daily.     TURMERIC PO Take by mouth.     ipratropium (ATROVENT ) 0.06 % nasal spray Place 2 sprays into both nostrils 4 (four) times daily. (Patient not taking: Reported on 03/31/2024) 15 mL 12   No current facility-administered medications for this visit.    PHYSICAL EXAMINATION: ECOG PERFORMANCE STATUS: 1 - Symptomatic but completely ambulatory  Vitals:   03/31/24 0931  BP: 116/76  Pulse: 75  Resp: 18  Temp: 97.9 F (36.6 C)  SpO2: 100%   Filed Weights   03/31/24 0931  Weight: 115 lb 4.8 oz (52.3 kg)      LABORATORY DATA:  I have reviewed the data as listed    Latest Ref Rng & Units 07/19/2023    3:20 PM 09/08/2022    3:21 PM 02/24/2022   10:12 AM  CMP  Glucose 70 - 99 mg/dL 85  85  97   BUN 6 - 23 mg/dL 17  11  13    Creatinine 0.40 - 1.20 mg/dL 9.26  8.80  9.30   Sodium 135 - 145 mEq/L 139  141  140   Potassium 3.5 - 5.1 mEq/L 3.7  4.4  4.0   Chloride 96 - 112 mEq/L 104  105  105   CO2 19 - 32 mEq/L 26  25  31    Calcium 8.4 - 10.5 mg/dL 9.0  9.0  9.4   Total Protein 6.0 - 8.3 g/dL 6.3   6.6  6.7   Total Bilirubin 0.2 - 1.2 mg/dL 0.3  0.2  0.5   Alkaline Phos 39 - 117 U/L 86   109   AST 0 - 37 U/L 32  22  73   ALT 0 - 35 U/L 43  28  117     Lab Results  Component Value Date   WBC 7.9 07/19/2023   HGB 13.9 07/19/2023   HCT 42.4 07/19/2023   MCV 96.4 07/19/2023   PLT 245.0 07/19/2023   NEUTROABS 5.2 07/19/2023    ASSESSMENT & PLAN:  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  ER PR positive HER-2 negative, Ki-67 50%   Adjuvant radiation 03/25/2019-04/13/20 ----------------------------------------------------------------- Treatment plan: Adjuvant antiestrogen therapy with anastrozole  12/18/2019 discontinued because of continued muscle aches and pains Switched to letrozole  02/25/2020 half a tablet daily, switched to exemestane  01/24/21   Exemestane  toxicities: Tolerating it extremely well currently.   The treatment was held because of elevated liver function with the suspicion that it may be causing the elevation of LFTs. 09/08/2022: LFTs: Normal Therefore she discontinued exemestane    Breast cancer surveillance:  1. Mammogram 03/03/2024: Benign breast density category B  2. Breast MRI 09/03/2023 benign breast density category B:(will obtain annually contrast-enhanced mammograms instead of MRI), 3.  CT chest abdomen pelvis 09/17/2022: Benign cysts in the liver 4. Bone density 12/02/2019; T score -3 (takes osteostrong) Previously patient was offered  bisphosphonate therapy but she had refused. We will order another bone density test.   Osteoporosis: Patient wants to try osteogenic's exercise/specialized equipment program to improve bone density.  Follow-up in January 2027. Assessment & Plan Estrogen receptor positive right breast cancer in remission Remains in remission with no abnormalities on contrast mammogram and negative Signatera test. Concerned about collagen supplements' potential risk for recurrence. Not on hormone therapy; advised against estrogen. Declined Tamoxifen due to side effects. - Continue annual contrast mammogram, next scheduled for September 9th. - Continue Signatera testing every six months. - Discontinue collagen supplements. - Avoid estrogen therapy. - Discuss dietary modifications to reduce estrogen levels, such as reducing red meat intake.  Borderline osteoporosis/osteopenia Borderline for osteoporosis/osteopenia, managing bone health with OsteoStrong workouts and supplements. REMS test showed well-managed bone condition. Discussed bone-strengthening medications and advised against estrogen therapy. - Order bone density test (DEXA scan) to assess current bone health. - Continue OsteoStrong workouts. - Consider alternative bone supplements such as osteobiflex or glucosamine.      No orders of the defined types were placed in this encounter.  The patient has a good understanding of the overall plan. she agrees with it. she will call with any problems that may develop before the next visit here. Total time spent: 30 mins including face to face time and time spent for planning, charting and co-ordination of care   Naomi MARLA Chad, MD 03/31/24

## 2024-03-31 NOTE — Assessment & Plan Note (Signed)
 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%, Ki-67 50%, HER-2 negative, 2+ by IHC FISH ratio 1.27, Breast MRI: 2.3 cm right lower quadrant enhancement Left breast biopsy: Fibroadenoma Oncotype score 18: Distant recurrence at 9 years: 5% T2N0 stage IB Preoperative anastrozole  for 1 month   Right lumpectomy: 02/04/2019 Warden): IDC, 2.5cm, grade 1, clear margins, and 3 lymph nodes negative.  ER PR positive HER-2 negative, Ki-67 50%   Adjuvant radiation 03/25/2019-04/13/20 ----------------------------------------------------------------- Treatment plan: Adjuvant antiestrogen therapy with anastrozole  12/18/2019 discontinued because of continued muscle aches and pains Switched to letrozole  02/25/2020 half a tablet daily, switched to exemestane  01/24/21   Exemestane  toxicities: Tolerating it extremely well currently.   The treatment was held because of elevated liver function with the suspicion that it may be causing the elevation of LFTs. 09/08/2022: LFTs: Normal Therefore she discontinued exemestane    Breast cancer surveillance:  1. Mammogram 03/03/2024: Benign breast density category B  2. Breast MRI 09/03/2023 benign breast density category B:(will obtain annually contrast-enhanced mammograms instead of MRI), 3.  CT chest abdomen pelvis 09/17/2022: Benign cysts in the liver 4. Bone density 12/02/2019; T score -3 (takes osteostrong)   Osteoporosis: Patient wants to try osteogenic's exercise/specialized equipment program to improve bone density.

## 2024-04-01 NOTE — Progress Notes (Signed)
 Verbal order from Dr. Odean - Bone density exam at [facility] Mammography Order entered in chart. Order faxed to [facility]

## 2024-05-09 DIAGNOSIS — L821 Other seborrheic keratosis: Secondary | ICD-10-CM | POA: Diagnosis not present

## 2024-05-09 DIAGNOSIS — D2271 Melanocytic nevi of right lower limb, including hip: Secondary | ICD-10-CM | POA: Diagnosis not present

## 2024-05-09 DIAGNOSIS — L820 Inflamed seborrheic keratosis: Secondary | ICD-10-CM | POA: Diagnosis not present

## 2024-05-09 DIAGNOSIS — L720 Epidermal cyst: Secondary | ICD-10-CM | POA: Diagnosis not present

## 2024-05-09 DIAGNOSIS — Z85828 Personal history of other malignant neoplasm of skin: Secondary | ICD-10-CM | POA: Diagnosis not present

## 2024-05-09 DIAGNOSIS — L304 Erythema intertrigo: Secondary | ICD-10-CM | POA: Diagnosis not present

## 2024-05-09 DIAGNOSIS — D485 Neoplasm of uncertain behavior of skin: Secondary | ICD-10-CM | POA: Diagnosis not present

## 2024-05-09 DIAGNOSIS — L814 Other melanin hyperpigmentation: Secondary | ICD-10-CM | POA: Diagnosis not present

## 2024-05-09 DIAGNOSIS — D1801 Hemangioma of skin and subcutaneous tissue: Secondary | ICD-10-CM | POA: Diagnosis not present

## 2024-05-09 DIAGNOSIS — L570 Actinic keratosis: Secondary | ICD-10-CM | POA: Diagnosis not present

## 2024-05-17 DIAGNOSIS — N390 Urinary tract infection, site not specified: Secondary | ICD-10-CM | POA: Diagnosis not present

## 2024-05-17 DIAGNOSIS — R319 Hematuria, unspecified: Secondary | ICD-10-CM | POA: Diagnosis not present

## 2024-05-28 DIAGNOSIS — M810 Age-related osteoporosis without current pathological fracture: Secondary | ICD-10-CM | POA: Diagnosis not present

## 2024-05-28 LAB — HM DEXA SCAN

## 2024-05-29 ENCOUNTER — Encounter: Payer: Self-pay | Admitting: Family Medicine

## 2024-05-29 ENCOUNTER — Telehealth: Payer: Self-pay | Admitting: Hematology and Oncology

## 2024-05-29 ENCOUNTER — Telehealth: Payer: Self-pay | Admitting: *Deleted

## 2024-05-29 NOTE — Telephone Encounter (Signed)
 Received bone density report from [facility] showing t score -2.9 which has improved.  Previously t score -3.0.  Pt states she is still working with [employer] and would like to continue with that and hold off on any medications at this time.  MD notified and verbalized understanding.

## 2024-05-29 NOTE — Telephone Encounter (Signed)
 left vm about scheduled f/u appt date and time. Encouraged to call back if need to reschedule

## 2024-06-03 DIAGNOSIS — Z85828 Personal history of other malignant neoplasm of skin: Secondary | ICD-10-CM | POA: Diagnosis not present

## 2024-06-03 DIAGNOSIS — L820 Inflamed seborrheic keratosis: Secondary | ICD-10-CM | POA: Diagnosis not present

## 2024-06-04 ENCOUNTER — Ambulatory Visit: Payer: Self-pay | Admitting: Family Medicine

## 2024-06-05 MED ORDER — ALENDRONATE SODIUM 70 MG PO TABS: 70.0000 mg | ORAL_TABLET | ORAL | 3 refills | Status: AC

## 2024-07-01 ENCOUNTER — Inpatient Hospital Stay: Payer: PRIVATE HEALTH INSURANCE | Admitting: Hematology and Oncology

## 2025-04-01 ENCOUNTER — Ambulatory Visit: Payer: PRIVATE HEALTH INSURANCE | Admitting: Hematology and Oncology

## 2025-06-30 ENCOUNTER — Inpatient Hospital Stay: Payer: PRIVATE HEALTH INSURANCE | Admitting: Hematology and Oncology
